# Patient Record
Sex: Male | Born: 1965
Health system: Southern US, Community
[De-identification: ages and names within clinical notes are randomized; demographics above are authoritative.]

## PROBLEM LIST (undated history)

## (undated) DIAGNOSIS — Z9989 Dependence on other enabling machines and devices: Secondary | ICD-10-CM

## (undated) DIAGNOSIS — Z8614 Personal history of Methicillin resistant Staphylococcus aureus infection: Secondary | ICD-10-CM

## (undated) DIAGNOSIS — N209 Urinary calculus, unspecified: Secondary | ICD-10-CM

## (undated) DIAGNOSIS — G4730 Sleep apnea, unspecified: Secondary | ICD-10-CM

## (undated) DIAGNOSIS — G4733 Obstructive sleep apnea (adult) (pediatric): Secondary | ICD-10-CM

## (undated) DIAGNOSIS — K649 Unspecified hemorrhoids: Secondary | ICD-10-CM

## (undated) DIAGNOSIS — S83209A Unspecified tear of unspecified meniscus, current injury, unspecified knee, initial encounter: Secondary | ICD-10-CM

## (undated) DIAGNOSIS — T7840XA Allergy, unspecified, initial encounter: Secondary | ICD-10-CM

## (undated) DIAGNOSIS — K219 Gastro-esophageal reflux disease without esophagitis: Secondary | ICD-10-CM

## (undated) DIAGNOSIS — K824 Cholesterolosis of gallbladder: Secondary | ICD-10-CM

## (undated) HISTORY — DX: Sleep apnea, unspecified: G47.30

## (undated) HISTORY — PX: CYSTOSCOPY/RETROGRADE/URETEROSCOPY/STONE EXTRACTION WITH BASKET: SHX5317

## (undated) HISTORY — DX: Allergy, unspecified, initial encounter: T78.40XA

## (undated) HISTORY — DX: Cholesterolosis of gallbladder: K82.4

## (undated) HISTORY — DX: Obstructive sleep apnea (adult) (pediatric): G47.33

## (undated) HISTORY — DX: Gastro-esophageal reflux disease without esophagitis: K21.9

## (undated) HISTORY — DX: Urinary calculus, unspecified: N20.9

## (undated) HISTORY — DX: Unspecified hemorrhoids: K64.9

## (undated) HISTORY — DX: Obstructive sleep apnea (adult) (pediatric): Z99.89

## (undated) HISTORY — PX: OTHER SURGICAL HISTORY: SHX169

## (undated) HISTORY — DX: Unspecified tear of unspecified meniscus, current injury, unspecified knee, initial encounter: S83.209A

---

## 2002-05-25 ENCOUNTER — Emergency Department (HOSPITAL_COMMUNITY): Admission: EM | Admit: 2002-05-25 | Discharge: 2002-05-26 | Payer: Self-pay | Admitting: Emergency Medicine

## 2004-05-19 ENCOUNTER — Ambulatory Visit: Payer: Self-pay | Admitting: Internal Medicine

## 2004-06-13 ENCOUNTER — Ambulatory Visit: Payer: Self-pay | Admitting: Gastroenterology

## 2004-07-03 ENCOUNTER — Ambulatory Visit: Payer: Self-pay | Admitting: Gastroenterology

## 2004-08-01 ENCOUNTER — Ambulatory Visit: Payer: Self-pay | Admitting: Gastroenterology

## 2004-08-11 ENCOUNTER — Ambulatory Visit: Payer: Self-pay | Admitting: Gastroenterology

## 2004-09-05 ENCOUNTER — Ambulatory Visit: Payer: Self-pay | Admitting: Gastroenterology

## 2010-08-16 DIAGNOSIS — S83209A Unspecified tear of unspecified meniscus, current injury, unspecified knee, initial encounter: Secondary | ICD-10-CM

## 2010-08-16 HISTORY — DX: Unspecified tear of unspecified meniscus, current injury, unspecified knee, initial encounter: S83.209A

## 2010-09-19 ENCOUNTER — Encounter: Payer: Self-pay | Admitting: Internal Medicine

## 2010-09-19 ENCOUNTER — Ambulatory Visit (INDEPENDENT_AMBULATORY_CARE_PROVIDER_SITE_OTHER): Payer: 59 | Admitting: Internal Medicine

## 2010-09-19 VITALS — BP 128/72 | HR 115 | Temp 97.6°F | Resp 14 | Ht 74.0 in | Wt 264.2 lb

## 2010-09-19 DIAGNOSIS — N200 Calculus of kidney: Secondary | ICD-10-CM | POA: Insufficient documentation

## 2010-09-19 DIAGNOSIS — S83209A Unspecified tear of unspecified meniscus, current injury, unspecified knee, initial encounter: Secondary | ICD-10-CM

## 2010-09-19 DIAGNOSIS — N209 Urinary calculus, unspecified: Secondary | ICD-10-CM

## 2010-09-19 DIAGNOSIS — Z136 Encounter for screening for cardiovascular disorders: Secondary | ICD-10-CM

## 2010-09-19 DIAGNOSIS — Z0000 Encounter for general adult medical examination without abnormal findings: Secondary | ICD-10-CM

## 2010-09-19 NOTE — Patient Instructions (Addendum)
Please come back fasting within a week: Udip and UA, FLP CMP CBC TSH---dx V70

## 2010-09-19 NOTE — Assessment & Plan Note (Signed)
To have surgery on the knee

## 2010-09-19 NOTE — Progress Notes (Signed)
  Subjective:    Patient ID: Mario Jimenez, male    DOB: [birth year], 45 y.o.   MRN: [id number]  HPI CPX, new pt  To have knee surgery soon  Past Medical History  Diagnosis Date  . Urolithiasis 2007-2008    ([place] left kidney  3 surgeries ; has passed stones since then   . Meniscus tear 8-12    to have surgery   Past Surgical History  Procedure Date  . Leg surgery 1977    Left , s/p traction, body cast   History   Social History  . Marital Status: Single    Spouse Name: N/A    Number of Children: 0  . Years of Education: N/A   Occupational History  . works for [employer]  [employer])    Social History Main Topics  . Smoking status: Former Smoker    Quit date: 09/19/2007  . Smokeless tobacco: Never Used  . Alcohol Use: Yes     social  . Drug Use: No  . Sexually Active: Not on file   Other Topics Concern  . Not on file   Social History Narrative   From [place], used to live in [place] x a while, get back to [place] 2009----diet: needs improvement----exercise: active at work---lives by himself    Family History  Problem Relation Age of Onset  . Diabetes Neg Hx   . Coronary artery disease      F, MI 45 y/o  . Stroke      M d/t brain tumor   . Cancer      M brain tumor  . Colon cancer      G parents , 11s?  . Prostate cancer Neg Hx       Review of Systems  Constitutional: Negative for fever. Fatigue: energy level ok , could "use more"       Occ sleepy "all life"  Respiratory: Negative for cough and shortness of breath.   Cardiovascular: Negative for chest pain and leg swelling.  Gastrointestinal: Negative for nausea, abdominal pain and blood in stool.  Genitourinary: Negative for dysuria and hematuria.  Psychiatric/Behavioral:       No depression       Objective:   Physical Exam  Constitutional: He is oriented to person, place, and time. He appears well-developed. No distress.       Overweight appearing   HENT:  Head: Normocephalic and atraumatic.  Neck: No  thyromegaly present.  Cardiovascular: Normal rate, regular rhythm and normal heart sounds.   No murmur heard. Pulmonary/Chest: Effort normal and breath sounds normal. No respiratory distress. He has no wheezes. He has no rales.  Abdominal: Soft. Bowel sounds are normal. He exhibits no distension. There is no tenderness. There is no rebound and no guarding.  Musculoskeletal: He exhibits no edema.  Neurological: He is alert and oriented to person, place, and time.  Skin: Skin is warm and dry. He is not diaphoretic.  Psychiatric: He has a normal mood and affect. His behavior is normal. Judgment and thought content normal.          Assessment & Plan:

## 2010-09-19 NOTE — Assessment & Plan Note (Addendum)
Td ~ 2006 (in [place] per pt) Reports a Cscope 2005 @ [place] (d/t diverticulitis?)--- will get records  EKG today show normal sinus rhythm, no old EKGs to compare with. Diet and exercise discussed, being I discussed as well. Definitely needs to lose weight.  We'll return to the office fasting for labs. Initially heart rate was elevated , repeated HR  in the 80s

## 2010-09-21 ENCOUNTER — Other Ambulatory Visit (INDEPENDENT_AMBULATORY_CARE_PROVIDER_SITE_OTHER): Payer: 59

## 2010-09-21 DIAGNOSIS — Z0000 Encounter for general adult medical examination without abnormal findings: Secondary | ICD-10-CM

## 2010-09-21 LAB — COMPREHENSIVE METABOLIC PANEL
ALT: 17 U/L (ref 0–53)
Albumin: 3.8 g/dL (ref 3.5–5.2)
Alkaline Phosphatase: 65 U/L (ref 39–117)
CO2: 29 mEq/L (ref 19–32)
GFR: 94.28 mL/min (ref >60.00)
Glucose, Bld: 98 mg/dL (ref 70–99)
Potassium: 4 mEq/L (ref 3.5–5.1)
Sodium: 138 mEq/L (ref 135–145)
Total Protein: 7.1 g/dL (ref 6.0–8.3)

## 2010-09-21 LAB — POCT URINALYSIS DIPSTICK
Leukocytes, UA: NEGATIVE
Protein, UA: NEGATIVE
Urobilinogen, UA: 0.2

## 2010-09-21 LAB — CBC WITH DIFFERENTIAL/PLATELET
Basophils Absolute: 0 10*3/uL (ref 0.0–0.1)
Eosinophils Relative: 2.6 % (ref 0.0–5.0)
Lymphs Abs: 2.6 10*3/uL (ref 0.7–4.0)
MCV: 89.1 fl (ref 78.0–100.0)
Monocytes Absolute: 0.4 10*3/uL (ref 0.1–1.0)
Neutrophils Relative %: 64.5 % (ref 43.0–77.0)
Platelets: 172 10*3/uL (ref 150.0–400.0)
RDW: 13.7 % (ref 11.5–14.6)
WBC: 9.4 10*3/uL (ref 4.5–10.5)

## 2010-09-21 LAB — LIPID PANEL: Total CHOL/HDL Ratio: 4

## 2010-09-21 LAB — LDL CHOLESTEROL, DIRECT: Direct LDL: 81.2 mg/dL

## 2010-09-21 LAB — TSH: TSH: 1.72 u[IU]/mL (ref 0.35–5.50)

## 2010-09-21 NOTE — Progress Notes (Signed)
Labs only

## 2010-09-23 LAB — CULTURE, URINE COMPREHENSIVE: Colony Count: NO GROWTH

## 2010-12-01 ENCOUNTER — Encounter (HOSPITAL_BASED_OUTPATIENT_CLINIC_OR_DEPARTMENT_OTHER): Payer: Self-pay | Admitting: *Deleted

## 2010-12-01 NOTE — Progress Notes (Signed)
NPO AFTER NPO. PT NEEDS HG. CURRENT EKG IN EPIC. PT TO ARRIVE AT 1030

## 2010-12-05 ENCOUNTER — Ambulatory Visit (HOSPITAL_BASED_OUTPATIENT_CLINIC_OR_DEPARTMENT_OTHER): Payer: 59 | Admitting: Anesthesiology

## 2010-12-05 ENCOUNTER — Ambulatory Visit (HOSPITAL_BASED_OUTPATIENT_CLINIC_OR_DEPARTMENT_OTHER)
Admission: RE | Admit: 2010-12-05 | Discharge: 2010-12-05 | Disposition: A | Discharge status: Home / Self Care | Payer: 59 | Source: Ambulatory Visit | Attending: Orthopedic Surgery | Admitting: Orthopedic Surgery

## 2010-12-05 ENCOUNTER — Encounter (HOSPITAL_BASED_OUTPATIENT_CLINIC_OR_DEPARTMENT_OTHER): Payer: Self-pay | Admitting: *Deleted

## 2010-12-05 ENCOUNTER — Encounter (HOSPITAL_BASED_OUTPATIENT_CLINIC_OR_DEPARTMENT_OTHER): Payer: Self-pay | Admitting: Anesthesiology

## 2010-12-05 ENCOUNTER — Encounter (HOSPITAL_BASED_OUTPATIENT_CLINIC_OR_DEPARTMENT_OTHER)
Admission: RE | Disposition: A | Discharge status: Home / Self Care | Payer: Self-pay | Source: Ambulatory Visit | Attending: Orthopedic Surgery

## 2010-12-05 DIAGNOSIS — M659 Unspecified synovitis and tenosynovitis, unspecified site: Secondary | ICD-10-CM | POA: Insufficient documentation

## 2010-12-05 DIAGNOSIS — M23319 Other meniscus derangements, anterior horn of medial meniscus, unspecified knee: Secondary | ICD-10-CM | POA: Insufficient documentation

## 2010-12-05 DIAGNOSIS — S83259A Bucket-handle tear of lateral meniscus, current injury, unspecified knee, initial encounter: Secondary | ICD-10-CM | POA: Insufficient documentation

## 2010-12-05 DIAGNOSIS — M1710 Unilateral primary osteoarthritis, unspecified knee: Secondary | ICD-10-CM | POA: Insufficient documentation

## 2010-12-05 HISTORY — DX: Personal history of Methicillin resistant Staphylococcus aureus infection: Z86.14

## 2010-12-05 HISTORY — PX: KNEE ARTHROSCOPY: SHX127

## 2010-12-05 LAB — POCT HEMOGLOBIN-HEMACUE: Hemoglobin: 15.5 g/dL (ref 13.0–17.0)

## 2010-12-05 SURGERY — ARTHROSCOPY, KNEE
Anesthesia: General | Site: Knee | Laterality: Left | Wound class: Clean

## 2010-12-05 MED ORDER — OXYCODONE-ACETAMINOPHEN 5-325 MG PO TABS
1.0000 | ORAL_TABLET | ORAL | Status: DC | PRN
  Administered 2010-12-05: 1 via ORAL

## 2010-12-05 MED ORDER — SODIUM CHLORIDE 0.9 % IR SOLN
Status: DC | PRN
  Administered 2010-12-05: 14:00:00

## 2010-12-05 MED ORDER — BUPIVACAINE-EPINEPHRINE 0.5% -1:200000 IJ SOLN
INTRAMUSCULAR | Status: DC | PRN
  Administered 2010-12-05: 20 mL

## 2010-12-05 MED ORDER — PROMETHAZINE HCL 25 MG/ML IJ SOLN: 6.2500 mg | INTRAMUSCULAR | Status: DC | PRN

## 2010-12-05 MED ORDER — MIDAZOLAM HCL 5 MG/5ML IJ SOLN
INTRAMUSCULAR | Status: DC | PRN
  Administered 2010-12-05 (×2): 1 mg via INTRAVENOUS

## 2010-12-05 MED ORDER — PROMETHAZINE HCL 12.5 MG PO TABS: 12.5000 mg | ORAL_TABLET | Freq: Four times a day (QID) | ORAL | Status: AC | PRN

## 2010-12-05 MED ORDER — SODIUM CHLORIDE 0.9 % IR SOLN
Status: DC | PRN
  Administered 2010-12-05: 3000 mL

## 2010-12-05 MED ORDER — PROPOFOL 10 MG/ML IV EMUL
INTRAVENOUS | Status: DC | PRN
  Administered 2010-12-05: 200 mg via INTRAVENOUS

## 2010-12-05 MED ORDER — LACTATED RINGERS IV SOLN
INTRAVENOUS | Status: DC
  Administered 2010-12-05 (×4): via INTRAVENOUS

## 2010-12-05 MED ORDER — ALBUTEROL SULFATE HFA 108 (90 BASE) MCG/ACT IN AERS
INHALATION_SPRAY | RESPIRATORY_TRACT | Status: DC | PRN
  Administered 2010-12-05: 1 via RESPIRATORY_TRACT
  Administered 2010-12-05: 2 via RESPIRATORY_TRACT

## 2010-12-05 MED ORDER — LIDOCAINE HCL (CARDIAC) 20 MG/ML IV SOLN
INTRAVENOUS | Status: DC | PRN
  Administered 2010-12-05: 60 mg via INTRAVENOUS

## 2010-12-05 MED ORDER — FENTANYL CITRATE 0.05 MG/ML IJ SOLN
25.0000 ug | INTRAMUSCULAR | Status: DC | PRN
  Administered 2010-12-05: 25 ug via INTRAVENOUS
  Administered 2010-12-05: 50 ug via INTRAVENOUS
  Administered 2010-12-05: 25 ug via INTRAVENOUS

## 2010-12-05 MED ORDER — OXYCODONE-ACETAMINOPHEN 7.5-325 MG PO TABS: 1.0000 | ORAL_TABLET | ORAL | Status: AC | PRN

## 2010-12-05 MED ORDER — FENTANYL CITRATE 0.05 MG/ML IJ SOLN
INTRAMUSCULAR | Status: DC | PRN
  Administered 2010-12-05 (×2): 25 ug via INTRAVENOUS
  Administered 2010-12-05 (×3): 50 ug via INTRAVENOUS

## 2010-12-05 MED ORDER — DEXAMETHASONE SODIUM PHOSPHATE 4 MG/ML IJ SOLN
INTRAMUSCULAR | Status: DC | PRN
  Administered 2010-12-05: 8 mg via INTRAVENOUS

## 2010-12-05 SURGICAL SUPPLY — 34 items
BANDAGE ELASTIC 6 VELCRO ST LF (GAUZE/BANDAGES/DRESSINGS) ×2 IMPLANT
BANDAGE ESMARK 6X9 LF (GAUZE/BANDAGES/DRESSINGS) ×1 IMPLANT
BANDAGE GAUZE ELAST BULKY 4 IN (GAUZE/BANDAGES/DRESSINGS) ×2 IMPLANT
BLADE CUDA SHAVER 3.5 (BLADE) ×2 IMPLANT
BNDG ESMARK 6X9 LF (GAUZE/BANDAGES/DRESSINGS) ×2
CANISTER SUCT LVC 12 LTR MEDI- (MISCELLANEOUS) ×2 IMPLANT
CLOTH BEACON ORANGE TIMEOUT ST (SAFETY) ×2 IMPLANT
DRAPE ARTHROSCOPY W/POUCH 114 (DRAPES) ×2 IMPLANT
DRAPE LG THREE QUARTER DISP (DRAPES) ×2 IMPLANT
DRSG EMULSION OIL 3X3 NADH (GAUZE/BANDAGES/DRESSINGS) ×2 IMPLANT
DURAPREP 26ML APPLICATOR (WOUND CARE) ×2 IMPLANT
ELECT REM PT RETURN 9FT ADLT (ELECTROSURGICAL) ×2
ELECTRODE REM PT RTRN 9FT ADLT (ELECTROSURGICAL) ×1 IMPLANT
GLOVE BIOGEL PI IND STRL 6.5 (GLOVE) ×1 IMPLANT
GLOVE BIOGEL PI INDICATOR 6.5 (GLOVE) ×1
GLOVE ECLIPSE 8.0 STRL XLNG CF (GLOVE) ×2 IMPLANT
GLOVE INDICATOR 8.0 STRL GRN (GLOVE) ×2 IMPLANT
GOWN W/COTTON TOWEL STD LRG (GOWNS) ×2 IMPLANT
GOWN XL W/COTTON TOWEL STD (GOWNS) ×2 IMPLANT
IV NS IRRIG 3000ML ARTHROMATIC (IV SOLUTION) ×6 IMPLANT
KNEE WRAP E Z 3 GEL PACK (MISCELLANEOUS) ×2 IMPLANT
NDL SAFETY ECLIPSE 18X1.5 (NEEDLE) ×1 IMPLANT
NEEDLE HYPO 18GX1.5 BLUNT FILL (NEEDLE) ×2 IMPLANT
NEEDLE HYPO 18GX1.5 SHARP (NEEDLE) ×1
PACK ARTHROSCOPY DSU (CUSTOM PROCEDURE TRAY) ×2 IMPLANT
PACK BASIN DAY SURGERY FS (CUSTOM PROCEDURE TRAY) ×2 IMPLANT
PADDING CAST ABS 4INX4YD NS (CAST SUPPLIES) ×1
PADDING CAST ABS COTTON 4X4 ST (CAST SUPPLIES) ×1 IMPLANT
SET ARTHROSCOPY TUBING (MISCELLANEOUS) ×2
SET ARTHROSCOPY TUBING LN (MISCELLANEOUS) ×1 IMPLANT
SPONGE GAUZE 4X4 12PLY (GAUZE/BANDAGES/DRESSINGS) ×2 IMPLANT
SYRINGE 10CC LL (SYRINGE) ×2 IMPLANT
TOWEL OR 17X24 6PK STRL BLUE (TOWEL DISPOSABLE) ×2 IMPLANT
WATER STERILE IRR 500ML POUR (IV SOLUTION) ×2 IMPLANT

## 2010-12-05 NOTE — H&P (Signed)
Mario Jimenez is an 45 y.o. male.   Chief Complaint: pain lt knee  HPI: mri--lateral meniscus  Past Medical History  Diagnosis Date  . Urolithiasis 2007-2008    [place]  3 surgeries ; has passed stones since then   . Meniscus tear 8-12    to have surgery-- left knee  . Reflux occasional    watches diet and takes diet  . Asthma mild    no inhaler  . History of MRSA infection few yrs ago while in hospital in [place]    Past Surgical History  Procedure Date  . Leg surgery 1977- age 59    Left , s/p traction, body cast  . Cystoscopy/retrograde/ureteroscopy/stone extraction with basket 2007-- x2    Family History  Problem Relation Age of Onset  . Diabetes Neg Hx   . Coronary artery disease      F, MI 45 y/o  . Stroke      M d/t brain tumor   . Cancer      M brain tumor  . Colon cancer      G parents , 33s?  . Prostate cancer Neg Hx    Social History:  reports that he quit smoking about 3 years ago. He has never used smokeless tobacco. He reports that he drinks alcohol. He reports that he does not use illicit drugs.  Allergies:  Allergies  Allergen Reactions  . Contrast Media (Iodinated Diagnostic Agents) Hives    Medications Prior to Admission  Medication Dose Route Frequency Provider Last Rate Last Dose  . lactated ringers infusion   Intravenous Continuous Jill Side, MD       No current outpatient prescriptions on file as of 12/05/2010.    Results for orders placed during the hospital encounter of 12/05/10 (from the past 48 hour(s))  POCT HEMOGLOBIN-HEMACUE     Status: Normal   Collection Time   12/05/10 11:11 AM      Component Value Range Comment   Hemoglobin 15.5  13.0 - 17.0 (g/dL)    No results found.  ROS  Blood pressure 123/77, pulse 85, temperature 97.7 F (36.5 C), temperature source Oral, resp. rate 18, height 6\' 2"  (1.88 m), weight 117.935 kg (260 lb), SpO2 97.00%. Physical Exam  Constitutional: He is oriented to person, place,  and time. He appears well-developed and well-nourished.  HENT:  Head: Normocephalic.  Right Ear: External ear normal.  Left Ear: External ear normal.  Nose: Nose normal.  Eyes: Pupils are equal, round, and reactive to light.  Neck: Normal range of motion. Neck supple.  Cardiovascular: Normal rate, regular rhythm, normal heart sounds and intact distal pulses.   Respiratory: Effort normal and breath sounds normal.  GI: Soft. Bowel sounds are normal.  Musculoskeletal:       Tender lateral joint line lt knee  Neurological: He is alert and oriented to person, place, and time.  Skin: Skin is warm and dry.  Psychiatric: He has a normal mood and affect. His behavior is normal. Judgment and thought content normal.     Assessment/Plan Torn lateral meniscus lt knee Lt knee arthroscopy  Aysa Larivee P 12/05/2010, 1:05 PM

## 2010-12-05 NOTE — Anesthesia Postprocedure Evaluation (Signed)
  Anesthesia Post-op Note  Patient: Mario Jimenez  Procedure(s) Performed:  ARTHROSCOPY KNEE - LEFT KNEE ARTHROSCOPY WITH PARTIAL MEDIAL  MENISECTOMY, Total lateral menisectomy, Shaving of Medial Femoral condyle  Patient Location: PACU  Anesthesia Type: General  Level of Consciousness: awake and alert   Airway and Oxygen Therapy: Patient Spontanous Breathing  Post-op Pain: mild  Post-op Assessment: Post-op Vital signs reviewed, Patient's Cardiovascular Status Stable, Respiratory Function Stable, Patent Airway and No signs of Nausea or vomiting  Post-op Vital Signs: stable  Complications: No apparent anesthesia complications

## 2010-12-05 NOTE — Anesthesia Preprocedure Evaluation (Signed)
Anesthesia Evaluation  Patient identified by MRN, date of birth, ID band Patient awake    Reviewed: Allergy & Precautions, H&P , NPO status , Patient's Chart, lab work & pertinent test results, reviewed documented beta blocker date and time   Airway Mallampati: II TM Distance: >3 FB Neck ROM: Full    Dental No notable dental hx.    Pulmonary neg pulmonary ROS,  clear to auscultation        Cardiovascular neg cardio ROS Regular Normal Denies cardiac symptoms   Neuro/Psych Negative Neurological ROS  Negative Psych ROS   GI/Hepatic negative GI ROS, Neg liver ROS,   Endo/Other  obesity  Renal/GU negative Renal ROS  Genitourinary negative   Musculoskeletal negative musculoskeletal ROS (+)   Abdominal   Peds negative pediatric ROS (+)  Hematology negative hematology ROS (+)   Anesthesia Other Findings   Reproductive/Obstetrics negative OB ROS                           Anesthesia Physical Anesthesia Plan  ASA: II  Anesthesia Plan: General   Post-op Pain Management:    Induction: Intravenous  Airway Management Planned: LMA  Additional Equipment:   Intra-op Plan:   Post-operative Plan: Extubation in OR  Informed Consent: I have reviewed the patients History and Physical, chart, labs and discussed the procedure including the risks, benefits and alternatives for the proposed anesthesia with the patient or authorized representative who has indicated his/her understanding and acceptance.     Plan Discussed with: CRNA and Surgeon  Anesthesia Plan Comments:         Anesthesia Quick Evaluation

## 2010-12-05 NOTE — Anesthesia Procedure Notes (Signed)
Procedure Name: LMA Insertion Date/Time: 12/05/2010 1:22 PM Performed by: Lorrin Jackson Pre-anesthesia Checklist: Patient identified, Emergency Drugs available, Suction available and Patient being monitored Patient Re-evaluated:Patient Re-evaluated prior to inductionOxygen Delivery Method: Circle System Utilized Preoxygenation: Pre-oxygenation with 100% oxygen Intubation Type: IV induction Ventilation: Mask ventilation without difficulty LMA: LMA inserted LMA Size: 4.0 Number of attempts: 1 Airway Equipment and Method: bite block Placement Confirmation: positive ETCO2 Tube secured with: Tape Dental Injury: Teeth and Oropharynx as per pre-operative assessment  Comments: Inserted by dr. Shireen Quan

## 2010-12-05 NOTE — Brief Op Note (Signed)
12/05/2010  2:30 PM  PATIENT:  Mario Jimenez  44 y.o. male  PRE-OPERATIVE DIAGNOSIS:  LEFT KNEE TORN LATERAL MENISCUS  POST-OPERATIVE DIAGNOSIS:  LEFT KNEE TORN LATERAL MENISCUS, Left torn medial meniscus  PROCEDURE:  Procedure(s): ARTHROSCOPY KNEE  SURGEON:  Surgeon(s): James P Aplington  PHYSICIAN ASSISTANT:   ASSISTANTS: none   ANESTHESIA:   general  EBL:  Total I/O In: 1000 [I.V.:1000] Out: -   BLOOD ADMINISTERED:none  DRAINS: none   LOCAL MEDICATIONS USED:  MARCAINE 20CC  SPECIMEN:  No Specimen  DISPOSITION OF SPECIMEN:  N/A  COUNTS:  YES  TOURNIQUET:   Total Tourniquet Time Documented: Thigh (Left) - 0 minutes  DICTATION: .Other Dictation: Dictation Number [id number]  PLAN OF CARE: Discharge to home after PACU  PATIENT DISPOSITION:  PACU - hemodynamically stable.   Delay start of Pharmacological VTE agent (>24hrs) due to surgical blood loss or risk of bleeding:  {YES/NO/NOT APPLICABLE:20182

## 2010-12-05 NOTE — Transfer of Care (Signed)
Immediate Anesthesia Transfer of Care Note  Patient: Mario Jimenez  Procedure(s) Performed:  ARTHROSCOPY KNEE - LEFT KNEE ARTHROSCOPY WITH PARTIAL MEDIAL  MENISECTOMY, Total lateral menisectomy, Shaving of Medial Femoral condyle  Patient Location: Patient transported to PACU with oxygen via face mask at 6 Liters / Min  Anesthesia Type: General  Level of Consciousness: awake and alert   Airway & Oxygen Therapy: Patient Spontanous Breathing and Patient connected to face mask oxygen Post-op Assessment: Report given to PACU RN and Post -op Vital signs reviewed and stable  Post vital signs: Reviewed and stable  Complications: No apparent anesthesia complications

## 2010-12-06 NOTE — Op Note (Signed)
NAMENEWTON, FRUTIGER                ACCOUNT NO.:  [id number]  MEDICAL RECORD NO.:  [id number]  LOCATION:                                 FACILITY:  PHYSICIAN:  Marlowe Kays, M.D.  DATE OF BIRTH:  [birth year]  DATE OF PROCEDURE:  12/05/2010 DATE OF DISCHARGE:                              OPERATIVE REPORT   PREOPERATIVE DIAGNOSES: 1. Torn lateral meniscus. 2. Osteoarthritis, left knee.  POSTOPERATIVE DIAGNOSES: 1. Torn lateral meniscus. 2. Osteoarthritis, left knee. 3. Torn medial meniscus, left knee.  OPERATION:  Left knee arthroscopy with: 1. Partial medial and lateral meniscectomies. 2. Shaving of lateral femoral condyle.  SURGEON:  Marlowe Kays, M.D.  ASSISTANT:  Nurse.  ANESTHESIA:  General.  PATHOLOGY JUSTIFICATION FOR PROCEDURE:  Because of painful left knee, had an MRI performed on August 31, 2010, which demonstrated the preoperative diagnoses.  At surgery, it was also noted to have fraying of the inner anterior 3rd of the medial meniscus.  Basically, had no arthritic changes in the medial compartment, some minimal flattening of the lateral facet of the patella and some grade 1 to 2 chondromalacia of the lateral femoral condyle.  The lateral meniscus tear, however, was essentially a double bucket-handle tear as described below.  PROCEDURE IN DETAIL:  Satisfactory general anesthesia, Ace wrap, and knee support to right lower extremity, pneumatic tourniquet applied to left lower extremity with the leg Esmarch'd out nonsterilely. Tourniquet inflated to 350 mmHg and thigh stabilizer applied.  Left leg was then prepped with DuraPrep from stabilizer and ankle, draped in sterile field.  Time-out performed.  Superior and medial saline inflow, first through an anterolateral portal, medial compartment knee joint was evaluated with the findings noted above.  After evaluating the medial compartment of knee joint, I shaved the anterior third of the medial meniscus  __________ smooth with 3.5 shaver.  Pre and post films were taken.  __________ was noted to be intact.  Looking at the medial gutter and suprapatellar area, no surgery was indicated with the findings noted above.  I then reversed portals.  He had a good bit of synovitis, which I cleared with 3.5 shaver, and the remnant of the anterior horn, which was still attached, he had an early bucket-handle type tear, and then just deeper to this, a frank bucket-handle type tear.  It almost looked like he had a discoid lateral meniscus.  After evaluating the complexity of the tear, I took small arthroscopic scissors and detached the anterior portion of the lateral meniscus from the anterior bucket-handle portion back to the residual rim and in junction of mid and posterior 3rd.  I then removed the anterior portion of this complex with a combination of 3.5 shaver and baskets, leaving a large fragment in the posterior intercondylar area.  I approached this by using an additional accessory portal and pulling on the freed up anterior portion of the lateral meniscus with the scrub nurse holding here, I used arthroscopic scissors to cut the posterior intercondylar root.  This fragment was then removed in total with a low additional trimming up __________ baskets of the remaining stump.  This basically performed almost a total lateral meniscectomy.  On probing,  there was no residual meniscus, loose to come into the joint.  I minimally shaved the lateral femoral condyle and wound was then irrigated with sterile saline.  All fluid possible removed.  I closed all portals except for the inflow with interrupted 4- 0 nylon mattress sutures.  I then injected through the inflow apparatus, 20 cc of 0.5% Marcaine with adrenaline, 4 mg of morphine, and closed this portal with 4-0 nylon as well.  Betadine, Adaptic, dry sterile dressing were applied.  Tourniquet was released.  At the time of this dictation, he was on his  way to recovery room in satisfied condition with no known complications.          ______________________________ Marlowe Kays, M.D.     JA/MEDQ  D:  12/05/2010  T:  12/05/2010  Job:  [id number]

## 2010-12-12 ENCOUNTER — Encounter (HOSPITAL_BASED_OUTPATIENT_CLINIC_OR_DEPARTMENT_OTHER): Payer: Self-pay | Admitting: Orthopedic Surgery

## 2011-01-15 ENCOUNTER — Encounter: Payer: Self-pay | Admitting: Internal Medicine

## 2011-01-15 ENCOUNTER — Ambulatory Visit (INDEPENDENT_AMBULATORY_CARE_PROVIDER_SITE_OTHER): Payer: 59 | Admitting: Internal Medicine

## 2011-01-15 VITALS — BP 106/68 | HR 87 | Temp 98.0°F | Wt 268.2 lb

## 2011-01-15 DIAGNOSIS — J069 Acute upper respiratory infection, unspecified: Secondary | ICD-10-CM

## 2011-01-15 MED ORDER — MOMETASONE FUROATE 50 MCG/ACT NA SUSP: 2.0000 | Freq: Every day | NASAL | Status: DC

## 2011-01-15 MED ORDER — AMOXICILLIN 500 MG PO CAPS: 1000.0000 mg | ORAL_CAPSULE | Freq: Two times a day (BID) | ORAL | Status: AC

## 2011-01-15 NOTE — Patient Instructions (Signed)
Rest, fluids , tylenol For congestion or cough, take Mucinex DM twice a day as needed  Start nasonex (sample) 2 sprays on each side of the nose daily (it can be use long term) If not improving in the next few days, start amoxicillin Call if no better in 10 days Call anytime if the symptoms are severe

## 2011-01-15 NOTE — Progress Notes (Signed)
  Subjective:    Patient ID: Mario Jimenez, male    DOB: 09/16/1965, 45 y.o.   MRN: [id number]  HPI  acute visit, Symptoms started 5 days ago with sneezing, sinus pressure, feeling sleepy,green nasal discharge in the morning, clear discharge the rest of the day.   Past Medical History  Diagnosis Date  . Urolithiasis 2007-2008    ([place]  3 surgeries ; has passed stones since then   . Meniscus tear 8-12    to have surgery-- left knee  . Reflux occasional    watches diet and takes diet  . Asthma mild    no inhaler  . History of MRSA infection few yrs ago while in hospital in [place]   Past Surgical History  Procedure Date  . Leg surgery 1977- age 31    Left , s/p traction, body cast  . Cystoscopy/retrograde/ureteroscopy/stone extraction with basket 2007-- x2  . Knee arthroscopy 12/05/2010    Procedure: ARTHROSCOPY KNEE;  Surgeon: Illene Labrador Aplington;  Location: [facility];  Service: Orthopedics;  Laterality: Left;  LEFT KNEE ARTHROSCOPY WITH PARTIAL MEDIAL  MENISECTOMY, Total lateral menisectomy, Shaving of Medial Femoral condyle      Review of Systems No fever chills, some cough in the morning. No chest symptoms. Mild sore throat No nausea, vomiting, diarrhea. No myalgias.    Objective:   Physical Exam  Constitutional: He appears well-developed and well-nourished.  HENT:  Head: Normocephalic and atraumatic.  Right Ear: External ear normal.       Left tympanic membrane with mild redness?Marland Kitchen No bulging or discharge. Nose congested, voice nasal, face symmetric and nontender to palpation. Throat with mild redness and no d/c  Cardiovascular: Normal rate, regular rhythm and normal heart sounds.   No murmur heard. Pulmonary/Chest: Effort normal and breath sounds normal. No respiratory distress. He has no wheezes. He has no rales.       Assessment & Plan:  URI:  URI with abundant nasal congestion , early otitis media?. see instructions.

## 2011-01-16 ENCOUNTER — Encounter: Payer: Self-pay | Admitting: Internal Medicine

## 2011-08-16 ENCOUNTER — Encounter: Payer: Self-pay | Admitting: Internal Medicine

## 2011-08-16 ENCOUNTER — Ambulatory Visit (INDEPENDENT_AMBULATORY_CARE_PROVIDER_SITE_OTHER): Payer: BC Managed Care – PPO | Admitting: Internal Medicine

## 2011-08-16 VITALS — BP 122/84 | HR 77 | Temp 98.1°F | Wt 264.0 lb

## 2011-08-16 DIAGNOSIS — K649 Unspecified hemorrhoids: Secondary | ICD-10-CM | POA: Insufficient documentation

## 2011-08-16 MED ORDER — HYDROCORTISONE ACE-PRAMOXINE 1-1 % RE FOAM: 1.0000 | Freq: Two times a day (BID) | RECTAL | Status: AC

## 2011-08-16 NOTE — Patient Instructions (Addendum)
Use ProctoFoam for few days Avoid constipation, eat lots of fruits and vegetables, if you feel constipated, use milk of magnesia. If you have pain, you can use a over-the-counter ointment call Nupercainal. Bath sitz, call anytime if you have severe pain, increased bleeding.    Hemorrhoids Hemorrhoids are enlarged (dilated) veins around the rectum. There are 2 types of hemorrhoids, and the type of hemorrhoid is determined by its location. Internal hemorrhoids occur in the veins just inside the rectum.They are usually not painful, but they may bleed.However, they may poke through to the outside and become irritated and painful. External hemorrhoids involve the veins outside the anus and can be felt as a painful swelling or hard lump near the anus.They are often itchy and may crack and bleed. Sometimes clots will form in the veins. This makes them swollen and painful. These are called thrombosed hemorrhoids. CAUSES Causes of hemorrhoids include:  Pregnancy. This increases the pressure in the hemorrhoidal veins.   Constipation.   Straining to have a bowel movement.   Obesity.   Heavy lifting or other activity that caused you to strain.  TREATMENT Most of the time hemorrhoids improve in 1 to 2 weeks. However, if symptoms do not seem to be getting better or if you have a lot of rectal bleeding, your caregiver may perform a procedure to help make the hemorrhoids get smaller or remove them completely.Possible treatments include:  Rubber band ligation. A rubber band is placed at the base of the hemorrhoid to cut off the circulation.   Sclerotherapy. A chemical is injected to shrink the hemorrhoid.   Infrared light therapy. Tools are used to burn the hemorrhoid.   Hemorrhoidectomy. This is surgical removal of the hemorrhoid.  HOME CARE INSTRUCTIONS   Increase fiber in your diet. Ask your caregiver about using fiber supplements.   Drink enough water and fluids to keep your urine clear or  pale yellow.   Exercise regularly.   Go to the bathroom when you have the urge to have a bowel movement. Do not wait.   Avoid straining to have bowel movements.   Keep the anal area dry and clean.   Only take over-the-counter or prescription medicines for pain, discomfort, or fever as directed by your caregiver.  If your hemorrhoids are thrombosed:  Take warm sitz baths for 20 to 30 minutes, 3 to 4 times per day.   If the hemorrhoids are very tender and swollen, place ice packs on the area as tolerated. Using ice packs between sitz baths may be helpful. Fill a plastic bag with ice. Place a towel between the bag of ice and your skin.   Medicated creams and suppositories may be used or applied as directed.   Do not use a donut-shaped pillow or sit on the toilet for long periods. This increases blood pooling and pain.  SEEK MEDICAL CARE IF:   You have increasing pain and swelling that is not controlled with your medicine.   You have uncontrolled bleeding.   You have difficulty or you are unable to have a bowel movement.   You have pain or inflammation outside the area of the hemorrhoids.   You have chills or an oral temperature above 102 F (38.9 C).  MAKE SURE YOU:   Understand these instructions.   Will watch your condition.   Will get help right away if you are not doing well or get worse.  Document Released: 12/30/1999 Document Revised: 12/21/2010 Document Reviewed: 05/06/2007 [employer] Patient Information 2012  [employer]

## 2011-08-16 NOTE — Progress Notes (Signed)
  Subjective:    Patient ID: Mario Jimenez, male    DOB: 1965/08/11, 46 y.o.   MRN: [id number]  HPI Acute visit Patient got constipated last week, constipation has improved this week however his hemorrhoids got exacerbated: Noted a few drops of red blood in the toilet paper for the last couple of days, also a feeling of burning in that area.  Past Medical History  Diagnosis Date  . Urolithiasis 2007-2008    ([place] left [place]  3 surgeries ; has passed stones since then   . Meniscus tear 8-12    to have surgery-- left knee  . Reflux occasional    watches diet and takes diet  . Asthma mild    no inhaler  . History of MRSA infection few yrs ago while in hospital in [place]     Review of Systems Denies abdominal pain, nausea or vomiting. No fever or chills.     Objective:   Physical Exam  General -- alert, well-developed, and overweight appearing. No apparent distress.  Abdomen--soft, non-tender, no distention, no masses, no HSM, no guarding, and no rigidity.   DRE-- has a 2.5x1 cm external hemorrhoid, slightly tender, and no active bleeding, does not seem to be thrombosed. Otherwise, no mass at the rectum Anoscopy: Has few internal hemorrhoids, no active bleeding, they on at 5 oclock seems slightly erythematous. Psych-- Cognition and judgment appear intact. Alert and cooperative with normal attention span and concentration.  not anxious appearing and not depressed appearing.        Assessment & Plan:

## 2011-08-16 NOTE — Assessment & Plan Note (Addendum)
Long  history of hemorrhoids, exacerbated for few days, he has been asymptomatic for many years until now. Exacerbation likely due to recent constipation. He has both internal and external hemorrhoids, external hemorrhoid seems to be more irritated at this time. Plan: ProctoFoam which historically has helped the best. See instructions

## 2011-09-21 ENCOUNTER — Ambulatory Visit (INDEPENDENT_AMBULATORY_CARE_PROVIDER_SITE_OTHER): Payer: BC Managed Care – PPO | Admitting: Internal Medicine

## 2011-09-21 ENCOUNTER — Encounter: Payer: Self-pay | Admitting: Internal Medicine

## 2011-09-21 VITALS — BP 118/74 | HR 82 | Temp 98.1°F | Ht 74.0 in | Wt 261.0 lb

## 2011-09-21 DIAGNOSIS — Z23 Encounter for immunization: Secondary | ICD-10-CM

## 2011-09-21 DIAGNOSIS — K649 Unspecified hemorrhoids: Secondary | ICD-10-CM

## 2011-09-21 DIAGNOSIS — Z0000 Encounter for general adult medical examination without abnormal findings: Secondary | ICD-10-CM

## 2011-09-21 DIAGNOSIS — B070 Plantar wart: Secondary | ICD-10-CM

## 2011-09-21 LAB — CBC WITH DIFFERENTIAL/PLATELET
Basophils Relative: 0.4 % (ref 0.0–3.0)
Eosinophils Relative: 2.4 % (ref 0.0–5.0)
Hemoglobin: 15.1 g/dL (ref 13.0–17.0)
Lymphocytes Relative: 23.7 % (ref 12.0–46.0)
Monocytes Relative: 4.3 % (ref 3.0–12.0)
Neutro Abs: 7.2 10*3/uL (ref 1.4–7.7)
RBC: 5.03 Mil/uL (ref 4.22–5.81)
WBC: 10.5 10*3/uL (ref 4.5–10.5)

## 2011-09-21 LAB — TSH: TSH: 1.59 u[IU]/mL (ref 0.35–5.50)

## 2011-09-21 NOTE — Patient Instructions (Signed)
Exercise at least 3 hours a week, you can break it down to 30 minutes 6 times a week.

## 2011-09-21 NOTE — Assessment & Plan Note (Signed)
Responded very well to ProctoFoam, will use it as needed

## 2011-09-21 NOTE — Assessment & Plan Note (Signed)
Td ~ 2006 (in [place] per pt) and 09-2011 Reports a Cscope 2005 @ [place] (d/t diverticulitis?)---no records  Diet and exercise discussed Labs  He also has plantar warts, will refer to dermatology

## 2011-09-21 NOTE — Progress Notes (Signed)
Subjective:    Patient ID: Mario Jimenez, male    DOB: 01/19/1965, 46 y.o.   MRN: [id number]  HPI CPX  Past Medical History  Diagnosis Date  . Urolithiasis 2007-2008    [place]  3 surgeries ; has passed stones since then   . Meniscus tear 8-12    to have surgery-- left knee  . Reflux occasional    watches diet and takes diet  . Asthma mild    no inhaler  . History of MRSA infection few yrs ago while in hospital in [place]   Past Surgical History  Procedure Date  . Leg surgery 1977- age 70    Left , s/p traction, body cast  . Cystoscopy/retrograde/ureteroscopy/stone extraction with basket 2007-- x2  . Knee arthroscopy 12/05/2010    Procedure: ARTHROSCOPY KNEE;  Surgeon: Illene Labrador Aplington;  Location: [facility];  Service: Orthopedics;  Laterality: Left;  LEFT KNEE ARTHROSCOPY WITH PARTIAL MEDIAL  MENISECTOMY, Total lateral menisectomy, Shaving of Medial Femoral condyle   History   Social History  . Marital Status: Single    Spouse Name: N/A    Number of Children: 0  . Years of Education: N/A   Occupational History  . works for [employer]  [employer])    Social History Main Topics  . Smoking status: Former Smoker    Quit date: 09/19/2007  . Smokeless tobacco: Never Used  . Alcohol Use: Yes     social  . Drug Use: No  . Sexually Active: Not on file   Other Topics Concern  . Not on file   Social History Narrative   From [place], used to live in [place] x a while, get back to [place] 2009------diet: some improvement, not eating fast food as much  ----exercise: sedentary ---lives by himself    Family History  Problem Relation Age of Onset  . Diabetes Neg Hx   . Coronary artery disease      F, MI 44 y/o  . Stroke      M d/t brain tumor   . Cancer      M brain tumor  . Colon cancer      G parents , 55s?  . Prostate cancer Neg Hx      Review of Systems No chest pain or shortness or breath No nausea, vomiting, diarrhea No dysuria gross hematuria No  anxiety or depression  6 years history of "calluses" in the plantar area    Objective:   Physical Exam  General -- alert, well-developed, and overweight appearing. No apparent distress.  Lungs -- normal respiratory effort, no intercostal retractions, no accessory muscle use, and normal breath sounds.   Heart-- normal rate, regular rhythm, no murmur, and no gallop.   Abdomen--soft, non-tender, no distention, no masses, no HSM, no guarding, and no rigidity.   Extremities-- no pretibial edema bilaterally ; has a 1 cm wart on the left plantar area and few smaller ones scattered throughout both plantar areas Neurologic-- alert & oriented X3 and strength normal in all extremities. Psych-- Cognition and judgment appear intact. Alert and cooperative with normal attention span and concentration.  not anxious appearing and not depressed appearing.      Assessment & Plan:    Current Outpatient Rx  Name Route Sig Dispense Refill  . HYDROCORTISONE ACE-PRAMOXINE 1-1 % RE FOAM Rectal Place 1 applicator rectally 2 (two) times daily as needed.    Marland Kitchen LORATADINE 10 MG PO TABS Oral Take 10 mg by mouth  daily as needed.     . MOMETASONE FUROATE 50 MCG/ACT NA SUSP Nasal Place 2 sprays into the nose daily as needed.

## 2011-09-24 LAB — LIPID PANEL
HDL: 32 mg/dL — ABNORMAL LOW (ref >39.00)
Triglycerides: 112 mg/dL (ref 0.0–149.0)

## 2011-09-24 LAB — COMPREHENSIVE METABOLIC PANEL
Albumin: 4.1 g/dL (ref 3.5–5.2)
BUN: 10 mg/dL (ref 6–23)
CO2: 22 mEq/L (ref 19–32)
Calcium: 8.8 mg/dL (ref 8.4–10.5)
Chloride: 106 mEq/L (ref 96–112)
Creatinine, Ser: 0.8 mg/dL (ref 0.4–1.5)
GFR: 107.01 mL/min (ref >60.00)
Potassium: 3.9 mEq/L (ref 3.5–5.1)

## 2011-09-26 ENCOUNTER — Encounter: Payer: Self-pay | Admitting: Internal Medicine

## 2012-09-22 ENCOUNTER — Ambulatory Visit (INDEPENDENT_AMBULATORY_CARE_PROVIDER_SITE_OTHER): Payer: 59 | Admitting: Internal Medicine

## 2012-09-22 ENCOUNTER — Encounter: Payer: Self-pay | Admitting: Internal Medicine

## 2012-09-22 VITALS — BP 135/87 | HR 96 | Temp 97.6°F | Ht 74.5 in | Wt 267.8 lb

## 2012-09-22 DIAGNOSIS — K649 Unspecified hemorrhoids: Secondary | ICD-10-CM

## 2012-09-22 DIAGNOSIS — N209 Urinary calculus, unspecified: Secondary | ICD-10-CM

## 2012-09-22 DIAGNOSIS — Z0000 Encounter for general adult medical examination without abnormal findings: Secondary | ICD-10-CM

## 2012-09-22 LAB — COMPREHENSIVE METABOLIC PANEL
AST: 20 U/L (ref 0–37)
Albumin: 3.9 g/dL (ref 3.5–5.2)
Alkaline Phosphatase: 63 U/L (ref 39–117)
BUN: 13 mg/dL (ref 6–23)
Glucose, Bld: 80 mg/dL (ref 70–99)
Potassium: 3.9 mEq/L (ref 3.5–5.1)
Total Bilirubin: 0.6 mg/dL (ref 0.3–1.2)

## 2012-09-22 LAB — CBC WITH DIFFERENTIAL/PLATELET
Eosinophils Absolute: 0.2 10*3/uL (ref 0.0–0.7)
Eosinophils Relative: 2.2 % (ref 0.0–5.0)
HCT: 45.2 % (ref 39.0–52.0)
Lymphs Abs: 2.5 10*3/uL (ref 0.7–4.0)
MCHC: 34 g/dL (ref 30.0–36.0)
MCV: 87.8 fl (ref 78.0–100.0)
Monocytes Absolute: 0.4 10*3/uL (ref 0.1–1.0)
Neutrophils Relative %: 66.3 % (ref 43.0–77.0)
Platelets: 178 10*3/uL (ref 150.0–400.0)
RDW: 13.8 % (ref 11.5–14.6)

## 2012-09-22 LAB — URINALYSIS, ROUTINE W REFLEX MICROSCOPIC
Ketones, ur: NEGATIVE
Leukocytes, UA: NEGATIVE
Specific Gravity, Urine: 1.025 (ref 1.000–1.030)
Urine Glucose: NEGATIVE
pH: 5.5 (ref 5.0–8.0)

## 2012-09-22 LAB — LIPID PANEL
Cholesterol: 143 mg/dL (ref 0–200)
HDL: 33 mg/dL — ABNORMAL LOW (ref >39.00)
LDL Cholesterol: 95 mg/dL (ref 0–99)
Total CHOL/HDL Ratio: 4
Triglycerides: 76 mg/dL (ref 0.0–149.0)
VLDL: 15.2 mg/dL (ref 0.0–40.0)

## 2012-09-22 MED ORDER — HYDROCORTISONE ACE-PRAMOXINE 1-1 % RE FOAM: 1.0000 | Freq: Two times a day (BID) | RECTAL | Status: DC | PRN

## 2012-09-22 MED ORDER — MOMETASONE FUROATE 50 MCG/ACT NA SUSP: 2.0000 | Freq: Every day | NASAL | Status: DC | PRN

## 2012-09-22 NOTE — Assessment & Plan Note (Addendum)
Td ~ 2006 (in [place]) and 09-2011 Reports a Cscope 2005 @ [facility] (d/t diverticulitis?)---no records , will call GI Addendum, my nurse spoke w/ GI: pt had a colonoscopy in 2006 and is not due for another one until 2016 unless clinically indicated.   Diet and exercise discussed Labs   Reports right-sided scrotal "knot": self exam today normal, my exam is normal as well. Plan: Continue self-monitoring, will call if has a persisting swelling.

## 2012-09-22 NOTE — Assessment & Plan Note (Signed)
Reports he "passess a  stone" from time to time, see history of present illness. Plan: Check a UA

## 2012-09-22 NOTE — Progress Notes (Signed)
Subjective:    Patient ID: Mario Jimenez, male    DOB: 26-Mar-1965, 47 y.o.   MRN: [id number]  HPI Complete physical exam  Past Medical History  Diagnosis Date  . Urolithiasis 2007-2008    [place]  3 surgeries ; has passed stones since then   . Meniscus tear 8-12    to have surgery-- left knee  . Reflux occasional    watches diet and takes diet  . Asthma mild    no inhaler  . History of MRSA infection few yrs ago while in hospital in [place]  . Hemorrhoids     h/o Cscope    Past Surgical History  Procedure Laterality Date  . Leg surgery  1977- age 88    Left , s/p traction, body cast  . Cystoscopy/retrograde/ureteroscopy/stone extraction with basket  2007-- x2  . Knee arthroscopy  12/05/2010    Procedure: ARTHROSCOPY KNEE;  Surgeon: Illene Labrador Aplington;  Location: [facility];  Service: Orthopedics;  Laterality: Left;  LEFT KNEE ARTHROSCOPY WITH PARTIAL MEDIAL  MENISECTOMY, Total lateral menisectomy, Shaving of Medial Femoral condyle   History   Social History  . Marital Status: Single    Spouse Name: N/A    Number of Children: 0  . Years of Education: N/A   Occupational History  . works for [employer]  [employer])    Social History Main Topics  . Smoking status: Current Every Day Smoker    Last Attempt to Quit: 09/19/2007  . Smokeless tobacco: Never Used     Comment: 1/2 ppd  . Alcohol Use: Yes     Comment: social  . Drug Use: No  . Sexual Activity: Not on file   Other Topics Concern  . Not on file   Social History Narrative   From [place], used [place] live [place] a while, get back to [facility] 2009   lives by himself                Family History  Problem Relation Age of Onset  . Diabetes Neg Hx   . Coronary artery disease Father     F, MI 61 y/o  . Stroke Mother     M d/t brain tumor   . Cancer      M brain tumor  . Colon cancer Other     G parents , 69s?  . Prostate cancer Neg Hx      Review of Systems Diet--follows a regular  diet Exercise--does not exercise frequently d/t still having some left knee pain. For the last 2 months has noted some swelling at the right proximal  scrotum, occasional pain, swelling size varies from day to day. States that occasionally "passes a stone", when asked reports occasional left flank pain but not associated fever, chills, gross hematuria, difficulty urinating. Occasionally he does have some nausea and sweats with his symptoms. Once or twice a year sees red blood per rectum felt to be hemorrhoids. No substernal chest pain, cough, lower extremity edema. No abdominal pain or unexplained weight loss.    Objective:   Physical Exam  Genitourinary:      General -- alert, well-developed, NAD.  Neck --no thyromegaly , normal carotid pulse  Lungs -- normal respiratory effort, no intercostal retractions, no accessory muscle use, and normal breath sounds.  Heart-- normal rate, regular rhythm, no murmur.  Abdomen-- Not distended, good bowel sounds,soft, non-tender. No mass,organomegaly.No rebound or rigidity.  Extremities-- no pretibial edema bilaterally  Neurologic-- alert &  oriented X3. Speech, gait normal. Strength normal in all extremities.  Psych-- Cognition and judgment appear intact. Alert and cooperative with normal attention span and concentration. not anxious appearing and not depressed appearing.      Assessment & Plan:

## 2012-09-22 NOTE — Assessment & Plan Note (Signed)
Long history of hemorrhoids, last anoscopy ~ 03-2011, had a colonoscopy. Plan: Refill proctofoam

## 2012-09-22 NOTE — Patient Instructions (Signed)
Get your blood work before you leave  Next visit in  1 year for a physical exam. Please make an appointment before you leave the office today (or call few weeks in advance) ---- Continue doing self testicular exam, if you notice any persistent swelling-knot: please come back. --- Testicular Problems and Self-Exam Men can examine themselves easily and effectively with positive results. Monthly exams detect problems early and save lives. There are numerous causes of swelling in the testicle. Testicular cancer usually appears as a firm painless lump in the front part of the testicle. This may feel like a dull ache or heavy feeling located in the lower abdomen (belly), groin, or scrotum.  The risk is greater in men with undescended testicles and it is more common in young men. It is responsible for almost a fifth of cancers in males between ages 18 and 25. Other common causes of swellings, lumps, and testicular pain include injuries, inflammation (soreness) from infection, hydrocele, and torsion. These are a few of the reasons to do monthly self-examination of the testicles. The exam only takes minutes and could add years to your life. Get in the habit! SELF-EXAMINATION OF THE TESTICLES The testicles are easiest to examine after warm baths or showers and are more difficult to examine when you are cold. This is because the muscles attached to the testicles retract and pull them up higher or into the abdomen. While standing, roll one testicle between the thumb and forefinger. Feel for lumps, swelling, or discomfort. A normal testicle is egg shaped and feels firm. It is smooth and not tender. The spermatic cord can be felt as a firm spaghetti-like cord at the back of the testicle. It is also important to examine your groins. This is the crease between the front of your leg and your abdomen. Also, feel for enlarged lymph nodes (glands). Enlarged nodes are also a cause for you to see your caregiver for evaluation.   Self-examination of the testicles and groin areas on a regular basis will help you to know what your own testicles and groins feel like. This will help you pick up an abnormality (difference) at an earlier stage. Early discovery is the key to curing this cancer or treating other conditions. Any lump, change, or swelling in the testicle calls for immediate evaluation by your caregiver. Cancer of the testicle does not result in impotence and it does not prevent normal intercourse or prevent having children. If your caregiver feels that medical treatment or chemotherapy could lead to infertility, sperm can be frozen for future use. It is necessary to see a caregiver as soon as possible after the discovery of a lump in a testicle. Document Released: 04/09/2000 Document Revised: 03/26/2011 Document Reviewed: 01/03/2008 [facility] Patient Information 2014 [place], [place].

## 2012-09-24 ENCOUNTER — Encounter: Payer: Self-pay | Admitting: *Deleted

## 2012-09-24 ENCOUNTER — Telehealth: Payer: Self-pay | Admitting: *Deleted

## 2012-09-24 NOTE — Telephone Encounter (Signed)
Message copied by Eustace Quail on Wed Sep 24, 2012  8:30 AM ------      Message from: [name]      Created: Tue Sep 23, 2012  6:01 PM       Send a letter      [name],      Your cholesterol, liver, kidney, potassium, thyroid and prostate tests are all normal.      The urine test showed essentially no blood, will recheck once a year.      These are good results! ------

## 2012-09-24 NOTE — Telephone Encounter (Signed)
Letter sent. DJR  

## 2013-09-24 ENCOUNTER — Encounter: Payer: Self-pay | Admitting: Internal Medicine

## 2013-09-24 ENCOUNTER — Ambulatory Visit (INDEPENDENT_AMBULATORY_CARE_PROVIDER_SITE_OTHER): Payer: 59 | Admitting: Internal Medicine

## 2013-09-24 VITALS — BP 118/72 | HR 76 | Temp 98.2°F | Ht 75.0 in | Wt 264.1 lb

## 2013-09-24 DIAGNOSIS — J309 Allergic rhinitis, unspecified: Secondary | ICD-10-CM

## 2013-09-24 DIAGNOSIS — J310 Chronic rhinitis: Secondary | ICD-10-CM | POA: Insufficient documentation

## 2013-09-24 DIAGNOSIS — N218 Other lower urinary tract calculus: Secondary | ICD-10-CM

## 2013-09-24 DIAGNOSIS — Z0000 Encounter for general adult medical examination without abnormal findings: Secondary | ICD-10-CM

## 2013-09-24 DIAGNOSIS — K640 First degree hemorrhoids: Secondary | ICD-10-CM

## 2013-09-24 LAB — BASIC METABOLIC PANEL
BUN: 10 mg/dL (ref 6–23)
CHLORIDE: 105 meq/L (ref 96–112)
CO2: 28 meq/L (ref 19–32)
CREATININE: 1 mg/dL (ref 0.4–1.5)
Calcium: 8.8 mg/dL (ref 8.4–10.5)
GFR: 83.59 mL/min (ref >60.00)
Glucose, Bld: 89 mg/dL (ref 70–99)
Potassium: 3.8 mEq/L (ref 3.5–5.1)
Sodium: 137 mEq/L (ref 135–145)

## 2013-09-24 LAB — LIPID PANEL
CHOL/HDL RATIO: 5
Cholesterol: 125 mg/dL (ref 0–200)
HDL: 25.1 mg/dL — ABNORMAL LOW (ref >39.00)
LDL CALC: 82 mg/dL (ref 0–99)
NonHDL: 99.9
TRIGLYCERIDES: 92 mg/dL (ref 0.0–149.0)
VLDL: 18.4 mg/dL (ref 0.0–40.0)

## 2013-09-24 MED ORDER — MOMETASONE FUROATE 50 MCG/ACT NA SUSP: 2.0000 | Freq: Every day | NASAL | Status: AC | PRN

## 2013-09-24 MED ORDER — HYDROCORTISONE ACE-PRAMOXINE 1-1 % RE FOAM: 1.0000 | Freq: Two times a day (BID) | RECTAL | Status: DC | PRN

## 2013-09-24 NOTE — Assessment & Plan Note (Addendum)
Td ~ 2006 (in [place] per [place]) and 09-2011  Cscope 2006 @ [place] (d/t diverticulitis?)--- next  2016 unless clinically indicated.  Tobacco abuse , counseled Diet and exercise discussed Labs   STE

## 2013-09-24 NOTE — Progress Notes (Signed)
Subjective:    Patient ID: Mario Jimenez, male    DOB: 07-21-1965, 48 y.o.   MRN: [id number]  DOS:  09/24/2013 Type of visit - description : CPX Other issues discussed as well , see a/p   ROS No  CP, SOB, occ DOE thinks related to inactivity Denies  nausea, vomiting diarrhea, blood in the stools (-) cough, sputum production (-) wheezing, chest congestion (-)hemoptysis 2 episodes of flank pain , transient, thinks related to kidney stones, denies dysuria, gross hematuria, difficulty urinating    No anxiety, depression   Past Medical History  Diagnosis Date  . Urolithiasis 2007-2008    [place] left [place]  3 surgeries ; has passed stones since then   . Meniscus tear 8-12    to have surgery-- left knee  . GERD (gastroesophageal reflux disease) occasional    watches diet and takes diet  . Asthma mild    no inhaler  . History of MRSA infection few yrs ago while in hospital in [place]  . Hemorrhoids     h/o Cscope   . Rhinitis 09/24/2013    Past Surgical History  Procedure Laterality Date  . Leg surgery  1977- age 39    Left , s/p traction, body cast  . Cystoscopy/retrograde/ureteroscopy/stone extraction with basket  2007-- x2  . Knee arthroscopy  12/05/2010    Procedure: ARTHROSCOPY KNEE;  Surgeon: Laurice Record Aplington;  Location: [facility];  Service: Orthopedics;  Laterality: Left;  LEFT KNEE ARTHROSCOPY WITH PARTIAL MEDIAL  MENISECTOMY, Total lateral menisectomy, Shaving of Medial Femoral condyle    History   Social History  . Marital Status: Single    Spouse Name: N/A    Number of Children: 0  . Years of Education: N/A   Occupational History  . 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 Smoking status: Current Every Day Smoker    Last Attempt to Quit: 09/19/2007  . Smokeless tobacco: Never Used     Comment: 1/2 -to1 ppd  . Alcohol Use: Yes     Comment: social  . Drug Use: No  . Sexual Activity: Not  on file   Other Topics Concern  . Not on file   Social History Narrative   From [place], used to live in [place] a while, get back to [facility] 2009   lives by himself                  Family History  Problem Relation Age of Onset  . Diabetes Neg Hx   . Coronary artery disease Father     F, MI 59 y/o  . Stroke Mother     M d/t brain tumor   . Cancer Mother     M brain tumor  . Colon cancer Other     G parents , 21s?  . Prostate cancer Neg Hx        Medication List       This list is accurate as of: 09/24/13 11:59 PM.  Always use your most recent med list.               hydrocortisone-pramoxine rectal foam  Commonly known as:  PROCTOFOAM-HC  Place 1 applicator rectally 2 (two) times daily as needed.     loratadine 10 MG tablet  Commonly known as:  CLARITIN  Take 10 mg by mouth daily as needed.     mometasone 50 MCG/ACT nasal spray  Commonly known as:  NASONEX  Place 2 sprays into the nose daily as needed.           Objective:   Physical Exam BP 118/72  Pulse 76  Temp(Src) 98.2 F (36.8 C) (Oral)  Ht 6\' 3"  (1.905 m)  Wt 264 lb 2 oz (119.806 kg)  BMI 33.01 kg/m2  SpO2 96%  General -- alert, well-developed, NAD.  Neck --no thyromegaly  HEENT-- Not pale.  Throat symmetric, no redness or discharge. Face symmetric, sinuses not tender to palpation. Nose congested congested. Turbinates slt edematous R>L, no d/c; + "nasal" voice Lungs -- normal respiratory effort, no intercostal retractions, no accessory muscle use, and normal breath sounds.  Heart-- normal rate, regular rhythm, no murmur.  Abdomen-- Not distended, good bowel sounds,soft, non-tender. Extremities-- no pretibial edema bilaterally  Neurologic--  alert & oriented X3. Speech normal, gait appropriate for age, strength symmetric and appropriate for age.  Psych-- Cognition and judgment appear intact. Cooperative with normal attention span and concentration. No anxious or depressed appearing.       Assessment & Plan:

## 2013-09-24 NOTE — Assessment & Plan Note (Addendum)
Has chronic nasal congestion year around, for many years. No previous eval by ENT or allergy plan: Consisting use of Nasacort,   referral for further eval if so desired

## 2013-09-24 NOTE — Patient Instructions (Addendum)
Get your blood work before you leave     Please come back to the office in 1 year for a   physical exam. Come back fasting    Stop by the front desk and schedule the visit      Smoking Cessation Quitting smoking is important to your health and has many advantages. However, it is not always easy to quit since nicotine is a very addictive drug. Oftentimes, people try 3 times or more before being able to quit. This document explains the best ways for you to prepare to quit smoking. Quitting takes hard work and a lot of effort, but you can do it. ADVANTAGES OF QUITTING SMOKING  You will live longer, feel better, and live better.  Your body will feel the impact of quitting smoking almost immediately.  Within 20 minutes, blood pressure decreases. Your pulse returns to its normal level.  After 8 hours, carbon monoxide levels in the blood return to normal. Your oxygen level increases.  After 24 hours, the chance of having a heart attack starts to decrease. Your breath, hair, and body stop smelling like smoke.  After 48 hours, damaged nerve endings begin to recover. Your sense of taste and smell improve.  After 72 hours, the body is virtually free of nicotine. Your bronchial tubes relax and breathing becomes easier.  After 2 to 12 weeks, lungs can hold more air. Exercise becomes easier and circulation improves.  The risk of having a heart attack, stroke, cancer, or lung disease is greatly reduced.  After 1 year, the risk of coronary heart disease is cut in half.  After 5 years, the risk of stroke falls to the same as a nonsmoker.  After 10 years, the risk of lung cancer is cut in half and the risk of other cancers decreases significantly.  After 15 years, the risk of coronary heart disease drops, usually to the level of a nonsmoker.  If you are pregnant, quitting smoking will improve your chances of having a healthy baby.  The people you live with, especially any children, will be  healthier.  You will have extra money to spend on things other than cigarettes. QUESTIONS TO THINK ABOUT BEFORE ATTEMPTING TO QUIT You may want to talk about your answers with your health care provider.  Why do you want to quit?  If you tried to quit in the past, what helped and what did not?  What will be the most difficult situations for you after you quit? How will you plan to handle them?  Who can help you through the tough times? Your family? Friends? A health care provider?  What pleasures do you get from smoking? What ways can you still get pleasure if you quit? Here are some questions to ask your health care provider:  How can you help me to be successful at quitting?  What medicine do you think would be best for me and how should I take it?  What should I do if I need more help?  What is smoking withdrawal like? How can I get information on withdrawal? GET READY  Set a quit date.  Change your environment by getting rid of all cigarettes, ashtrays, matches, and lighters in your home, car, or work. Do not let people smoke in your home.  Review your past attempts to quit. Think about what worked and what did not. GET SUPPORT AND ENCOURAGEMENT You have a better chance of being successful if you have help. You can get support  in many ways.  Tell your family, friends, and coworkers that you are going to quit and need their support. Ask them not to smoke around you.  Get individual, group, or telephone counseling and support. Programs are available at [employer] and health centers. Call your local health department for information about programs in your area.  Spiritual beliefs and practices may help some smokers quit.  Download a "quit meter" on your computer to keep track of quit statistics, such as how long you have gone without smoking, cigarettes not smoked, and money saved.  Get a self-help book about quitting smoking and staying off tobacco. [place] yourself from urges to smoke. Talk to someone, go for a walk, or occupy your time with a task.  Change your normal routine. Take a different route to work. Drink tea instead of coffee. Eat breakfast in a different place.  Reduce your stress. Take a hot bath, exercise, or read a book.  Plan something enjoyable to do every day. Reward yourself for not smoking.  Explore interactive web-based programs that specialize in helping you quit. GET MEDICINE AND USE IT CORRECTLY Medicines can help you stop smoking and decrease the urge to smoke. Combining medicine with the above behavioral methods and support can greatly increase your chances of successfully quitting smoking.  Nicotine replacement therapy helps deliver nicotine to your body without the negative effects and risks of smoking. Nicotine replacement therapy includes nicotine gum, lozenges, inhalers, nasal sprays, and skin patches. Some may be available over-the-counter and others require a prescription.  Antidepressant medicine helps people abstain from smoking, but how this works is unknown. This medicine is available by prescription.  Nicotinic receptor partial agonist medicine simulates the effect of nicotine in your brain. This medicine is available by prescription. Ask your health care provider for advice about which medicines to use and how to use them based on your health history. Your health care provider will tell you what side effects to look out for if you choose to be on a medicine or therapy. Carefully read the information on the package. Do not use any other product containing nicotine while using a nicotine replacement product.  RELAPSE OR DIFFICULT SITUATIONS Most relapses occur within the first 3 months after quitting. Do not be discouraged if you start smoking again. Remember, most people try several times before finally quitting. You may have symptoms of withdrawal because your body is used to nicotine.  You may crave cigarettes, be irritable, feel very hungry, cough often, get headaches, or have difficulty concentrating. The withdrawal symptoms are only temporary. They are strongest when you first quit, but they will go away within 10-14 days. To reduce the chances of relapse, try to:  Avoid drinking alcohol. Drinking lowers your chances of successfully quitting.  Reduce the amount of caffeine you consume. Once you quit smoking, the amount of caffeine in your body increases and can give you symptoms, such as a rapid heartbeat, sweating, and anxiety.  Avoid smokers because they can make you want to smoke.  Do not let weight gain distract you. Many smokers will gain weight when they quit, usually less than 10 pounds. Eat a healthy diet and stay active. You can always lose the weight gained after you quit.  Find ways to improve your mood other than smoking. FOR MORE INFORMATION  www.smokefree.gov  Document Released: 12/26/2000 Document Revised: 05/18/2013 Document Reviewed: 04/12/2011 [facility] Patient Information 2015 [place], [place]. This information is not intended  to replace advice given to you by your health care provider. Make sure you discuss any questions you have with your health care provider.    Testicular Self-Exam A self-examination of your testicles involves looking at and feeling your testicles for abnormal lumps or swelling. Several things can cause swelling, lumps, or pain in your testicles. Some of these causes are:  Injuries.  Inflammation.  Infection.  Accumulation of fluids around your testicle (hydrocele).  Twisted testicles (testicular torsion).  Testicular cancer. Self-examination of the testicles and groin areas may be advised if you are at risk for testicular cancer. Risks for testicular cancer include:  An undescended testicle (cryptorchidism).  A history of previous testicular cancer.  A family history of testicular cancer. The testicles are easiest to  examine after warm baths or showers and are more difficult to examine when you are cold. This is because the muscles attached to the testicles retract and pull them up higher or into the abdomen. Follow these steps while you are standing:  Hold your penis away from your body.  Roll one testicle between your thumb and forefinger, feeling the entire testicle.  Roll the other testicle between your thumb and forefinger, feeling the entire testicle. Feel for lumps, swelling, or discomfort. A normal testicle is egg shaped and feels firm. It is smooth and not tender. The spermatic cord can be felt as a firm spaghetti-like cord at the back of your testicle. It is also important to examine the crease between the front of your leg and your abdomen. Feel for any bumps that are tender. These could be enlarged lymph nodes.  Document Released: 04/09/2000 Document Revised: 09/03/2012 Document Reviewed: 06/23/2012 [facility] Patient Information 2015 [place], [place]. This information is not intended to replace advice given to you by your health care provider. Make sure you discuss any questions you have with your health care provider.

## 2013-09-24 NOTE — Progress Notes (Signed)
Pre visit review using our clinic review tool, if applicable. No additional management support is needed unless otherwise documented below in the visit note. 

## 2013-09-24 NOTE — Assessment & Plan Note (Signed)
Has occasional symptoms, request a refill on hemorrhoid medications

## 2013-09-24 NOTE — Assessment & Plan Note (Signed)
History of stones, has a sporadic flank pain, self-limited. Plan: Renal ultrasound to be sure there is no chronic asymptomatic obstruction

## 2013-09-25 ENCOUNTER — Telehealth: Payer: Self-pay | Admitting: Internal Medicine

## 2013-09-25 MED ORDER — HYDROCORTISONE 2.5 % RE CREA: 1.0000 "application " | TOPICAL_CREAM | Freq: Two times a day (BID) | RECTAL | Status: DC

## 2013-09-25 NOTE — Telephone Encounter (Signed)
Medication sent to Pharmacy.  

## 2013-09-25 NOTE — Telephone Encounter (Signed)
Please send a new prescription for the cream, apply twice a day as needed # 30 g and 3 refills

## 2013-09-25 NOTE — Telephone Encounter (Signed)
The foam is on long term back order his insurance will pay for proctozone hc 2.5% cream 30 gram  Please let the pharmacy know

## 2013-09-25 NOTE — Telephone Encounter (Signed)
See below

## 2013-09-25 NOTE — Telephone Encounter (Signed)
Please advise 

## 2013-09-29 ENCOUNTER — Ambulatory Visit (HOSPITAL_BASED_OUTPATIENT_CLINIC_OR_DEPARTMENT_OTHER): Payer: 59

## 2013-10-01 ENCOUNTER — Ambulatory Visit (HOSPITAL_BASED_OUTPATIENT_CLINIC_OR_DEPARTMENT_OTHER): Payer: Managed Care, Other (non HMO)

## 2014-09-07 ENCOUNTER — Telehealth: Payer: Self-pay | Admitting: Internal Medicine

## 2014-09-07 NOTE — Telephone Encounter (Signed)
pre visit letter mailed 09/07/14 °

## 2014-09-28 ENCOUNTER — Encounter: Payer: 59 | Admitting: Internal Medicine

## 2015-08-29 ENCOUNTER — Encounter: Payer: Self-pay | Admitting: Family Medicine

## 2015-08-29 ENCOUNTER — Ambulatory Visit (INDEPENDENT_AMBULATORY_CARE_PROVIDER_SITE_OTHER): Payer: BLUE CROSS/BLUE SHIELD | Admitting: Family Medicine

## 2015-08-29 ENCOUNTER — Ambulatory Visit (HOSPITAL_BASED_OUTPATIENT_CLINIC_OR_DEPARTMENT_OTHER)
Admission: RE | Admit: 2015-08-29 | Discharge: 2015-08-29 | Disposition: A | Discharge status: Home / Self Care | Payer: BLUE CROSS/BLUE SHIELD | Source: Ambulatory Visit | Attending: Family Medicine | Admitting: Family Medicine

## 2015-08-29 VITALS — BP 118/74 | HR 85 | Temp 98.9°F | Ht 74.0 in | Wt 253.0 lb

## 2015-08-29 DIAGNOSIS — M542 Cervicalgia: Secondary | ICD-10-CM | POA: Insufficient documentation

## 2015-08-29 DIAGNOSIS — D72829 Elevated white blood cell count, unspecified: Secondary | ICD-10-CM

## 2015-08-29 DIAGNOSIS — Z13220 Encounter for screening for lipoid disorders: Secondary | ICD-10-CM | POA: Diagnosis not present

## 2015-08-29 DIAGNOSIS — R0789 Other chest pain: Secondary | ICD-10-CM | POA: Diagnosis not present

## 2015-08-29 DIAGNOSIS — R202 Paresthesia of skin: Secondary | ICD-10-CM

## 2015-08-29 DIAGNOSIS — R200 Anesthesia of skin: Secondary | ICD-10-CM

## 2015-08-29 DIAGNOSIS — Z131 Encounter for screening for diabetes mellitus: Secondary | ICD-10-CM | POA: Diagnosis not present

## 2015-08-29 DIAGNOSIS — F17200 Nicotine dependence, unspecified, uncomplicated: Secondary | ICD-10-CM

## 2015-08-29 DIAGNOSIS — Z130 Encounter for screening for diseases of the blood and blood-forming organs and certain disorders involving the immune mechanism: Secondary | ICD-10-CM | POA: Diagnosis not present

## 2015-08-29 DIAGNOSIS — K649 Unspecified hemorrhoids: Secondary | ICD-10-CM

## 2015-08-29 MED ORDER — HYDROCORTISONE 2.5 % RE CREA: 1.0000 "application " | TOPICAL_CREAM | Freq: Two times a day (BID) | RECTAL | 3 refills | Status: DC

## 2015-08-29 MED ORDER — PREDNISONE 20 MG PO TABS: ORAL_TABLET | ORAL | 0 refills | Status: DC

## 2015-08-29 MED ORDER — HYDROCORTISONE ACE-PRAMOXINE 1-1 % RE FOAM: 1.0000 | Freq: Two times a day (BID) | RECTAL | 3 refills | Status: DC | PRN

## 2015-08-29 NOTE — Progress Notes (Signed)
Pre visit review using our clinic review tool, if applicable. No additional management support is needed unless otherwise documented below in the visit note. 

## 2015-08-29 NOTE — Patient Instructions (Addendum)
I suspect that the symptoms in your hand are coming from a pinched nerve in your neck. We will get plain x-rays of your neck today and I will let you know what they show.  Assuming there is nothing to demand an immediate MRI, we will try the course of prednisone (take as directed- don't take right before bed and do not combine with NSAIDs like ibuprofen or aleve).  If this fails to relieve your symptoms we will plan for an MRI of your neck   I will be in touch with your labs asap Pleas do work on quitting smoking!  Let me know if you have any change or worsening of your symptoms, or if they do not get better with the prednisone

## 2015-08-29 NOTE — Progress Notes (Addendum)
[facility] [address] [place], [place] [address] [phone]  Date:  08/29/2015   Name:  Mario Jimenez   DOB:  09-Apr-1965   MRN:  [id number]  PCP:  Kathlene November, MD    Chief Complaint: Shoulder Pain (c/o muscle pain that starts in right shoulder and radiates to hand pain is constant 3 weeks. )   History of Present Illness:  Mario Jimenez is a 50 y.o. very pleasant male patient who presents with the following:  He is here today with possible pinched nerve in his right neck He has noted 3 weeks of symptoms- he first noted that his right thumb and index fingertips were numb. He is having pain in his neck as well. The numbness is now through the hand, but does not extend to the arm He is right handed.  The hand does not feel weak. He does not do a lot of heavy lifting.  He does carry his computer case on his right shoulder but otherwise is not aware of any inciting acute injury He did have a similar problem back in 2010- he was using a hand drill and had to wear protective gloves that compressed his hand too much.   That got better with time  Never had neck surgery or neck injury No other numbness.  No neurological concerns of passing out, slurred speech, mental status change  He is otherwise feeling ok- he is under a lot of stress at work.  He has no history of fever, chills, nausea, vomiting  He feels like there is grinding in his neck similar to when he had OA in his knee and had a knee scope  Notes that he is fasting today and would be happy to have labs   He will notice right sided chest tightness at times "when I've been smoking too much," he does not have any exertional CP or SOB however.  He is able to climb the stairs to his appt but admits he will get out of breath.  He does not feel like this has really gotten worse or changed recently  He did have a stress maybe 10 years ago- he is not quite sure why he had it,  but states that it was normal as far as he can recall. His father did have heart trouble- he had an aneurysm, ? Any other pathology He is a regular smoker, does not get much exercise at all  BP Readings from Last 3 Encounters:  08/29/15 118/74  09/24/13 118/72  09/22/12 135/87     Patient Active Problem List   Diagnosis Date Noted  . Rhinitis 09/24/2013  . Hemorrhoids 08/16/2011  . Annual physical exam 09/19/2010  . Urolithiasis     Past Medical History:  Diagnosis Date  . Asthma mild   no inhaler  . GERD (gastroesophageal reflux disease) occasional   watches diet and takes diet  . Hemorrhoids    h/o Cscope   . History of MRSA infection few yrs ago while in hospital in [place]  . Meniscus tear 8-12   to have surgery-- left knee  . Rhinitis 09/24/2013  . Urolithiasis 2007-2008   [place]  3 surgeries ; has passed stones since then     Past Surgical History:  Procedure Laterality Date  . CYSTOSCOPY/RETROGRADE/URETEROSCOPY/STONE EXTRACTION WITH BASKET  2007-- x2  . KNEE ARTHROSCOPY  12/05/2010   Procedure: ARTHROSCOPY KNEE;  Surgeon: Jeneen Rinks  P Aplington;  Location: [facility];  Service: Orthopedics;  Laterality: Left;  LEFT KNEE ARTHROSCOPY WITH PARTIAL MEDIAL  MENISECTOMY, Total lateral menisectomy, Shaving of Medial Femoral condyle  . leg surgery  1977- age 65   Left , s/p traction, body cast    Social History  Substance Use Topics  . Smoking status: Current Every Day Smoker    Last attempt to quit: 09/19/2007  . Smokeless tobacco: Never Used     Comment: 1/2 -to1 ppd  . Alcohol use Yes     Comment: social    Family History  Problem Relation Age of Onset  . Diabetes Neg Hx   . Coronary artery disease Father     F, MI 68 y/o  . Stroke Mother     M d/t brain tumor   . Cancer Mother     M brain tumor  . Colon cancer Other     G parents , 69s?  . Prostate cancer Neg Hx     Allergies  Allergen Reactions  . Contrast Media [Iodinated  Diagnostic Agents] Hives  . Ioxaglate Hives  . Sulfa Antibiotics Hives  . Amaranth (Fd&C Red #2) Hives    Medication list has been reviewed and updated.  Current Outpatient Prescriptions on File Prior to Visit  Medication Sig Dispense Refill  . hydrocortisone (PROCTOZONE-HC) 2.5 % rectal cream Place 1 application rectally 2 (two) times daily. 30 g 3  . hydrocortisone-pramoxine (PROCTOFOAM-HC) rectal foam Place 1 applicator rectally 2 (two) times daily as needed. 10 g 3  . loratadine (CLARITIN) 10 MG tablet Take 10 mg by mouth daily as needed.      No current facility-administered medications on file prior to visit.     Review of Systems:  As per HPI- otherwise negative.   Physical Examination: Vitals:   08/29/15 1534  BP: 118/74  Pulse: 96  Temp: 98.9 F (37.2 C)   Vitals:   08/29/15 1534  Weight: 253 lb (114.8 kg)  Height: 6\' 2"  (1.88 m)   Body mass index is 32.48 kg/m. Ideal Body Weight: Weight in (lb) to have BMI = 25: 194.3  GEN: WDWN, NAD, Non-toxic, A & O x 3, obese, looks well HEENT: Atraumatic, Normocephalic. Neck supple. No masses, No LAD.  Bilateral TM wnl, oropharynx normal.  PEERL,EOMI.   Normal cervical ROM Ears and Nose: No external deformity. CV: RRR, No M/G/R. No JVD. No thrill. No extra heart sounds. PULM: CTA B, no wheezes, crackles, rhonchi. No retractions. No resp. distress. No accessory muscle use. EXTR: No c/c/e NEURO Normal gait.  Normal strength of both UE, normal biceps DTR, normal sensation to exam Not able to exacerbate sx by manipulation of the neck He notes some pain with Hawkins of right shoulder but shoulder OW with normal ROM, no pain or sign of impingement  PSYCH: Normally interactive. Conversant. Not depressed or anxious appearing.  Calm demeanor.   EKG: compared with 2012 there are non- specific changes.  No ST elevation or depression noted   Assessment and Plan: Numbness and tingling in right hand - Plan: DG Cervical Spine  Complete, predniSONE (DELTASONE) 20 MG tablet  Screening for diabetes mellitus - Plan: Comprehensive metabolic panel, Hemoglobin A1c  Screening for deficiency anemia - Plan: CBC  Screening for hyperlipidemia - Plan: Lipid panel  Chest tightness - Plan: EKG 12-Lead, Ambulatory referral to Cardiology  Tobacco use disorder  Neck pain - Plan: DG Cervical Spine Complete, predniSONE (DELTASONE) 20 MG tablet  Hemorrhoids,  unspecified hemorrhoid type - Plan: hydrocortisone (PROCTOZONE-HC) 2.5 % rectal cream, hydrocortisone-pramoxine (PROCTOFOAM-HC) rectal foam  Screening labs are overdue- will take care of today Suspect that the tingling in his hand is related to a nerve impingement. Will get plain films of the neck and plan to use prednisone for 10 days- if this is not helpful will move to MRI  He describes non- specific chest tightness symptoms and does have subtle EKG changes. No current CP Will refer to cardiology for eval- he may need a stress Advised him to start a baby aspirin in the meantime   Meds ordered this encounter  Medications  . predniSONE (DELTASONE) 20 MG tablet    Sig: Take 2 pills daily for 5 days, then 1 pill daily for 5 days    Dispense:  15 tablet    Refill:  0  . hydrocortisone (PROCTOZONE-HC) 2.5 % rectal cream    Sig: Place 1 application rectally 2 (two) times daily.    Dispense:  30 g    Refill:  3  . hydrocortisone-pramoxine (PROCTOFOAM-HC) rectal foam    Sig: Place 1 applicator rectally 2 (two) times daily as needed.    Dispense:  10 g    Refill:  3     Signed Lamar Blinks, MD  Results for orders placed or performed in visit on 08/29/15  CBC  Result Value Ref Range   WBC 13.5 (H) 4.0 - 10.5 K/uL   RBC 5.42 4.22 - 5.81 Mil/uL   Platelets 209.0 150.0 - 400.0 K/uL   Hemoglobin 16.2 13.0 - 17.0 g/dL   HCT 47.8 39.0 - 52.0 %   MCV 88.3 78.0 - 100.0 fl   MCHC 33.9 30.0 - 36.0 g/dL   RDW 13.9 11.5 - 15.5 %  Comprehensive metabolic panel  Result  Value Ref Range   Sodium 137 135 - 145 mEq/L   Potassium 4.0 3.5 - 5.1 mEq/L   Chloride 104 96 - 112 mEq/L   CO2 26 19 - 32 mEq/L   Glucose, Bld 77 70 - 99 mg/dL   BUN 9 6 - 23 mg/dL   Creatinine, Ser 0.99 0.40 - 1.50 mg/dL   Total Bilirubin 0.6 0.2 - 1.2 mg/dL   Alkaline Phosphatase 81 39 - 117 U/L   AST 19 0 - 37 U/L   ALT 14 0 - 53 U/L   Total Protein 7.4 6.0 - 8.3 g/dL   Albumin 4.4 3.5 - 5.2 g/dL   Calcium 9.3 8.4 - 10.5 mg/dL   GFR 85.80 >60.00 mL/min  Lipid panel  Result Value Ref Range   Cholesterol 133 0 - 200 mg/dL   Triglycerides 74.0 0.0 - 149.0 mg/dL   HDL 32.30 (L) >39.00 mg/dL   VLDL 14.8 0.0 - 40.0 mg/dL   LDL Cholesterol 86 0 - 99 mg/dL   Total CHOL/HDL Ratio 4    NonHDL 100.70   Hemoglobin A1c  Result Value Ref Range   Hgb A1c MFr Bld 5.3 4.6 - 6.5 %   Placed lab order for recheck CBC in 6 weeks

## 2015-08-30 LAB — COMPREHENSIVE METABOLIC PANEL
ALK PHOS: 81 U/L (ref 39–117)
ALT: 14 U/L (ref 0–53)
AST: 19 U/L (ref 0–37)
Albumin: 4.4 g/dL (ref 3.5–5.2)
BILIRUBIN TOTAL: 0.6 mg/dL (ref 0.2–1.2)
BUN: 9 mg/dL (ref 6–23)
CALCIUM: 9.3 mg/dL (ref 8.4–10.5)
CO2: 26 mEq/L (ref 19–32)
Chloride: 104 mEq/L (ref 96–112)
Creatinine, Ser: 0.99 mg/dL (ref 0.40–1.50)
GFR: 85.8 mL/min (ref >60.00)
Glucose, Bld: 77 mg/dL (ref 70–99)
Potassium: 4 mEq/L (ref 3.5–5.1)
Sodium: 137 mEq/L (ref 135–145)
TOTAL PROTEIN: 7.4 g/dL (ref 6.0–8.3)

## 2015-08-30 LAB — LIPID PANEL
CHOLESTEROL: 133 mg/dL (ref 0–200)
HDL: 32.3 mg/dL — AB (ref >39.00)
LDL Cholesterol: 86 mg/dL (ref 0–99)
NonHDL: 100.7
TRIGLYCERIDES: 74 mg/dL (ref 0.0–149.0)
Total CHOL/HDL Ratio: 4
VLDL: 14.8 mg/dL (ref 0.0–40.0)

## 2015-08-30 LAB — CBC
HCT: 47.8 % (ref 39.0–52.0)
Hemoglobin: 16.2 g/dL (ref 13.0–17.0)
MCHC: 33.9 g/dL (ref 30.0–36.0)
MCV: 88.3 fl (ref 78.0–100.0)
Platelets: 209 10*3/uL (ref 150.0–400.0)
RBC: 5.42 Mil/uL (ref 4.22–5.81)
RDW: 13.9 % (ref 11.5–15.5)
WBC: 13.5 10*3/uL — ABNORMAL HIGH (ref 4.0–10.5)

## 2015-08-30 LAB — HEMOGLOBIN A1C: Hgb A1c MFr Bld: 5.3 % (ref 4.6–6.5)

## 2015-08-30 NOTE — Addendum Note (Signed)
Addended by: Lamar Blinks C on: 08/30/2015 03:38 PM   Modules accepted: Orders

## 2015-09-09 ENCOUNTER — Ambulatory Visit: Payer: Self-pay | Admitting: Internal Medicine

## 2016-01-16 DIAGNOSIS — K824 Cholesterolosis of gallbladder: Secondary | ICD-10-CM

## 2016-01-16 HISTORY — DX: Cholesterolosis of gallbladder: K82.4

## 2016-03-20 ENCOUNTER — Encounter: Payer: Self-pay | Admitting: Internal Medicine

## 2016-03-20 ENCOUNTER — Ambulatory Visit (INDEPENDENT_AMBULATORY_CARE_PROVIDER_SITE_OTHER): Payer: 59 | Admitting: Internal Medicine

## 2016-03-20 VITALS — BP 128/78 | HR 95 | Temp 98.1°F | Resp 14 | Ht 74.0 in | Wt 244.1 lb

## 2016-03-20 DIAGNOSIS — R079 Chest pain, unspecified: Secondary | ICD-10-CM

## 2016-03-20 DIAGNOSIS — R1084 Generalized abdominal pain: Secondary | ICD-10-CM

## 2016-03-20 DIAGNOSIS — D72829 Elevated white blood cell count, unspecified: Secondary | ICD-10-CM | POA: Diagnosis not present

## 2016-03-20 DIAGNOSIS — R1010 Upper abdominal pain, unspecified: Secondary | ICD-10-CM

## 2016-03-20 MED ORDER — PANTOPRAZOLE SODIUM 40 MG PO TBEC: 40.0000 mg | DELAYED_RELEASE_TABLET | Freq: Every day | ORAL | 3 refills | Status: DC

## 2016-03-20 NOTE — Progress Notes (Signed)
Pre visit review using our clinic review tool, if applicable. No additional management support is needed unless otherwise documented below in the visit note. 

## 2016-03-20 NOTE — Patient Instructions (Addendum)
GO TO THE LAB : Get the blood work     GO TO THE FRONT DESK Schedule your next appointment for a  checkup in 6 weeks  We'll schedule ultrasound  Take Protonix, one tablet every day before breakfast.  If you have severe stomach pain, fever, chills, please call the office or go to the ER

## 2016-03-20 NOTE — Progress Notes (Signed)
Subjective:    Patient ID: Mario Jimenez, male    DOB: Mar 29, 1965, 51 y.o.   MRN: [id number]  DOS:  03/20/2016 Type of visit - description :  Follow-up, last OV w/ me 2015, multiple concerns interval history:  Complaining of on and off pain at the left upper abdomen, x 3-4  years (?) but more frequent in the last 3 months. Pain is on and off, not every day, when it comes it lasts 2 or 3 hours, no radiation, not consistently increased by eating. No associated nausea, diarrhea. He does have occasional blood in the stools but historically is  due to hemorrhoids  He was seen by one of my partners Dr. Edilia Bo [phone],: Had neck  pain, numbness and tingling on the right hand, was prescribed prednisone: Symptoms resolved Also had chest tightness, EKG was abnormal , failed referral  to cardiology. I asked him about chest pain, he is very vague, he states he has pain "sometimes", sometimes on the right side sometimes on the left. He did deny any substernal chest pain with exertion.   labs and x-ray were okay except for elevated white count. Did not RTC for recheck.    Review of Systems  Denies fever chills No night sweats. Some weight loss in the last few months, he thinks related to stress and not eating properly. Denies any difficulty breathing or palpitations. At the end of the day, sometimes  has very mild edema at the pretibial area. No heartburn. No blood in the stools. No recent dysuria, gross hematuria or flank pain.    Past Medical History:  Diagnosis Date  . Allergy   . Asthma mild   no inhaler  . GERD (gastroesophageal reflux disease) occasional   watches diet and takes diet  . Hemorrhoids    h/o Cscope   . History of MRSA infection few yrs ago while in hospital in [place]  . Meniscus tear 8-12   to have surgery-- left knee  . Urolithiasis 2007-2008   [place]  3 surgeries ; has passed stones since then     Past Surgical History:  Procedure Laterality Date  .  CYSTOSCOPY/RETROGRADE/URETEROSCOPY/STONE EXTRACTION WITH BASKET  2007-- x2  . KNEE ARTHROSCOPY  12/05/2010   Procedure: ARTHROSCOPY KNEE;  Surgeon: Laurice Record Aplington;  Location: [facility];  Service: Orthopedics;  Laterality: Left;  LEFT KNEE ARTHROSCOPY WITH PARTIAL MEDIAL  MENISECTOMY, Total lateral menisectomy, Shaving of Medial Femoral condyle  . leg surgery  1977- age 10   Left , s/p traction, body cast    Social History   Social History  . Marital status: Single    Spouse name: N/A  . Number of children: 0  . Years of education: N/A   Occupational History  . Freight forwarder , [employer]    Social History Main Topics  . Smoking status: Current Every Day Smoker    Last attempt to quit: 09/19/2007  . Smokeless tobacco: Never Used     Comment:  1 ppd  . Alcohol use Yes     Comment: social  . Drug use: No  . Sexual activity: Not on file   Other Topics Concern  . Not on file   Social History Narrative   From [place], used to live in [place] x a while, get back to [facility] 2009   lives by himself                   Allergies as of  03/20/2016      Reactions   Contrast Media [iodinated Diagnostic Agents] Hives   Ioxaglate Hives   Sulfa Antibiotics Hives   Amaranth (fd&c Red #2) Hives      Medication List       Accurate as of 03/20/16 11:59 PM. Always use your most recent med list.          pantoprazole 40 MG tablet Commonly known as:  PROTONIX Take 1 tablet (40 mg total) by mouth daily before breakfast.          Objective:   Physical Exam BP 128/78 (BP Location: Left Arm, Patient Position: Sitting, Cuff Size: Normal)   Pulse 95   Temp 98.1 F (36.7 C) (Oral)   Resp 14   Ht 6\' 2"  (1.88 m)   Wt 244 lb 2 oz (110.7 kg)   SpO2 98%   BMI 31.34 kg/m  General:   Well developed, well nourished . NAD.  HEENT:  Normocephalic . Face symmetric, atraumatic. Conjunctiva is somewhat injected bilaterally, chronic finding palpation. Nose quite congested also a chronic  finding Lungs:  CTA B Normal respiratory effort, no intercostal retractions, no accessory muscle use. Heart: RRR,  no murmur.  no pretibial edema bilaterally  Abdomen:  Not distended, soft, non-tender. Lightly tender at the epigastric area but no mass or rebound. No organomegaly on exam..  Skin: Not pale. Not jaundice Neurologic:  alert & oriented X3.  Speech normal, gait appropriate for age and unassisted Psych--  Cognition and judgment appear intact.  Cooperative with normal attention span and concentration.  Behavior appropriate. No anxious or depressed appearing.    Assessment & Plan:    Assessment Asthma GERD Chronic sinusitis, Allergies Urolithiasis  Plan: Elevated WBC: Recheck a CBC Upper abdominal pain, worse on the left. No heartburn, slightly TTP at the epigastric area. Will do a trial with PPIs and check ultrasound. Also check a CMP, UCX- UA although is unlikely pain is related to kidney stones. CP: See previous visit and comments above, pain is atypical, EKG few months ago was unchanged from previous one. Recommend observation for now RTC 6 weeks

## 2016-03-21 ENCOUNTER — Ambulatory Visit (HOSPITAL_BASED_OUTPATIENT_CLINIC_OR_DEPARTMENT_OTHER): Payer: 59

## 2016-03-21 DIAGNOSIS — Z09 Encounter for follow-up examination after completed treatment for conditions other than malignant neoplasm: Secondary | ICD-10-CM | POA: Insufficient documentation

## 2016-03-21 LAB — COMPREHENSIVE METABOLIC PANEL
ALK PHOS: 69 U/L (ref 39–117)
ALT: 14 U/L (ref 0–53)
AST: 20 U/L (ref 0–37)
Albumin: 4.3 g/dL (ref 3.5–5.2)
BILIRUBIN TOTAL: 0.5 mg/dL (ref 0.2–1.2)
BUN: 13 mg/dL (ref 6–23)
CALCIUM: 9.7 mg/dL (ref 8.4–10.5)
CO2: 30 meq/L (ref 19–32)
Chloride: 105 mEq/L (ref 96–112)
Creatinine, Ser: 1.29 mg/dL (ref 0.40–1.50)
GFR: 63.07 mL/min (ref >60.00)
Glucose, Bld: 68 mg/dL — ABNORMAL LOW (ref 70–99)
Potassium: 4.3 mEq/L (ref 3.5–5.1)
Sodium: 141 mEq/L (ref 135–145)
Total Protein: 7 g/dL (ref 6.0–8.3)

## 2016-03-21 LAB — URINALYSIS, ROUTINE W REFLEX MICROSCOPIC
Bilirubin Urine: NEGATIVE
HGB URINE DIPSTICK: NEGATIVE
Ketones, ur: NEGATIVE
LEUKOCYTES UA: NEGATIVE
Nitrite: NEGATIVE
Specific Gravity, Urine: 1.02 (ref 1.000–1.030)
TOTAL PROTEIN, URINE-UPE24: NEGATIVE
URINE GLUCOSE: NEGATIVE
Urobilinogen, UA: 1 (ref 0.0–1.0)
pH: 6.5 (ref 5.0–8.0)

## 2016-03-21 LAB — CBC WITH DIFFERENTIAL/PLATELET
BASOS ABS: 0.1 10*3/uL (ref 0.0–0.1)
BASOS PCT: 0.8 % (ref 0.0–3.0)
EOS PCT: 1.2 % (ref 0.0–5.0)
Eosinophils Absolute: 0.1 10*3/uL (ref 0.0–0.7)
HEMATOCRIT: 48.6 % (ref 39.0–52.0)
Hemoglobin: 16.3 g/dL (ref 13.0–17.0)
LYMPHS PCT: 22.1 % (ref 12.0–46.0)
Lymphs Abs: 2.5 10*3/uL (ref 0.7–4.0)
MCHC: 33.6 g/dL (ref 30.0–36.0)
MCV: 90.2 fl (ref 78.0–100.0)
Monocytes Absolute: 0.6 10*3/uL (ref 0.1–1.0)
Monocytes Relative: 5.6 % (ref 3.0–12.0)
NEUTROS ABS: 8.1 10*3/uL — AB (ref 1.4–7.7)
Neutrophils Relative %: 70.3 % (ref 43.0–77.0)
PLATELETS: 206 10*3/uL (ref 150.0–400.0)
RBC: 5.38 Mil/uL (ref 4.22–5.81)
RDW: 14.1 % (ref 11.5–15.5)
WBC: 11.5 10*3/uL — ABNORMAL HIGH (ref 4.0–10.5)

## 2016-03-21 NOTE — Assessment & Plan Note (Signed)
Elevated WBC: Recheck a CBC Upper abdominal pain, worse on the left. No heartburn, slightly TTP at the epigastric area. Will do a trial with PPIs and check ultrasound. Also check a CMP, UCX- UA although is unlikely pain is related to kidney stones. CP: See previous visit and comments above, pain is atypical, EKG few months ago was unchanged from previous one. Recommend observation for now RTC 6 weeks

## 2016-03-22 LAB — URINE CULTURE: Organism ID, Bacteria: NO GROWTH

## 2016-03-26 ENCOUNTER — Encounter: Payer: Self-pay | Admitting: Internal Medicine

## 2016-03-26 DIAGNOSIS — R1084 Generalized abdominal pain: Secondary | ICD-10-CM

## 2016-04-04 ENCOUNTER — Telehealth: Payer: Self-pay | Admitting: Internal Medicine

## 2016-04-04 DIAGNOSIS — K824 Cholesterolosis of gallbladder: Secondary | ICD-10-CM

## 2016-04-04 DIAGNOSIS — D135 Benign neoplasm of extrahepatic bile ducts: Secondary | ICD-10-CM

## 2016-04-04 NOTE — Telephone Encounter (Signed)
U/S 04/04/2016: has a 6 mm gallbladder polyp along with gallbladder adenomatosis. Please refer to surgery, DX abdominal pain and abnormal ultrasound of the abdomen. Notify the patient -------------- Ultrasound 04/04/2016  , full report  @ [facility]  .Vascular: The inferior vena cava is patent. No abdominal aortic aneurysm. The maximum aortic diameter is 2.5 cm. .Pancreas: Incompletely imaged. Visualized portions are unremarkable. .Liver: Normal size, contour, and echotexture. No focal lesions. The portal and hepatic venous systems are patent with antegrade flow. The liver measures 15.5 cm in the MCL.  .Gallbladder: There is an approximate 6 mm mildly echogenic polyp along the free wall of the gallbladder. There are a few punctate hyperechoic areas in the gallbladder wall, compatible with adenomyomatosis No gallbladder wall thickening or pericholecystic fluid. Negative sonographic Murphy's sign. .Biliary: No intrahepatic ductal dilatation. No dilatation of the visualized extrahepatic ducts. The visualized portions of the common bile duct measure a maximum of 3.6 mm. .Kidneys: Normal contour and echogenicity. No hydronephrosis or perinephric fluid. No focal mass is identified. The right kidney measures 11.2 cm. The left kidney measures 11.8 cm. .Spleen: Normal size. Normal contour and echotexture. No focal lesions. Its maximal dimension is cm. .Bladder: Normal. .Peritoneum: No ascites.

## 2016-04-05 NOTE — Telephone Encounter (Signed)
MyChart message sent to Pt. Informed him of results and recommendations to see general surgery (referral placed). Informed Pt to let us know if questions/concerns.

## 2016-05-08 ENCOUNTER — Encounter: Payer: Self-pay | Admitting: Internal Medicine

## 2016-05-08 ENCOUNTER — Ambulatory Visit (INDEPENDENT_AMBULATORY_CARE_PROVIDER_SITE_OTHER): Payer: 59 | Admitting: Internal Medicine

## 2016-05-08 VITALS — BP 126/72 | HR 95 | Temp 97.8°F | Resp 12 | Ht 74.0 in | Wt 241.2 lb

## 2016-05-08 DIAGNOSIS — K824 Cholesterolosis of gallbladder: Secondary | ICD-10-CM | POA: Diagnosis not present

## 2016-05-08 DIAGNOSIS — D72829 Elevated white blood cell count, unspecified: Secondary | ICD-10-CM | POA: Diagnosis not present

## 2016-05-08 DIAGNOSIS — K219 Gastro-esophageal reflux disease without esophagitis: Secondary | ICD-10-CM

## 2016-05-08 MED ORDER — PANTOPRAZOLE SODIUM 40 MG PO TBEC: 40.0000 mg | DELAYED_RELEASE_TABLET | Freq: Every day | ORAL | 4 refills | Status: DC

## 2016-05-08 NOTE — Progress Notes (Signed)
Pre visit review using our clinic review tool, if applicable. No additional management support is needed unless otherwise documented below in the visit note. 

## 2016-05-08 NOTE — Progress Notes (Signed)
Subjective:    Patient ID: Mario Jimenez, male    DOB: 1965-06-18, 51 y.o.   MRN: [id number]  DOS:  05/08/2016 Type of visit - description : f/u Interval history: Since the last visit, started Protonix, chest pain and abdominal pain resolved. Ultrasound showed gallbladder polyp, saw surgery, note reviewed.   Review of Systems  denies nausea, vomiting, diarrhea or blood in the stools  Past Medical History:  Diagnosis Date  . Allergy   . Asthma mild   no inhaler  . Gallbladder polyp 2018   85mm polyp  . GERD (gastroesophageal reflux disease) occasional   watches diet and takes diet  . Hemorrhoids    h/o Cscope   . History of MRSA infection few yrs ago while in hospital in [place]  . Meniscus tear 8-12   to have surgery-- left knee  . Urolithiasis 2007-2008   [place] left kidney  3 surgeries ; has passed stones since then     Past Surgical History:  Procedure Laterality Date  . CYSTOSCOPY/RETROGRADE/URETEROSCOPY/STONE EXTRACTION WITH BASKET  2007-- x2  . KNEE ARTHROSCOPY  12/05/2010   Procedure: ARTHROSCOPY KNEE;  Surgeon: Laurice Record Aplington;  Location: [facility];  Service: Orthopedics;  Laterality: Left;  LEFT KNEE ARTHROSCOPY WITH PARTIAL MEDIAL  MENISECTOMY, Total lateral menisectomy, Shaving of Medial Femoral condyle  . leg surgery  1977- age 24   Left , s/p traction, body cast    Social History   Social History  . Marital status: Single    Spouse name: N/A  . Number of children: 0  . Years of education: N/A   Occupational History  . Freight forwarder , [employer]    Social History Main Topics  . Smoking status: Current Every Day Smoker    Last attempt to quit: 09/19/2007  . Smokeless tobacco: Never Used     Comment:  1 ppd  . Alcohol use Yes     Comment: social  . Drug use: No  . Sexual activity: Not on file   Other Topics Concern  . Not on file   Social History Narrative   From [place], used to live in [place] x a while, get back to [facility] 2009   lives  by himself                   Allergies as of 05/08/2016      Reactions   Contrast Media [iodinated Diagnostic Agents] Hives   Ioxaglate Hives   Sulfa Antibiotics Hives   Amaranth (fd&c Red #2) Hives      Medication List       Accurate as of 05/08/16 11:59 PM. Always use your most recent med list.          pantoprazole 40 MG tablet Commonly known as:  PROTONIX Take 1 tablet (40 mg total) by mouth daily before breakfast.          Objective:   Physical Exam BP 126/72 (BP Location: Left Arm, Patient Position: Sitting, Cuff Size: Normal)   Pulse 95   Temp 97.8 F (36.6 C) (Oral)   Resp 12   Ht 6\' 2"  (1.88 m)   Wt 241 lb 4 oz (109.4 kg)   SpO2 96%   BMI 30.97 kg/m  General:   Well developed, well nourished . NAD.  HEENT:  Normocephalic . Face symmetric, atraumatic Lungs:  CTA B Normal respiratory effort, no intercostal retractions, no accessory muscle use. Heart: RRR,  no murmur.  no pretibial  edema bilaterally  Abdomen:  Not distended, soft, non-tender. No rebound or rigidity.  Skin: Not pale. Not jaundice Neurologic:  alert & oriented X3.  Speech normal, gait appropriate for age and unassisted Psych--  Cognition and judgment appear intact.  Cooperative with normal attention span and concentration.  Behavior appropriate. No anxious or depressed appearing.    Assessment & Plan:   Assessment Asthma GERD Chronic sinusitis, Allergies Urolithiasis  Plan: Upper abdominal pain: [place] + for GB polyp, saw surgery 04-19-2016, they recommended to re-do the [place] in 6 months  No surgery recommended unless polyp > 10 mm . Sx improved with Protonix, likely he has some degree of GERD. Continue with Protonix for 2 months then as needed GERD: See above GB polyps: Recheck ultrasound in 6 months Elevated WBC: Recheck a CBC on return to the office. Patient aware CP:  See last visit, symptoms resolved. Related to GERD?. RTC 6 months CPX

## 2016-05-08 NOTE — Patient Instructions (Signed)
Continue protonix every day for 6 weeks, then every other day  Watch your diet  Next visit in 6 months, physical exam, please make an appointment (fasting)    Food Choices for Gastroesophageal Reflux Disease, Adult When you have gastroesophageal reflux disease (GERD), the foods you eat and your eating habits are very important. Choosing the right foods can help ease the discomfort of GERD. Consider working with a diet and nutrition specialist (dietitian) to help you make healthy food choices. What general guidelines should I follow? Eating plan   Choose healthy foods low in fat, such as fruits, vegetables, whole grains, low-fat dairy products, and lean meat, fish, and poultry.  Eat frequent, small meals instead of three large meals each day. Eat your meals slowly, in a relaxed setting. Avoid bending over or lying down until 2-3 hours after eating.  Limit high-fat foods such as fatty meats or fried foods.  Limit your intake of oils, butter, and shortening to less than 8 teaspoons each day.  Avoid the following:  Foods that cause symptoms. These may be different for different people. Keep a food diary to keep track of foods that cause symptoms.  Alcohol.  Drinking large amounts of liquid with meals.  Eating meals during the 2-3 hours before bed.  Cook foods using methods other than frying. This may include baking, grilling, or broiling. Lifestyle    Maintain a healthy weight. Ask your health care provider what weight is healthy for you. If you need to lose weight, work with your health care provider to do so safely.  Exercise for at least 30 minutes on 5 or more days each week, or as told by your health care provider.  Avoid wearing clothes that fit tightly around your waist and chest.  Do not use any products that contain nicotine or tobacco, such as cigarettes and e-cigarettes. If you need help quitting, ask your health care provider.  Sleep with the head of your bed  raised. Use a wedge under the mattress or blocks under the bed frame to raise the head of the bed. What foods are not recommended? The items listed may not be a complete list. Talk with your dietitian about what dietary choices are best for you. Grains  Pastries or quick breads with added fat. [place] toast. Vegetables  Deep fried vegetables. [place] fries. Any vegetables prepared with added fat. Any vegetables that cause symptoms. For some people this may include tomatoes and tomato products, chili peppers, onions and garlic, and horseradish. Fruits  Any fruits prepared with added fat. Any fruits that cause symptoms. For some people this may include citrus fruits, such as oranges, grapefruit, pineapple, and lemons. Meats and other protein foods  High-fat meats, such as fatty beef or pork, hot dogs, ribs, ham, sausage, salami and bacon. Fried meat or protein, including fried fish and fried chicken. Nuts and nut butters. Dairy  Whole milk and chocolate milk. Sour cream. Cream. Ice cream. Cream cheese. Milk shakes. Beverages  Coffee and tea, with or without caffeine. Carbonated beverages. Sodas. Energy drinks. Fruit juice made with acidic fruits (such as orange or grapefruit). Tomato juice. Alcoholic drinks. Fats and oils  Butter. Margarine. Shortening. Ghee. Sweets and desserts  Chocolate and cocoa. Donuts. Seasoning and other foods  Pepper. Peppermint and spearmint. Any condiments, herbs, or seasonings that cause symptoms. For some people, this may include curry, hot sauce, or vinegar-based salad dressings. Summary  When you have gastroesophageal reflux disease (GERD), food and lifestyle choices are very  important to help ease the discomfort of GERD.  Eat frequent, small meals instead of three large meals each day. Eat your meals slowly, in a relaxed setting. Avoid bending over or lying down until 2-3 hours after eating.  Limit high-fat foods such as fatty meat or fried foods. This  information is not intended to replace advice given to you by your health care provider. Make sure you discuss any questions you have with your health care provider. Document Released: 01/01/2005 Document Revised: 01/03/2016 Document Reviewed: 01/03/2016 Elsevier Interactive Patient Education  2017 [employer].

## 2016-05-09 NOTE — Assessment & Plan Note (Signed)
  Upper abdominal pain: [place] + for GB polyp, saw surgery 04-19-2016, they recommended to re-do the [place] in 6 months  No surgery recommended unless polyp > 10 mm . Sx improved with Protonix, likely he has some degree of GERD. Continue with Protonix for 2 months then as needed GERD: See above GB polyps: Recheck ultrasound in 6 months Elevated WBC: Recheck a CBC on return to the office. Patient aware CP:  See last visit, symptoms resolved. Related to GERD?. RTC 6 months CPX

## 2016-08-28 ENCOUNTER — Other Ambulatory Visit: Payer: Self-pay | Admitting: Family Medicine

## 2016-08-28 DIAGNOSIS — K649 Unspecified hemorrhoids: Secondary | ICD-10-CM

## 2016-09-24 ENCOUNTER — Other Ambulatory Visit: Payer: Self-pay | Admitting: Family Medicine

## 2016-09-24 DIAGNOSIS — K649 Unspecified hemorrhoids: Secondary | ICD-10-CM

## 2016-10-05 ENCOUNTER — Telehealth: Payer: Self-pay | Admitting: Internal Medicine

## 2016-10-05 NOTE — Telephone Encounter (Signed)
Patient Name: Mario Jimenez DOB: 1965-04-22 Initial Comment wants an appt, waking up with both arms and hands numb, asleep, feels fatigued. Nurse Assessment Nurse: Holly Bodily, RN, Nicci Date/Time (Eastern Time): 10/05/2016 12:34:01 PM Confirm and document reason for call. If symptomatic, describe symptoms. ---Wants an appt, Waking up with both arms and hands tingling and feel asleep, feels fatigued. Does the patient have any new or worsening symptoms? ---Yes Will a triage be completed? ---Yes Related visit to physician within the last 2 weeks? ---No Does the PT have any chronic conditions? (i.e. diabetes, asthma, etc.) ---No Is this a behavioral health or substance abuse call? ---No Guidelines Guideline Title Affirmed Question Affirmed Notes Neurologic Deficit [1] Numbness or tingling on both sides of body AND [2] is a new symptom present > 24 hours Final Disposition User See PCP When Office is Open (within 3 days) Corum, RN, Nicci Comments Appt on Saturday at 9:45 at the [facility] Caller Disagree/Comply Comply Caller Understands Yes PreDisposition Call Doctor

## 2016-10-06 ENCOUNTER — Encounter: Payer: Self-pay | Admitting: Family Medicine

## 2016-10-06 ENCOUNTER — Ambulatory Visit (INDEPENDENT_AMBULATORY_CARE_PROVIDER_SITE_OTHER): Payer: 59 | Admitting: Family Medicine

## 2016-10-06 VITALS — BP 122/76 | HR 73 | Temp 98.1°F | Wt 252.0 lb

## 2016-10-06 DIAGNOSIS — R202 Paresthesia of skin: Secondary | ICD-10-CM | POA: Diagnosis not present

## 2016-10-06 DIAGNOSIS — G5603 Carpal tunnel syndrome, bilateral upper limbs: Secondary | ICD-10-CM

## 2016-10-06 NOTE — Progress Notes (Signed)
OFFICE VISIT  10/06/2016   CC:  Chief Complaint  Patient presents with  . Fatigue  . Numbness    pt reports he was seen for the same x 1 yr ago...bilateral hand and arm--x 1-2 weeks... with tingling... pt reports it is usually when he wakes up...pt reports this numbness lasted all day yesterday   HPI:    Patient is a 50 y.o.  male who presents for symptoms that have been occurring intermittently for quite a while. Describes waking up in middle of night x 2-3 weeks with both forearms feeling "asleep" with additional tingling in hands. Yesterday, all day he felt his symptoms until 5-6 pm.  Then left arm then felt a little sore.  No arm, wrist, or fingers weakness.  No color change in arms or hands. Happens from elbows down into hands--entire hands, esp fingers.  No neck pain. He says he does play video games a lot.  Was seen for similar problem 1 yr ago.  Right arm was giving him similar sx's. Also was having this initially in 2010-was doing a lot of physical labor and R arm sx's started at that time. He denies ever being told a dx, never rx'd med, never saw specialist.  No testing was done except some blood work in 03/2016.  Past Medical History:  Diagnosis Date  . Allergy   . Asthma mild   no inhaler  . Gallbladder polyp 2018   62mm polyp  . GERD (gastroesophageal reflux disease) occasional   watches diet and takes diet  . Hemorrhoids    h/o Cscope   . History of MRSA infection few yrs ago while in hospital in [place]  . Meniscus tear 8-12   to have surgery-- left knee  . Urolithiasis 2007-2008   [place] left kidney  3 surgeries ; has passed stones since then     Past Surgical History:  Procedure Laterality Date  . CYSTOSCOPY/RETROGRADE/URETEROSCOPY/STONE EXTRACTION WITH BASKET  2007-- x2  . KNEE ARTHROSCOPY  12/05/2010   Procedure: ARTHROSCOPY KNEE;  Surgeon: Laurice Record Aplington;  Location: [facility];  Service: Orthopedics;  Laterality: Left;  LEFT KNEE  ARTHROSCOPY WITH PARTIAL MEDIAL  MENISECTOMY, Total lateral menisectomy, Shaving of Medial Femoral condyle  . leg surgery  41- age 5   Left , s/p traction, body cast    Outpatient Medications Prior to Visit  Medication Sig Dispense Refill  . pantoprazole (PROTONIX) 40 MG tablet Take 1 tablet (40 mg total) by mouth daily before breakfast. 30 tablet 4   No facility-administered medications prior to visit.     Allergies  Allergen Reactions  . Contrast Media [Iodinated Diagnostic Agents] Hives  . Ioxaglate Hives  . Sulfa Antibiotics Hives  . Amaranth (Fd&C Red #2) Hives    ROS As per HPI  PE: Blood pressure 122/76, pulse 73, temperature 98.1 F (36.7 C), temperature source Oral, weight 252 lb (114.3 kg), SpO2 97 %. Gen: Alert, well appearing.  Patient is oriented to person, place, time, and situation. Neuro: CN 2-12 intact bilaterally, strength 5/5 in proximal and distal upper extremities and lower extremities bilaterally.  No sensory deficits.  No tremor.   No ataxia.  Phalen's testing was + bilat, with tingling sensation in all fingers except 5th digit. Tinels testing + on L in typical median nerve distribution. + on R in all fingers except 5th digit. No reproduction of sx's with tinel's testing at ulnar groove of either elbow. No hand/finger muscle wasting.  LABS:  Chemistry      Component Value Date/Time   NA 141 03/20/2016 1548   K 4.3 03/20/2016 1548   CL 105 03/20/2016 1548   CO2 30 03/20/2016 1548   BUN 13 03/20/2016 1548   CREATININE 1.29 03/20/2016 1548      Component Value Date/Time   CALCIUM 9.7 03/20/2016 1548   ALKPHOS 69 03/20/2016 1548   AST 20 03/20/2016 1548   ALT 14 03/20/2016 1548   BILITOT 0.5 03/20/2016 1548    GFR 03/20/16=63  Lab Results  Component Value Date   WBC 11.5 (H) 03/20/2016   HGB 16.3 03/20/2016   HCT 48.6 03/20/2016   MCV 90.2 03/20/2016   PLT 206.0 03/20/2016   Lab Results  Component Value Date   TSH 2.83 09/22/2012    IMPRESSION AND PLAN:  1) Bilat forearms/hands paresthesias: suspect bilat CTS. Discussed dx in depth with pt today.  Told him his excessive wrist and finger movements with video game playing is likely contributing to this. Fitted/dispensed wrist splint for each wrist.  Encouraged pt to wear these during sleep for the next 1 mo, and also for 4-6 hours per day for the next week. Decrease time spent playing video games.  Spent 25 min with pt today, with >50% of this time spent in counseling and care coordination regarding the above problems.  An After Visit Summary was printed and given to the patient.  FOLLOW UP: Return if symptoms worsen or fail to improve.  Signed:  Crissie Sickles, MD           10/06/2016

## 2016-10-10 ENCOUNTER — Encounter: Payer: Self-pay | Admitting: Internal Medicine

## 2016-10-10 DIAGNOSIS — K824 Cholesterolosis of gallbladder: Secondary | ICD-10-CM

## 2016-10-11 MED ORDER — PANTOPRAZOLE SODIUM 40 MG PO TBEC: 40.0000 mg | DELAYED_RELEASE_TABLET | Freq: Every day | ORAL | 3 refills | Status: DC

## 2016-10-11 NOTE — Telephone Encounter (Signed)
See patient message  Received: Today  Message Contents  Colon Branch, MD  Damita Dunnings, [place]        Needs ultrasound, at [facility].  Okay to refill pantoprazole for a year

## 2016-10-11 NOTE — Telephone Encounter (Signed)
US abdomen ordered at [facility], pantoprazole refilled for 1 year.

## 2016-10-18 ENCOUNTER — Encounter: Payer: Self-pay | Admitting: Internal Medicine

## 2016-11-14 ENCOUNTER — Ambulatory Visit (INDEPENDENT_AMBULATORY_CARE_PROVIDER_SITE_OTHER): Payer: 59 | Admitting: Internal Medicine

## 2016-11-14 ENCOUNTER — Encounter: Payer: Self-pay | Admitting: Internal Medicine

## 2016-11-14 VITALS — BP 126/74 | HR 89 | Temp 98.4°F | Resp 14 | Ht 74.0 in | Wt 255.4 lb

## 2016-11-14 DIAGNOSIS — Z1211 Encounter for screening for malignant neoplasm of colon: Secondary | ICD-10-CM | POA: Diagnosis not present

## 2016-11-14 DIAGNOSIS — Z114 Encounter for screening for human immunodeficiency virus [HIV]: Secondary | ICD-10-CM | POA: Diagnosis not present

## 2016-11-14 DIAGNOSIS — Z0000 Encounter for general adult medical examination without abnormal findings: Secondary | ICD-10-CM

## 2016-11-14 DIAGNOSIS — J329 Chronic sinusitis, unspecified: Secondary | ICD-10-CM

## 2016-11-14 LAB — CBC WITH DIFFERENTIAL/PLATELET
BASOS ABS: 0 10*3/uL (ref 0.0–0.1)
BASOS PCT: 0.5 % (ref 0.0–3.0)
EOS ABS: 0.2 10*3/uL (ref 0.0–0.7)
Eosinophils Relative: 2 % (ref 0.0–5.0)
HCT: 49.1 % (ref 39.0–52.0)
Hemoglobin: 16.3 g/dL (ref 13.0–17.0)
LYMPHS PCT: 24.2 % (ref 12.0–46.0)
Lymphs Abs: 2.2 10*3/uL (ref 0.7–4.0)
MCHC: 33.3 g/dL (ref 30.0–36.0)
MCV: 91.5 fl (ref 78.0–100.0)
Monocytes Absolute: 0.6 10*3/uL (ref 0.1–1.0)
Monocytes Relative: 6.2 % (ref 3.0–12.0)
NEUTROS PCT: 67.1 % (ref 43.0–77.0)
Neutro Abs: 6 10*3/uL (ref 1.4–7.7)
Platelets: 171 10*3/uL (ref 150.0–400.0)
RBC: 5.37 Mil/uL (ref 4.22–5.81)
RDW: 13.8 % (ref 11.5–15.5)
WBC: 9 10*3/uL (ref 4.0–10.5)

## 2016-11-14 LAB — COMPREHENSIVE METABOLIC PANEL
ALBUMIN: 4.2 g/dL (ref 3.5–5.2)
ALT: 14 U/L (ref 0–53)
AST: 16 U/L (ref 0–37)
Alkaline Phosphatase: 69 U/L (ref 39–117)
BUN: 14 mg/dL (ref 6–23)
CHLORIDE: 106 meq/L (ref 96–112)
CO2: 30 mEq/L (ref 19–32)
CREATININE: 0.93 mg/dL (ref 0.40–1.50)
Calcium: 9.4 mg/dL (ref 8.4–10.5)
GFR: 91.75 mL/min (ref >60.00)
GLUCOSE: 102 mg/dL — AB (ref 70–99)
Potassium: 4.6 mEq/L (ref 3.5–5.1)
SODIUM: 143 meq/L (ref 135–145)
TOTAL PROTEIN: 7.1 g/dL (ref 6.0–8.3)
Total Bilirubin: 0.6 mg/dL (ref 0.2–1.2)

## 2016-11-14 LAB — LIPID PANEL
CHOLESTEROL: 124 mg/dL (ref 0–200)
HDL: 32.7 mg/dL — ABNORMAL LOW (ref >39.00)
LDL CALC: 75 mg/dL (ref 0–99)
NONHDL: 91.27
Total CHOL/HDL Ratio: 4
Triglycerides: 79 mg/dL (ref 0.0–149.0)
VLDL: 15.8 mg/dL (ref 0.0–40.0)

## 2016-11-14 LAB — TSH: TSH: 1.58 u[IU]/mL (ref 0.35–4.50)

## 2016-11-14 MED ORDER — AZELASTINE HCL 0.1 % NA SOLN: 2.0000 | Freq: Two times a day (BID) | NASAL | 12 refills | Status: DC

## 2016-11-14 NOTE — Patient Instructions (Addendum)
GO TO THE LAB : Get the blood work     GO TO THE FRONT DESK Schedule your next appointment for a  physical exam in one year.  Next gallbladder ultrasound April 2019    For allergies: Referring you to ENT doctor Flonase 2 sprays on the side of the nose daily Astelin 2 sprays twice a day

## 2016-11-14 NOTE — Progress Notes (Signed)
Subjective:    Patient ID: Mario Jimenez, male    DOB: Jan 07, 1966, 51 y.o.   MRN: [id number]  DOS:  11/14/2016 Type of visit - description : cpx Interval history: Has several symptoms, see below   Review of Systems "Allergies" started 4 days ago with sneezing, dry cough, severe postnasal dripping, itchy eyes and nose. Denies fever chills. No wheezing. Also continue with chest pain, symptoms are not exertional, happens every 2-3 months, usually located at the right side of the chest and is described as sharp, lasting less than 5 minutes. Sx going on x at least one year. Last week for a couple of days saw red blood on top of the stools with bowel movements. Denies any rectal pain or itching. History of hemorrhoids. This is not the first episode.  Other than above, a 14 point review of systems is negative     Past Medical History:  Diagnosis Date  . Allergy   . Asthma mild   no inhaler  . Gallbladder polyp 2018   67mm polyp  . GERD (gastroesophageal reflux disease) occasional   watches diet and takes diet  . Hemorrhoids    h/o Cscope   . History of MRSA infection few yrs ago while in hospital in [place]  . Meniscus tear 8-12   to have surgery-- left knee  . Urolithiasis 2007-2008   [place] left kidney  3 surgeries ; has passed stones since then     Past Surgical History:  Procedure Laterality Date  . CYSTOSCOPY/RETROGRADE/URETEROSCOPY/STONE EXTRACTION WITH BASKET  2007-- x2  . KNEE ARTHROSCOPY  12/05/2010   Procedure: ARTHROSCOPY KNEE;  Surgeon: Laurice Record Aplington;  Location: [facility];  Service: Orthopedics;  Laterality: Left;  LEFT KNEE ARTHROSCOPY WITH PARTIAL MEDIAL  MENISECTOMY, Total lateral menisectomy, Shaving of Medial Femoral condyle  . leg surgery  50- age 18   Left , s/p traction, body cast   Family History  Problem Relation Age of Onset  . Coronary artery disease Father        F, MI 51 y/o  . Stroke Mother        M d/t brain tumor   .  Cancer Mother        M brain tumor  . Colon cancer Maternal Grandmother   . Diabetes Neg Hx   . Prostate cancer Neg Hx     Social History   Social History  . Marital status: Single    Spouse name: N/A  . Number of children: 0  . Years of education: N/A   Occupational History  . Freight forwarder , [employer]    Social History Main Topics  . Smoking status: Current Every Day Smoker    Last attempt to quit: 09/19/2007  . Smokeless tobacco: Never Used     Comment:  1 ppd  . Alcohol use Yes     Comment: social  . Drug use: No  . Sexual activity: Not on file   Other Topics Concern  . Not on file   Social History Narrative   From [place], used to live in [place] x a while, get back to [facility] 2009   lives by himself                   Allergies as of 11/14/2016      Reactions   Contrast Media [iodinated Diagnostic Agents] Hives   Ioxaglate Hives   Sulfa Antibiotics Hives   Amaranth (fd&c Red #2) Hives  Medication List       Accurate as of 11/14/16 11:59 PM. Always use your most recent med list.          azelastine 0.1 % nasal spray Commonly known as:  ASTELIN Place 2 sprays into both nostrils 2 (two) times daily. Use in each nostril as directed   pantoprazole 40 MG tablet Commonly known as:  PROTONIX Take 1 tablet (40 mg total) by mouth daily before breakfast.          Objective:   Physical Exam BP 126/74 (BP Location: Left Arm, Patient Position: Sitting, Cuff Size: Normal)   Pulse 89   Temp 98.4 F (36.9 C) (Oral)   Resp 14   Ht 6\' 2"  (1.88 m)   Wt 255 lb 6 oz (115.8 kg)   SpO2 96%   BMI 32.79 kg/m   General:   Well developed, well nourished . NAD.  Neck: No  thyromegaly  HEENT:  Normocephalic . Face symmetric, atraumatic. Nose quite congested, he seems to have very large turbinates, + nasal voice.  throat: Crowded, no red or swollen. Sinuses no TTP Lungs:  CTA B Normal respiratory effort, no intercostal retractions, no accessory muscle use. Heart:  RRR,  no murmur.  No pretibial edema bilaterally  Abdomen:  Not distended, soft, non-tender. No rebound or rigidity.   Skin: Exposed areas without rash. Not pale. Not jaundice Neurologic:  alert & oriented X3.  Speech normal, gait appropriate for age and unassisted Strength symmetric and appropriate for age.  Psych: Cognition and judgment appear intact.  Cooperative with normal attention span and concentration.  Behavior appropriate. No anxious or depressed appearing.    Assessment & Plan:   Assessment Asthma GERD Chronic sinusitis, Allergies Urolithiasis GB polyp, saw surgery/2018, rec a [place] in 6 months. Surgery if polyp >  10 mm Smoker, 1 ppd  PLAN: GB polyp: f/u US 10/2016, size 7 mm, rec repeat in 6 months. Asthma: Despite allergies does not seem to be wheezing Chronic sinusitis, allergies: On exam, he seems to have very large turbinates, mouth breathing. Rec Flonase, Astelin, refer to ENT;  polyps? Snoring: Admits to snoring ; epworth scale scored 4, negative Slightly elevated white count: Recheck a CBC today. Chest pain: As described above, chronic, not likely cardiac, call if something changes. RBPR: See ROS, likely a distal, benign condition. We are referring him for a screening colonoscopy RTC one year

## 2016-11-14 NOTE — Progress Notes (Signed)
Pre visit review using our clinic review tool, if applicable. No additional management support is needed unless otherwise documented below in the visit note. 

## 2016-11-14 NOTE — Assessment & Plan Note (Addendum)
-  Td 09-2011; declined flu shot -UXL:KGMWNU 2006 @ [place] (d/t diverticulitis?)--- next  Due, refer to GI  -prostate ca screening: at age 51 -Tobacco abuse: not ready to quit, risks, Wellbutrin, Chantix discuss. -Diet and exercise discussed -Labs  : CMP, FLP, CBC, TSH, HIV

## 2016-11-15 LAB — HIV ANTIBODY (ROUTINE TESTING W REFLEX): HIV 1&2 Ab, 4th Generation: NONREACTIVE

## 2016-11-15 NOTE — Assessment & Plan Note (Signed)
GB polyp: f/u US 10/2016, size 7 mm, rec repeat in 6 months. Asthma: Despite allergies does not seem to be wheezing Chronic sinusitis, allergies: On exam, he seems to have very large turbinates, mouth breathing. Rec Flonase, Astelin, refer to ENT;  polyps? Snoring: Admits to snoring ; epworth scale scored 4, negative Slightly elevated white count: Recheck a CBC today. Chest pain: As described above, chronic, not likely cardiac, call if something changes. RBPR: See ROS, likely a distal, benign condition. We are referring him for a screening colonoscopy RTC one year

## 2016-11-28 ENCOUNTER — Encounter: Payer: Self-pay | Admitting: Internal Medicine

## 2016-12-14 ENCOUNTER — Encounter: Payer: Self-pay | Admitting: Internal Medicine

## 2016-12-18 ENCOUNTER — Telehealth: Payer: Self-pay

## 2016-12-18 DIAGNOSIS — Z111 Encounter for screening for respiratory tuberculosis: Secondary | ICD-10-CM

## 2016-12-18 NOTE — Telephone Encounter (Signed)
Orders printed and placed at front desk for pick up.

## 2016-12-18 NOTE — Telephone Encounter (Signed)
Copied from [place]. Topic: Inquiry >> Dec 17, 2016  4:46 PM Conception Chancy, NT wrote: Reason for CRM: for pt job he is needing the tb test quantiferon done and he wants the orders sent to [facility]. Please contact pt if this is not possible

## 2016-12-18 NOTE — Telephone Encounter (Signed)
Pt returned call- would like to pick up orders.

## 2016-12-18 NOTE — Telephone Encounter (Signed)
LMOM informing Pt I will need to know the fax number of the [facility] he is wanting to go to or I can place order requisition at the front desk for him to pick up.

## 2017-01-05 LAB — QUANTIFERON-TB GOLD PLUS
QUANTIFERON NIL VALUE: 0.01 [IU]/mL
QUANTIFERON-TB GOLD PLUS: NEGATIVE
QuantiFERON TB1 Ag Value: 0.03 IU/mL
QuantiFERON TB2 Ag Value: 0.03 IU/mL

## 2017-01-09 ENCOUNTER — Telehealth: Payer: Self-pay | Admitting: *Deleted

## 2017-01-09 NOTE — Telephone Encounter (Signed)
Received results from [facility]; forwarded to provider/SLS 12/26   

## 2017-06-05 ENCOUNTER — Ambulatory Visit (HOSPITAL_BASED_OUTPATIENT_CLINIC_OR_DEPARTMENT_OTHER): Payer: 59

## 2017-06-05 ENCOUNTER — Encounter: Payer: Self-pay | Admitting: Family

## 2017-06-05 ENCOUNTER — Encounter: Payer: Self-pay | Admitting: Internal Medicine

## 2017-06-05 ENCOUNTER — Ambulatory Visit: Payer: 59 | Admitting: Family

## 2017-06-05 VITALS — BP 118/64 | HR 88 | Temp 98.2°F | Ht 74.0 in | Wt 252.0 lb

## 2017-06-05 DIAGNOSIS — R350 Frequency of micturition: Secondary | ICD-10-CM | POA: Diagnosis not present

## 2017-06-05 DIAGNOSIS — R109 Unspecified abdominal pain: Secondary | ICD-10-CM

## 2017-06-05 LAB — POCT URINALYSIS DIPSTICK
BILIRUBIN UA: NEGATIVE
Glucose, UA: NEGATIVE
Ketones, UA: NEGATIVE
Leukocytes, UA: NEGATIVE
Nitrite, UA: NEGATIVE
PH UA: 6 (ref 5.0–8.0)
Protein, UA: NEGATIVE
Spec Grav, UA: >=1.03 — AB (ref 1.010–1.025)
UROBILINOGEN UA: 0.2 U/dL

## 2017-06-05 MED ORDER — PANTOPRAZOLE SODIUM 40 MG PO TBEC: 40.0000 mg | DELAYED_RELEASE_TABLET | Freq: Every day | ORAL | 3 refills | Status: DC

## 2017-06-05 MED ORDER — ALBUTEROL SULFATE HFA 108 (90 BASE) MCG/ACT IN AERS: 2.0000 | INHALATION_SPRAY | Freq: Four times a day (QID) | RESPIRATORY_TRACT | 0 refills | Status: DC | PRN

## 2017-06-05 MED ORDER — CIPROFLOXACIN HCL 500 MG PO TABS: 500.0000 mg | ORAL_TABLET | Freq: Two times a day (BID) | ORAL | 0 refills | Status: AC

## 2017-06-05 NOTE — Progress Notes (Signed)
Mario Jimenez is a 52 y.o. male with the following history as recorded in EpicCare:  Patient Active Problem List   Diagnosis Date Noted  . PCP NOTES >>>>>>>>>>>>>>>>>. 03/21/2016  . Rhinitis 09/24/2013  . Hemorrhoids 08/16/2011  . Annual physical exam 09/19/2010  . Urolithiasis     Current Outpatient Medications  Medication Sig Dispense Refill  . azelastine (ASTELIN) 0.1 % nasal spray Place 2 sprays into both nostrils 2 (two) times daily. Use in each nostril as directed 30 mL 12  . pantoprazole (PROTONIX) 40 MG tablet Take 1 tablet (40 mg total) by mouth daily before breakfast. Use as needed 90 tablet 3  . albuterol (PROVENTIL HFA;VENTOLIN HFA) 108 (90 Base) MCG/ACT inhaler Inhale 2 puffs into the lungs every 6 (six) hours as needed for wheezing or shortness of breath. 1 Inhaler 0  . ciprofloxacin (CIPRO) 500 MG tablet Take 1 tablet (500 mg total) by mouth 2 (two) times daily for 10 days. 20 tablet 0   No current facility-administered medications for this visit.     Allergies: Contrast media [iodinated diagnostic agents]; Ioxaglate; Sulfa antibiotics; and Amaranth (fd&c red #2)  Past Medical History:  Diagnosis Date  . Allergy   . Asthma mild   no inhaler  . Gallbladder polyp 2018   40mm polyp  . GERD (gastroesophageal reflux disease) occasional   watches diet and takes diet  . Hemorrhoids    h/o Cscope   . History of MRSA infection few yrs ago while in hospital in [place]  . Meniscus tear 8-12   to have surgery-- left knee  . Urolithiasis 2007-2008   ([place] left kidney  3 surgeries ; has passed stones since then     Past Surgical History:  Procedure Laterality Date  . CYSTOSCOPY/RETROGRADE/URETEROSCOPY/STONE EXTRACTION WITH BASKET  2007-- x2  . KNEE ARTHROSCOPY  12/05/2010   Procedure: ARTHROSCOPY KNEE;  Surgeon: Laurice Record Aplington;  Location: [facility];  Service: Orthopedics;  Laterality: Left;  LEFT KNEE ARTHROSCOPY WITH PARTIAL MEDIAL  MENISECTOMY, Total  lateral menisectomy, Shaving of Medial Femoral condyle  . leg surgery  42- age 31   Left , s/p traction, body cast    Family History  Problem Relation Age of Onset  . Coronary artery disease Father        F, MI 66 y/o  . Stroke Mother        M d/t brain tumor   . Cancer Mother        M brain tumor  . Colon cancer Maternal Grandmother   . Diabetes Neg Hx   . Prostate cancer Neg Hx     Social History   Tobacco Use  . Smoking status: Current Every Day Smoker    Last attempt to quit: 09/19/2007    Years since quitting: 9.7  . Smokeless tobacco: Never Used  . Tobacco comment:  1 ppd  Substance Use Topics  . Alcohol use: Yes    Comment: social    Subjective:  Patient presents with concerns for urinary frequency x 8 days; complaining of sensation of incomplete emptying of bladder; + known history of kidney stones; no prior history of prostate infection; has not seen any blood in urine; no known fever or back pain; in the past when he has experienced urinary hesitancy and frequency, it has been due to a kidney stone; was told in 2017 that did have a least one kidney stone in his left kidney; has required lithotripsy with urology in the  past due to size of his kidney stones;   Objective:  Vitals:   06/05/17 1419  BP: 118/64  Pulse: 88  Temp: 98.2 F (36.8 C)  TempSrc: Oral  SpO2: 95%  Weight: 252 lb (114.3 kg)  Height: 6\' 2"  (1.88 m)    General: Well developed, well nourished, in no acute distress  Skin : Warm and dry.  Head: Normocephalic and atraumatic  Eyes: Sclera and conjunctiva clear; pupils round and reactive to light; extraocular movements intact  Ears: External normal; canals clear; tympanic membranes normal  Oropharynx: Pink, supple. No suspicious lesions  Neck: Supple without thyromegaly, adenopathy  Lungs: Respirations unlabored; wheezing noted on exam CVS exam: normal rate and regular rhythm.  Musculoskeletal: No deformities; no active joint inflammation;  negative CVA tenderness Extremities: No edema, cyanosis, clubbing  Vessels: Symmetric bilaterally  Neurologic: Alert and oriented; speech intact; face symmetrical; moves all extremities well; CNII-XII intact without focal deficit  Assessment:  1. Urinary frequency   2. Abdominal pain, unspecified abdominal location     Plan:   U/A shows 1+ blood today; update CT to rule out stone- may need to refer back to urology; Rx for Cipro 500 mg bid x 10 days; follow-up to be determined;  He is also encouraged to see his PCP in follow-up for his chronic care needs.  No follow-ups on file.  Orders Placed This Encounter  Procedures  . CT RENAL STONE STUDY    Standing Status:   Future    Standing Expiration Date:   09/06/2018    Order Specific Question:   Preferred imaging location?    Answer:   [facility]    Order Specific Question:   Radiology Contrast Protocol - do NOT remove file path    Answer:   [email or web address]  . POCT urinalysis dipstick    Requested Prescriptions   Signed Prescriptions Disp Refills  . pantoprazole (PROTONIX) 40 MG tablet 90 tablet 3    Sig: Take 1 tablet (40 mg total) by mouth daily before breakfast. Use as needed  . albuterol (PROVENTIL HFA;VENTOLIN HFA) 108 (90 Base) MCG/ACT inhaler 1 Inhaler 0    Sig: Inhale 2 puffs into the lungs every 6 (six) hours as needed for wheezing or shortness of breath.  . ciprofloxacin (CIPRO) 500 MG tablet 20 tablet 0    Sig: Take 1 tablet (500 mg total) by mouth 2 (two) times daily for 10 days.

## 2017-06-05 NOTE — Patient Instructions (Signed)
Will be in touch once I get your CT results back; please plan to see Dr. Larose Kells in follow-up within the next 2 weeks;   [facility] at 4:30 pm for Ct

## 2017-06-06 ENCOUNTER — Telehealth: Payer: Self-pay

## 2017-06-06 ENCOUNTER — Encounter: Payer: Self-pay | Admitting: Gastroenterology

## 2017-06-06 ENCOUNTER — Ambulatory Visit: Payer: 59 | Admitting: Family

## 2017-06-06 ENCOUNTER — Encounter: Payer: Self-pay | Admitting: Internal Medicine

## 2017-06-06 ENCOUNTER — Telehealth: Payer: Self-pay | Admitting: Internal Medicine

## 2017-06-06 ENCOUNTER — Ambulatory Visit: Payer: 59 | Admitting: Internal Medicine

## 2017-06-06 DIAGNOSIS — N200 Calculus of kidney: Secondary | ICD-10-CM

## 2017-06-06 DIAGNOSIS — K5792 Diverticulitis of intestine, part unspecified, without perforation or abscess without bleeding: Secondary | ICD-10-CM

## 2017-06-06 MED ORDER — ONDANSETRON HCL 4 MG PO TABS: 4.0000 mg | ORAL_TABLET | Freq: Three times a day (TID) | ORAL | 0 refills | Status: DC | PRN

## 2017-06-06 MED ORDER — TAMSULOSIN HCL 0.4 MG PO CAPS: 0.4000 mg | ORAL_CAPSULE | Freq: Every day | ORAL | 0 refills | Status: DC

## 2017-06-06 MED ORDER — METRONIDAZOLE 500 MG PO TABS: 500.0000 mg | ORAL_TABLET | Freq: Three times a day (TID) | ORAL | 0 refills | Status: DC

## 2017-06-06 NOTE — Telephone Encounter (Signed)
Recd call from radiology [place]--Dr. Quita Skye Delgaizo---he has found unexpected findings on CT Rosann Auerbach ordered for patient yesterday---there is a stone at the distal right ureter, not obstructing right now, but if stone moves, could aggravate diverticulitis and cause sepsis---I have called laura,NP and gotten verbal order to call patient's urologist he has already seen (Dr. Felipa Eth, [facility]) to see if patient can be worked in either today or tomorrow---referral has been placed because of New [facility] and dr stoneking's nurse/assistant, patient should be ok to wait until first available appt which is June 11th at 2:30pm--I have contacted patient and explained findings, patient has been advised of conversation and appt made with dr stoneking---patient's pain is no worse than yesterday (today) and patient has been advised if pain worsens to call dr Felipa Eth, if that office is closed, go to ED to see emergency care--laura also calling in another abx patient is to add to regimen from yesterday--patient advised to start and complete both abx---dr Delgaizo's number is [phone] and dr stoneking's number is [phone] also advised to make follow up appt with dr. Larose Kells, his pcp to follow findings  Routing to laura,NP, fyi.Marland KitchenMarland KitchenMarland Kitchen

## 2017-06-06 NOTE — Telephone Encounter (Signed)
Copied from [place] [id number]. Topic: Quick Communication - See Telephone Encounter >> Jun 06, 2017  8:29 AM Boyd Kerbs wrote: CRM for notification. See Telephone encounter for: 06/06/17.  Can not get through to your office getting a fast busy. every other line no one answers.    Dr. Rozelle Logan on the phone wanting to speak to Dr. Larose Kells regarding pt. Mario Jimenez MRN [id number].  Please have Dr. Larose Kells call Ivin Booty (she will get dr for him)  for Dr. Rozelle Logan (radiology at [facility] ) [phone]

## 2017-06-06 NOTE — Telephone Encounter (Signed)
Send the following prescriptions: Flagyl 500 mg 1 tablet by mouth 3 times a day #30 no refills Zofran 4 mg 1 every tablet every 8 hours as needed #20 no refills Flomax  0.4 mg 1 tablet daily #30 no refills Also arrange a GI referral, DX diverticulitis, needs a follow-up colonoscopy ============= I spoke with the radiologist and review the office visit note from yesterday. He has urinary frequency, probably related to the kidney stone but also has evidence of either a mild diverticulitis or resolving diverticulitis. Plan:  Continue Cipro, add Flagyl and refer to GI for diverticulitis, needs a colonoscopy. Flomax to help with a kidney stone. Come back in 2 weeks to see me, call anytime if severe symptoms Patient verbalized understanding. ============== CT report:  1.4 mm distal right ureteral stone. No hydroureteronephrosis at this time. 2.Sigmoid colonic diverticulosis with mild acute versus sequelae of recent diverticulitis. Associated wall thickening may be accentuated by underdistention and/or prior inflammation. Consider nonemergent colonoscopy follow-up to exclude an underlying lesion.

## 2017-06-06 NOTE — Telephone Encounter (Signed)
Flagyl, Zofran, and Flomax sent to pharmacy. GI referral placed.

## 2017-06-07 NOTE — Telephone Encounter (Signed)
Per chart review, patient's PCP was also notified; he added the Flagyl I was planning to add and will be seeing patient in follow-up regarding the diverticulitis.

## 2017-06-12 ENCOUNTER — Telehealth: Payer: Self-pay | Admitting: *Deleted

## 2017-06-12 NOTE — Telephone Encounter (Signed)
Received CT Abdomen/Pelvis results from [facility]; forwarded to provider/SLS 05/29

## 2017-06-21 NOTE — Telephone Encounter (Signed)
error 

## 2017-06-24 ENCOUNTER — Encounter: Payer: Self-pay | Admitting: Internal Medicine

## 2017-06-24 ENCOUNTER — Ambulatory Visit: Payer: 59 | Admitting: Internal Medicine

## 2017-06-24 VITALS — BP 122/78 | HR 99 | Temp 97.9°F | Resp 16 | Ht 74.0 in | Wt 248.0 lb

## 2017-06-24 DIAGNOSIS — K5792 Diverticulitis of intestine, part unspecified, without perforation or abscess without bleeding: Secondary | ICD-10-CM | POA: Diagnosis not present

## 2017-06-24 DIAGNOSIS — N200 Calculus of kidney: Secondary | ICD-10-CM

## 2017-06-24 DIAGNOSIS — K824 Cholesterolosis of gallbladder: Secondary | ICD-10-CM | POA: Diagnosis not present

## 2017-06-24 MED ORDER — HYDROCORTISONE ACETATE 25 MG RE SUPP: 25.0000 mg | Freq: Two times a day (BID) | RECTAL | 1 refills | Status: DC | PRN

## 2017-06-24 NOTE — Patient Instructions (Signed)
GO TO THE LAB : Get the blood work     GO TO THE FRONT DESK Schedule your next appointment for a physical exam by October 2019   We will schedule a ultrasound of your kidneys to be sure the stone has passed

## 2017-06-24 NOTE — Progress Notes (Signed)
Subjective:    Patient ID: Mario Jimenez, male    DOB: [birth year], 52 y.o.   MRN: [id number]  DOS:  06/24/2017 Type of visit - description :  Interval history: Was seen elsewhere 06/05/2017, he c/o urinary frequency and abdominal pain, CT showed a 4 mm distal right ureteral stone but also changes consistent with mild or resolving diverticulitis. He was prescribed Cipro, I added Flagyl and Flomax..  See phone note. Here for follow-up  Took  both antibiotics without apparent problems. Gradually improve however on 06/17/2017 he had severe nausea and abdominal pain, he points to the lower abdomen.  Also several weeks history of red blood per rectum, associated to BMs, blood is on top but also  mixed with his stools and on the toilet paper. This problem started even before he was seen at the other place on 06/05/2017. He has a history of hemorrhoids and that is what he think it is.  However he denies any rectal pain or itching per se.   Wt Readings from Last 3 Encounters:  06/24/17 248 lb (112.5 kg)  06/05/17 252 lb (114.3 kg)  11/14/16 255 lb 6 oz (115.8 kg)    Review of Systems  No fever chills Currently with no gross hematuria or dysuria or difficulty urinating. He does not recall passing any stone since the CT last month. Some weight loss noted.   Past Medical History:  Diagnosis Date  . Allergy   . Asthma mild   no inhaler  . Gallbladder polyp 2018   78mm polyp  . GERD (gastroesophageal reflux disease) occasional   watches diet and takes diet  . Hemorrhoids    h/o Cscope   . History of MRSA infection few yrs ago while in hospital in [place]  . Meniscus tear 8-12   to have surgery-- left knee  . Urolithiasis 2007-2008   [place]  3 surgeries ; has passed stones since then     Past Surgical History:  Procedure Laterality Date  . CYSTOSCOPY/RETROGRADE/URETEROSCOPY/STONE EXTRACTION WITH BASKET  2007-- x2  . KNEE ARTHROSCOPY  12/05/2010   Procedure: ARTHROSCOPY  KNEE;  Surgeon: Laurice Record Aplington;  Location: [facility];  Service: Orthopedics;  Laterality: Left;  LEFT KNEE ARTHROSCOPY WITH PARTIAL MEDIAL  MENISECTOMY, Total lateral menisectomy, Shaving of Medial Femoral condyle  . leg surgery  32- age 52   Left , s/p traction, body cast    Social History   Socioeconomic History  . Marital status: Single    Spouse name: Not on file  . Number of children: 0  . Years of education: Not on file  . Highest education level: Not on file  Occupational History  . Occupation: Freight forwarder , Curator  Social Needs  . Financial resource strain: Not on file  . Food insecurity:    Worry: Not on file    Inability: Not on file  . Transportation needs:    Medical: Not on file    Non-medical: Not on file  Tobacco Use  . Smoking status: Current Every Day Smoker    Last attempt to quit: 09/19/2007    Years since quitting: 9.7  . Smokeless tobacco: Never Used  . Tobacco comment:  1 ppd  Substance and Sexual Activity  . Alcohol use: Yes    Comment: social  . Drug use: No  . Sexual activity: Not on file  Lifestyle  . Physical activity:    Days per week: Not on file  Minutes per session: Not on file  . Stress: Not on file  Relationships  . Social connections:    Talks on phone: Not on file    Gets together: Not on file    Attends religious service: Not on file    Active member of club or organization: Not on file    Attends meetings of clubs or organizations: Not on file    Relationship status: Not on file  . Intimate partner violence:    Fear of current or ex partner: Not on file    Emotionally abused: Not on file    Physically abused: Not on file    Forced sexual activity: Not on file  Other Topics Concern  . Not on file  Social History Narrative   From [place], used to live [place] x a while, get back to [facility] 2009   lives by himself                   Allergies as of 06/24/2017      Reactions   Contrast Media [iodinated  Diagnostic Agents] Hives   Ioxaglate Hives   Sulfa Antibiotics Hives   Amaranth (fd&c Red #2) Hives      Medication List        Accurate as of 06/24/17  6:31 PM. Always use your most recent med list.          albuterol 108 (90 Base) MCG/ACT inhaler Commonly known as:  PROVENTIL HFA;VENTOLIN HFA Inhale 2 puffs into the lungs every 6 (six) hours as needed for wheezing or shortness of breath.   azelastine 0.1 % nasal spray Commonly known as:  ASTELIN Place 2 sprays into both nostrils 2 (two) times daily. Use in each nostril as directed   hydrocortisone 25 MG suppository Commonly known as:  ANUSOL-HC Place 1 suppository (25 mg total) rectally 2 (two) times daily as needed for hemorrhoids.   ondansetron 4 MG tablet Commonly known as:  ZOFRAN Take 1 tablet (4 mg total) by mouth every 8 (eight) hours as needed for nausea or vomiting.   pantoprazole 40 MG tablet Commonly known as:  PROTONIX Take 1 tablet (40 mg total) by mouth daily before breakfast. Use as needed   tamsulosin 0.4 MG Caps capsule Commonly known as:  FLOMAX Take 1 capsule (0.4 mg total) by mouth daily after supper.          Objective:   Physical Exam BP 122/78 (BP Location: Left Arm, Patient Position: Sitting, Cuff Size: Large)   Pulse 99   Temp 97.9 F (36.6 C) (Oral)   Resp 16   Ht 6\' 2"  (1.88 m)   Wt 248 lb (112.5 kg)   SpO2 95%   BMI 31.84 kg/m  General:   Well developed, well nourished . NAD.  HEENT:  Normocephalic . Face symmetric, atraumatic Lungs:  CTA B Normal respiratory effort, no intercostal retractions, no accessory muscle use. Heart: RRR,  no murmur.  no pretibial edema bilaterally  Abdomen:  Not distended, soft, non-tender. No rebound or rigidity.   Skin: Not pale. Not jaundice Neurologic:  alert & oriented X3.  Speech normal, gait appropriate for age and unassisted Psych--  Cognition and judgment appear intact.  Cooperative with normal attention span and concentration.    Behavior appropriate. No anxious or depressed appearing.     Assessment & Plan:   Assessment Asthma GERD Chronic sinusitis, Allergies Urolithiasis GB polyp, saw surgery/2018, rec a [place] in 6 months. Surgery if polyp >  10 mm Smoker, 1 ppd  PLAN: Urolithiasis: Per CT 06/06/2017,  4 mm distal right ureteral stone, had severe nausea and abdominal pain 06/17/2017, probably passed a stone but I'm not sure.  Will get a  BMP, to see urology tomorrow Diverticulitis: Per CT 06/06/2017, see care everywhere, status post antibiotics, currently with no fever chills.  Has an appointment to see GI already.  We will get a CBC Red blood per rectum: To see GI soon.  Request hemorrhoid suppositories, sent Gallbladder polyp:had a [place] at [facility] 10-2017 CPX   Today, I spent more than 25  min with the patient: >50% of the time counseling regards urolithiasis, need to check his renal function Also: -reviewing the chart and labs ordered by other providers

## 2017-06-24 NOTE — Assessment & Plan Note (Signed)
Urolithiasis: Per CT 06/06/2017,  4 mm distal right ureteral stone, had severe nausea and abdominal pain 06/17/2017, probably passed a stone but I'm not sure.  Will get a  BMP, to see urology tomorrow Diverticulitis: Per CT 06/06/2017, see care everywhere, status post antibiotics, currently with no fever chills.  Has an appointment to see GI already.  We will get a CBC Red blood per rectum: To see GI soon.  Request hemorrhoid suppositories, sent Gallbladder polyp:had a [place] at [facility] RTC 10-2017 CPX

## 2017-06-25 LAB — CBC
HEMATOCRIT: 47.5 % (ref 39.0–52.0)
HEMOGLOBIN: 15.9 g/dL (ref 13.0–17.0)
MCHC: 33.5 g/dL (ref 30.0–36.0)
MCV: 90 fl (ref 78.0–100.0)
Platelets: 210 10*3/uL (ref 150.0–400.0)
RBC: 5.28 Mil/uL (ref 4.22–5.81)
RDW: 14 % (ref 11.5–15.5)
WBC: 12.8 10*3/uL — AB (ref 4.0–10.5)

## 2017-06-25 LAB — BASIC METABOLIC PANEL
BUN: 10 mg/dL (ref 6–23)
CHLORIDE: 103 meq/L (ref 96–112)
CO2: 28 meq/L (ref 19–32)
Calcium: 9.1 mg/dL (ref 8.4–10.5)
Creatinine, Ser: 1.08 mg/dL (ref 0.40–1.50)
GFR: 77.02 mL/min (ref >60.00)
Glucose, Bld: 86 mg/dL (ref 70–99)
POTASSIUM: 4 meq/L (ref 3.5–5.1)
SODIUM: 138 meq/L (ref 135–145)

## 2017-07-11 ENCOUNTER — Encounter: Payer: Self-pay | Admitting: Family

## 2017-07-11 ENCOUNTER — Ambulatory Visit (INDEPENDENT_AMBULATORY_CARE_PROVIDER_SITE_OTHER): Payer: 59 | Admitting: Gastroenterology

## 2017-07-11 ENCOUNTER — Ambulatory Visit (INDEPENDENT_AMBULATORY_CARE_PROVIDER_SITE_OTHER): Payer: 59 | Admitting: Family

## 2017-07-11 ENCOUNTER — Encounter: Payer: Self-pay | Admitting: Gastroenterology

## 2017-07-11 VITALS — BP 130/66 | HR 74 | Ht 74.0 in | Wt 245.0 lb

## 2017-07-11 VITALS — BP 116/72 | HR 84 | Temp 98.3°F | Resp 16 | Ht 74.0 in | Wt 245.0 lb

## 2017-07-11 DIAGNOSIS — Z8719 Personal history of other diseases of the digestive system: Secondary | ICD-10-CM

## 2017-07-11 DIAGNOSIS — J209 Acute bronchitis, unspecified: Secondary | ICD-10-CM | POA: Diagnosis not present

## 2017-07-11 DIAGNOSIS — K625 Hemorrhage of anus and rectum: Secondary | ICD-10-CM

## 2017-07-11 DIAGNOSIS — H6691 Otitis media, unspecified, right ear: Secondary | ICD-10-CM

## 2017-07-11 MED ORDER — PREDNISONE 10 MG PO TABS: ORAL_TABLET | ORAL | 0 refills | Status: DC

## 2017-07-11 MED ORDER — AZITHROMYCIN 250 MG PO TABS: ORAL_TABLET | ORAL | 0 refills | Status: DC

## 2017-07-11 NOTE — Progress Notes (Signed)
Subjective:    Patient ID: Mario Jimenez, male    DOB: 30-Sep-1965, 52 y.o.   MRN: [id number]  HPI  Patient is a 52 yr old male who presents today with chief complaint of chest congestion/cough. Symptoms started 8-10 days ago. Denies fever. Energy is low.  Had sore throat for a few days but this resolved. Denies otalgia.    Review of Systems See HPI  Past Medical History:  Diagnosis Date  . Allergy   . Asthma mild   no inhaler  . Gallbladder polyp 2018   68mm polyp  . GERD (gastroesophageal reflux disease) occasional   watches diet and takes diet  . Hemorrhoids    h/o Cscope   . History of MRSA infection few yrs ago while in hospital in [place]  . Meniscus tear 8-12   to have surgery-- left knee  . Urolithiasis 2007-2008   [place] left kidney  3 surgeries ; has passed stones since then      Social History   Socioeconomic History  . Marital status: Single    Spouse name: Not on file  . Number of children: 0  . Years of education: Not on file  . Highest education level: Not on file  Occupational History  . Occupation: Freight forwarder , Curator  Social Needs  . Financial resource strain: Not on file  . Food insecurity:    Worry: Not on file    Inability: Not on file  . Transportation needs:    Medical: Not on file    Non-medical: Not on file  Tobacco Use  . Smoking status: Current Every Day Smoker    Last attempt to quit: 09/19/2007    Years since quitting: 9.8  . Smokeless tobacco: Never Used  . Tobacco comment:  1 ppd  Substance and Sexual Activity  . Alcohol use: Yes    Comment: social  . Drug use: No  . Sexual activity: Not on file  Lifestyle  . Physical activity:    Days per week: Not on file    Minutes per session: Not on file  . Stress: Not on file  Relationships  . Social connections:    Talks on phone: Not on file    Gets together: Not on file    Attends religious service: Not on file    Active member of club or organization: Not on file    Attends  meetings of clubs or organizations: Not on file    Relationship status: Not on file  . Intimate partner violence:    Fear of current or ex partner: Not on file    Emotionally abused: Not on file    Physically abused: Not on file    Forced sexual activity: Not on file  Other Topics Concern  . Not on file  Social History Narrative   From [place], [place] live [place] while, get back to [facility] 2009   lives by himself                 Past Surgical History:  Procedure Laterality Date  . CYSTOSCOPY/RETROGRADE/URETEROSCOPY/STONE EXTRACTION WITH BASKET  2007-- x2  . KNEE ARTHROSCOPY  12/05/2010   Procedure: ARTHROSCOPY KNEE;  Surgeon: Laurice Record Aplington;  Location: [place];  Service: Orthopedics;  Laterality: Left;  LEFT KNEE ARTHROSCOPY WITH PARTIAL MEDIAL  MENISECTOMY, Total lateral menisectomy, Shaving of Medial Femoral condyle  . leg surgery  46- age 48   Left , s/p traction, body  cast    Family History  Problem Relation Age of Onset  . Coronary artery disease Father        F, MI 13 y/o  . Stroke Mother        M d/t brain tumor   . Cancer Mother        M brain tumor  . Colon cancer Maternal Grandmother   . Diabetes Neg Hx   . Prostate cancer Neg Hx     Allergies  Allergen Reactions  . Contrast Media [Iodinated Diagnostic Agents] Hives  . Ioxaglate Hives  . Sulfa Antibiotics Hives  . Amaranth (Fd&C Red #2) Hives    Current Outpatient Medications on File Prior to Visit  Medication Sig Dispense Refill  . albuterol (PROVENTIL HFA;VENTOLIN HFA) 108 (90 Base) MCG/ACT inhaler Inhale 2 puffs into the lungs every 6 (six) hours as needed for wheezing or shortness of breath. 1 Inhaler 0  . azelastine (ASTELIN) 0.1 % nasal spray Place 2 sprays into both nostrils 2 (two) times daily. Use in each nostril as directed 30 mL 12  . hydrocortisone (ANUSOL-HC) 25 MG suppository Place 1 suppository (25 mg total) rectally 2 (two) times daily as needed for hemorrhoids. 12  suppository 1  . ondansetron (ZOFRAN) 4 MG tablet Take 1 tablet (4 mg total) by mouth every 8 (eight) hours as needed for nausea or vomiting. 20 tablet 0  . pantoprazole (PROTONIX) 40 MG tablet Take 1 tablet (40 mg total) by mouth daily before breakfast. Use as needed 90 tablet 3  . tamsulosin (FLOMAX) 0.4 MG CAPS capsule Take 1 capsule (0.4 mg total) by mouth daily after supper. 30 capsule 0   No current facility-administered medications on file prior to visit.     BP 116/72 (BP Location: Right Arm, Patient Position: Sitting, Cuff Size: Large)   Pulse 84   Temp 98.3 F (36.8 C) (Oral)   Resp 16   Ht 6\' 2"  (1.88 m)   Wt 245 lb (111.1 kg)   SpO2 94%   BMI 31.46 kg/m       Objective:   Physical Exam  Constitutional: He is oriented to person, place, and time. He appears well-developed and well-nourished. No distress.  HENT:  Head: Normocephalic and atraumatic.  Right Ear: Ear canal normal. Tympanic membrane is erythematous.  Left Ear: Tympanic membrane and ear canal normal.  Cardiovascular: Normal rate and regular rhythm.  No murmur heard. Pulmonary/Chest: Effort normal. No respiratory distress. He has wheezes. He has no rales.  Musculoskeletal: He exhibits no edema.  Neurological: He is alert and oriented to person, place, and time.  Skin: Skin is warm and dry.  Psychiatric: He has a normal mood and affect. His behavior is normal. Thought content normal.          Assessment & Plan:  Bronchitis with bronchospasm/early right otitis media- advised pt as follows:  Please begin zpak today along with prednisone taper. Continue albuterol 2 puffs every 6 hours as needed. Call if new/worsening symptoms or if you are not improved in 3-4 days.   Of note- he denies hx of amarynth or red dye allergy.

## 2017-07-11 NOTE — Progress Notes (Signed)
Chief Complaint:   Referring Provider:  Colon Branch, MD      ASSESSMENT AND PLAN;   #1. Rectal Bleeding (resolved). Nl Hb  #2. H/O Diverticulitis- 05/2017, colonoscopy 2006 by Dr. Sharlett Iles showed moderate sigmoid diverticulosis with segmental colitis. CT 05/2017 showed sigmoid diverticulitis with sigmoid wall thickening.  #3. IBS with diarrhea  #4.  Gastroesophageal reflux with erosive esophagitis on EGD 2006.  No Barrett's.  #5.  Gallbladder polyps-stable on serial ultrasounds.  - Proceed with colonoscopy in 4-6 weeks.  I have discussed the risks and benefits.  The risks including risk of perforation requiring laparotomy, bleeding after polypectomy requiring blood transfusions and risks of anesthesia/sedation were discussed.  Rare risks of missing colorectal neoplasms were also discussed.  Alternatives were given.  Patient is fully aware and agrees to proceed. All the questions were answered. Colonoscopy will be scheduled in upcoming days.  Patient is to report immediately if there is any significant weight loss or excessive bleeding until then. Consent forms were given for review. -If continues to have problems we will give him a trial of Bentyl. -Nonpharmacologic means of reflux control were stressed. -Continue Protonix 40 mg p.o. once a day for now. -Follow-up after the colonoscopy.    HPI:    Mario Jimenez is a 52 y.o. male  With long-standing history of GI complaints Had attack of diverticulitis in 2006 -colonoscopy by Dr. Sharlett Iles revealed sigmoid diverticulosis with segmental colitis. Patient has done well except for intermittent diarrhea especially when he is under stress. He also has lower abdominal crampy pain at times. Has some mucus in the stool as well. In May, he started having increasing abdominal pain associated with rectal bleeding diarrhea.  He underwent CT scan of the abdomen and pelvis which showed acute sigmoid diverticulitis with sigmoid wall thickening.   He was treated with antibiotics with good results. Sent to the GI clinic for further evaluation by means of colonoscopy.  No weight loss On review his labs did revealed mildly increased WBC count.  Normal hemoglobin.  CT report was reviewed.  Currently no fever chills.  He does feel better.  No significant abdominal pain or hematochezia.  Has history of reflux which is under good control on Protonix.  Past Medical History:  Diagnosis Date  . Allergy   . Asthma mild   no inhaler  . Gallbladder polyp 2018   29mm polyp  . GERD (gastroesophageal reflux disease) occasional   watches diet and takes diet  . Hemorrhoids    h/o Cscope   . History of MRSA infection few yrs ago while in hospital in [place]  . Meniscus tear 8-12   to have surgery-- left knee  . Urolithiasis 2007-2008   ([place]) left kidney  3 surgeries ; has passed stones since then     Past Surgical History:  Procedure Laterality Date  . CYSTOSCOPY/RETROGRADE/URETEROSCOPY/STONE EXTRACTION WITH BASKET  2007-- x2  . KNEE ARTHROSCOPY  12/05/2010   Procedure: ARTHROSCOPY KNEE;  Surgeon: Laurice Record Aplington;  Location: [facility];  Service: Orthopedics;  Laterality: Left;  LEFT KNEE ARTHROSCOPY WITH PARTIAL MEDIAL  MENISECTOMY, Total lateral menisectomy, Shaving of Medial Femoral condyle  . leg surgery  87- age 89   Left , s/p traction, body cast    Family History  Problem Relation Age of Onset  . Coronary artery disease Father        F, MI 79 y/o  . Stroke Mother  M d/t brain tumor   . Cancer Mother        M brain tumor  . Colon cancer Maternal Grandmother   . Diabetes Neg Hx   . Prostate cancer Neg Hx     Social History   Tobacco Use  . Smoking status: Current Every Day Smoker    Last attempt to quit: 09/19/2007    Years since quitting: 9.8  . Smokeless tobacco: Never Used  . Tobacco comment:  1 ppd  Substance Use Topics  . Alcohol use: Yes    Comment: social  . Drug use: No     Current Outpatient Medications  Medication Sig Dispense Refill  . albuterol (PROVENTIL HFA;VENTOLIN HFA) 108 (90 Base) MCG/ACT inhaler Inhale 2 puffs into the lungs every 6 (six) hours as needed for wheezing or shortness of breath. 1 Inhaler 0  . azelastine (ASTELIN) 0.1 % nasal spray Place 2 sprays into both nostrils 2 (two) times daily. Use in each nostril as directed 30 mL 12  . hydrocortisone (ANUSOL-HC) 25 MG suppository Place 1 suppository (25 mg total) rectally 2 (two) times daily as needed for hemorrhoids. 12 suppository 1  . ondansetron (ZOFRAN) 4 MG tablet Take 1 tablet (4 mg total) by mouth every 8 (eight) hours as needed for nausea or vomiting. 20 tablet 0  . pantoprazole (PROTONIX) 40 MG tablet Take 1 tablet (40 mg total) by mouth daily before breakfast. Use as needed 90 tablet 3  . tamsulosin (FLOMAX) 0.4 MG CAPS capsule Take 1 capsule (0.4 mg total) by mouth daily after supper. 30 capsule 0   No current facility-administered medications for this visit.     Allergies  Allergen Reactions  . Contrast Media [Iodinated Diagnostic Agents] Hives  . Ioxaglate Hives  . Sulfa Antibiotics Hives  . Amaranth (Fd&C Red #2) Hives    Review of Systems:  Constitutional: Denies fever, chills, diaphoresis, appetite change and fatigue.  HEENT: Denies photophobia, eye pain, redness, hearing loss, ear pain, congestion, sore throat, rhinorrhea, sneezing, mouth sores, neck pain, neck stiffness and tinnitus. Has allergies. Respiratory: Denies SOB, DOE, chest tightness,  and wheezing. Has cough. Cardiovascular: Denies chest pain, palpitations and leg swelling.  Genitourinary: Has kidney stones Musculoskeletal: Denies myalgias, back pain, joint swelling, arthralgias and gait problem.  Skin: No rash.  Neurological: Denies dizziness, seizures, syncope, weakness, light-headedness, numbness and headaches.  Hematological: Denies adenopathy. Easy bruising, personal or family bleeding history   Psychiatric/Behavioral: Has anxiety due to his job , nodepression     Physical Exam:    BP 130/66   Pulse 74   Ht 6\' 2"  (1.88 m)   Wt 245 lb (111.1 kg)   BMI 31.46 kg/m  Filed Weights   07/11/17 0845  Weight: 245 lb (111.1 kg)   Constitutional:  Well-developed, in no acute distress. Psychiatric: Normal mood and affect. Behavior is normal. HEENT: Pupils normal.  Conjunctivae are normal. No scleral icterus. Neck supple.  Cardiovascular: Normal rate, regular rhythm. No edema Pulmonary/chest: Effort normal and breath sounds normal. No wheezing, rales or rhonchi. Abdominal: Soft, nondistended. Nontender. Bowel sounds active throughout. There are no masses palpable. No hepatomegaly. Rectal:  defered Neurological: Alert and oriented to person place and time. Skin: Skin is warm and dry. No rashes noted.  Data Reviewed: I have personally reviewed following labs and imaging studies  CBC: CBC Latest Ref Rng & Units 06/24/2017 11/14/2016 03/20/2016  WBC 4.0 - 10.5 K/uL 12.8(H) 9.0 11.5(H)  Hemoglobin 13.0 - 17.0 g/dL 15.9 16.3  16.3  Hematocrit 39.0 - 52.0 % 47.5 49.1 48.6  Platelets 150.0 - 400.0 K/uL 210.0 171.0 206.0    CMP: CMP Latest Ref Rng & Units 06/24/2017 11/14/2016 03/20/2016  Glucose 70 - 99 mg/dL 86 102(H) 68(L)  BUN 6 - 23 mg/dL 10 14 13   Creatinine 0.40 - 1.50 mg/dL 1.08 0.93 1.29  Sodium 135 - 145 mEq/L 138 143 141  Potassium 3.5 - 5.1 mEq/L 4.0 4.6 4.3  Chloride 96 - 112 mEq/L 103 106 105  CO2 19 - 32 mEq/L 28 30 30   Calcium 8.4 - 10.5 mg/dL 9.1 9.4 9.7  Total Protein 6.0 - 8.3 g/dL - 7.1 7.0  Total Bilirubin 0.2 - 1.2 mg/dL - 0.6 0.5  Alkaline Phos 39 - 117 U/L - 69 69  AST 0 - 37 U/L - 16 20  ALT 0 - 53 U/L - 14 14   CT report from [facility] was reviewed-sigmoid wall thickening with diverticulitis.  Recommend colonoscopy electively.   Carmell Austria, MD 07/11/2017, 9:12 AM  Cc: Colon Branch, MD

## 2017-07-11 NOTE — Addendum Note (Signed)
Addended by: Karena Addison on: 07/11/2017 04:29 PM   Modules accepted: Orders

## 2017-07-11 NOTE — Patient Instructions (Signed)
If you are age 50 or older, your body mass index should be between 23-30. Your Body mass index is 31.46 kg/m. If this is out of the aforementioned range listed, please consider follow up with your Primary Care Provider.  If you are age 78 or younger, your body mass index should be between 19-25. Your Body mass index is 31.46 kg/m. If this is out of the aformentioned range listed, please consider follow up with your Primary Care Provider.   You have been scheduled for a colonoscopy. Please follow written instructions given to you at your visit today.  Please pick up your prep supplies at the pharmacy within the next 1-3 days. If you use inhalers (even only as needed), please bring them with you on the day of your procedure. Your physician has requested that you go to www.startemmi.com and enter the access code given to you at your visit today. This web site gives a general overview about your procedure. However, you should still follow specific instructions given to you by our office regarding your preparation for the procedure.  Thank you,  Dr. Jackquline Denmark

## 2017-07-11 NOTE — Patient Instructions (Signed)
Please begin zpak today along with prednisone taper. Continue albuterol 2 puffs every 6 hours as needed. Call if new/worsening symptoms or if you are not improved in 3-4 days.

## 2017-08-05 ENCOUNTER — Telehealth: Payer: Self-pay

## 2017-08-05 NOTE — Telephone Encounter (Signed)
Pt. apparently needs [facility] authorization prior to colonoscopy per referral note on 7/19 from [facility]. Routed to Dr. Larose Kells to advise.   Copied from [place] [phone]. Topic: Referral - Question >> Aug 02, 2017  1:28 PM Oliver Pila B wrote: Reason for CRM: pt called to go over different referrals that havent apparently been handled; contact pt to advise

## 2017-08-06 ENCOUNTER — Encounter: Payer: Self-pay | Admitting: Gastroenterology

## 2017-08-06 NOTE — Telephone Encounter (Signed)
Authorization # is in appointment notes and has been given to Dominica.

## 2017-08-14 ENCOUNTER — Ambulatory Visit (AMBULATORY_SURGERY_CENTER): Payer: 59 | Admitting: Gastroenterology

## 2017-08-14 ENCOUNTER — Encounter: Payer: Self-pay | Admitting: Gastroenterology

## 2017-08-14 VITALS — BP 107/61 | HR 69 | Temp 99.6°F | Resp 12 | Ht 74.0 in | Wt 245.0 lb

## 2017-08-14 DIAGNOSIS — D128 Benign neoplasm of rectum: Secondary | ICD-10-CM

## 2017-08-14 DIAGNOSIS — K621 Rectal polyp: Secondary | ICD-10-CM

## 2017-08-14 DIAGNOSIS — K625 Hemorrhage of anus and rectum: Secondary | ICD-10-CM

## 2017-08-14 DIAGNOSIS — D129 Benign neoplasm of anus and anal canal: Secondary | ICD-10-CM

## 2017-08-14 MED ORDER — SODIUM CHLORIDE 0.9 % IV SOLN: 500.0000 mL | Freq: Once | INTRAVENOUS | Status: DC

## 2017-08-14 MED ORDER — HYDROCORTISONE ACE-PRAMOXINE 1-1 % RE FOAM: 1.0000 | Freq: Two times a day (BID) | RECTAL | 4 refills | Status: DC

## 2017-08-14 NOTE — Progress Notes (Signed)
Called to room to assist during endoscopic procedure.  Patient ID and intended procedure confirmed with present staff. Received instructions for my participation in the procedure from the performing physician.  

## 2017-08-14 NOTE — Patient Instructions (Signed)
YOU HAD AN ENDOSCOPIC PROCEDURE TODAY AT [facility]:   Refer to the procedure report that was given to you for any specific questions about what was found during the examination.  If the procedure report does not answer your questions, please call your gastroenterologist to clarify.  If you requested that your care partner not be given the details of your procedure findings, then the procedure report has been included in a sealed envelope for you to review at your convenience later.  YOU SHOULD EXPECT: Some feelings of bloating in the abdomen. Passage of more gas than usual.  Walking can help get rid of the air that was put into your GI tract during the procedure and reduce the bloating. If you had a lower endoscopy (such as a colonoscopy or flexible sigmoidoscopy) you may notice spotting of blood in your stool or on the toilet paper. If you underwent a bowel prep for your procedure, you may not have a normal bowel movement for a few days.  Please Note:  You might notice some irritation and congestion in your nose or some drainage.  This is from the oxygen used during your procedure.  There is no need for concern and it should clear up in a day or so.  SYMPTOMS TO REPORT IMMEDIATELY:   Following lower endoscopy (colonoscopy or flexible sigmoidoscopy):  Excessive amounts of blood in the stool  Significant tenderness or worsening of abdominal pains  Swelling of the abdomen that is new, acute  Fever of 100F or higher   For urgent or emergent issues, a gastroenterologist can be reached at any hour by calling [phone].   DIET:  We do recommend a small meal at first, but then you may proceed to your regular diet.  Drink plenty of fluids but you should avoid alcoholic beverages for 24 hours.  ACTIVITY:  You should plan to take it easy for the rest of today and you should NOT DRIVE or use heavy machinery until tomorrow (because of the sedation medicines used during the test).     FOLLOW UP: Our staff will call the number listed on your records the next business day following your procedure to check on you and address any questions or concerns that you may have regarding the information given to you following your procedure. If we do not reach you, we will leave a message.  However, if you are feeling well and you are not experiencing any problems, there is no need to return our call.  We will assume that you have returned to your regular daily activities without incident.  If any biopsies were taken you will be contacted by phone or by letter within the next 1-3 weeks.  Please call us at [phone] if you have not heard about the biopsies in 3 weeks.    SIGNATURES/CONFIDENTIALITY: You and/or your care partner have signed paperwork which will be entered into your electronic medical record.  These signatures attest to the fact that that the information above on your After Visit Summary has been reviewed and is understood.  Full responsibility of the confidentiality of this discharge information lies with you and/or your care-partner.  Polyp and diverticulosis information given.  Proctofoam HC as directed.  High fiber diet given.

## 2017-08-14 NOTE — Progress Notes (Signed)
To PACU, VSS. Report to [email or web address] 

## 2017-08-14 NOTE — Op Note (Signed)
[facility] Patient Name: Mario Jimenez Procedure Date: 08/14/2017 9:57 AM MRN: [id number] Endoscopist: Jackquline Denmark , MD Age: 53 Referring MD:  Date of Birth: 02-20-1965 Gender: Male Account #: [id number] Procedure:                Colonoscopy Indications:              Rectal bleeding, History of diverticulitis. Medicines:                Monitored Anesthesia Care Procedure:                Pre-Anesthesia Assessment:                           - Prior to the procedure, a History and Physical                            was performed, and patient medications and                            allergies were reviewed. The patient's tolerance of                            previous anesthesia was also reviewed. The risks                            and benefits of the procedure and the sedation                            options and risks were discussed with the patient.                            All questions were answered, and informed consent                            was obtained. Prior Anticoagulants: The patient has                            taken no previous anticoagulant or antiplatelet                            agents. ASA Grade Assessment: II - A patient with                            mild systemic disease. After reviewing the risks                            and benefits, the patient was deemed in                            satisfactory condition to undergo the procedure.                           After obtaining informed consent, the colonoscope  was passed with some difficulty through the sigmoid                            colon, thereafter with theseunder direct vision.                            Throughout the procedure, the patient's blood                            pressure, pulse, and oxygen saturations were                            monitored continuously. The Model CF-HQ190L                            [id number]) scope was introduced  through the anus                            and advanced to the the cecum, identified by                            appendiceal orifice and ileocecal valve. The                            patient tolerated the procedure well. The quality                            of the bowel preparation was good. Scope In: 10:03:53 AM Scope Out: 10:28:29 AM Scope Withdrawal Time: 0 hours 14 minutes 58 seconds  Total Procedure Duration: 0 hours 24 minutes 36 seconds  Findings:                 External hemorrhoids and prolapsed internal                            hemorrhoids were found on perianal exam.                           A 4 mm polyp was found in the rectum. The polyp was                            sessile. The polyp was removed with a cold biopsy                            forceps. Resection and retrieval were complete.                           Multiple small and large-mouthed diverticula were                            found in the sigmoid colon and few in descending                            colon. Biopsies were taken with a  cold forceps for                            histology. Estimated blood loss: none.                           Non-bleeding hemorrhoids were found during                            retroflexion. The hemorrhoids were moderate.                           An area of mildly congested mucosa was found in the                            sigmoid colon. Biopsies were taken with a cold                            forceps for histology. Estimated blood loss was                            minimal.                           The exam was otherwise without abnormality on                            direct and retroflexion views. Complications:            No immediate complications. Estimated Blood Loss:     Estimated blood loss: none. Impression:               - Rectal polyp status post polypectomy.                           - Moderate sigmoid diverticulosis with mild                             associated colitis likely SCAD ( biopsed)                           - Internal and external hemorrhoids (Grade IV). Recommendation:           - Patient has a contact number available for                            emergencies. The signs and symptoms of potential                            delayed complications were discussed with the                            patient. Return to normal activities tomorrow.                            Written discharge instructions were provided to the  patient.                           - High fiber diet.                           - Continue present medications.                           - Await pathology results.                           - Repeat colonoscopy for surveillance based on                            pathology results.                           - ProctoFoam-HC: Apply externally BID for 2 weeks.                            If still with problems, would consider surgical                            evaluation for possible hemorrhoidectomy. Jackquline Denmark, MD 08/14/2017 10:45:17 AM This report has been signed electronically.

## 2017-08-15 ENCOUNTER — Telehealth: Payer: Self-pay

## 2017-08-15 ENCOUNTER — Telehealth: Payer: Self-pay | Admitting: *Deleted

## 2017-08-15 NOTE — Telephone Encounter (Signed)
  Follow up Call-  Call back number 08/14/2017  Post procedure Call Back phone  # [phone]  Permission to leave phone message Yes  Some recent data might be hidden     Patient questions:  Do you have a fever, pain , or abdominal swelling? No. Pain Score  0 *  Have you tolerated food without any problems? Yes.    Have you been able to return to your normal activities? Yes.    Do you have any questions about your discharge instructions: Diet   No. Medications  No. Follow up visit  No.  Do you have questions or concerns about your Care? No.  Actions: * If pain score is 4 or above: No action needed, pain <4.

## 2017-08-15 NOTE — Telephone Encounter (Signed)
  Follow up Call-  Call back number 08/14/2017  Post procedure Call Back phone  # [phone]  Permission to leave phone message Yes  Some recent data might be hidden     No answer at # given.  LM on VM.

## 2017-09-01 ENCOUNTER — Encounter: Payer: Self-pay | Admitting: Gastroenterology

## 2017-11-19 ENCOUNTER — Encounter: Payer: 59 | Admitting: Internal Medicine

## 2018-08-19 ENCOUNTER — Encounter: Payer: Self-pay | Admitting: Internal Medicine

## 2018-10-20 ENCOUNTER — Other Ambulatory Visit: Payer: Self-pay

## 2018-10-21 ENCOUNTER — Other Ambulatory Visit: Payer: Self-pay

## 2018-10-21 ENCOUNTER — Encounter: Payer: Self-pay | Admitting: Internal Medicine

## 2018-10-21 ENCOUNTER — Ambulatory Visit (INDEPENDENT_AMBULATORY_CARE_PROVIDER_SITE_OTHER): Payer: Self-pay | Admitting: Internal Medicine

## 2018-10-21 VITALS — BP 117/72 | HR 84 | Temp 97.8°F | Resp 16 | Ht 74.0 in | Wt 261.2 lb

## 2018-10-21 DIAGNOSIS — Z0000 Encounter for general adult medical examination without abnormal findings: Secondary | ICD-10-CM

## 2018-10-21 MED ORDER — PANTOPRAZOLE SODIUM 40 MG PO TBEC: 40.0000 mg | DELAYED_RELEASE_TABLET | Freq: Every day | ORAL | 3 refills | Status: DC

## 2018-10-21 NOTE — Progress Notes (Signed)
Subjective:    Patient ID: Mario Jimenez, male    DOB: February 08, 1965, 53 y.o.   MRN: [id number]  DOS:  10/21/2018 Type of visit - description: Here for CPX History of large hemorrhoids, from time to time he sees fresh blood per rectum after bowel movement.  Symptoms are not persistent Also from time to time have left-sided mild abdominal discomfort but denies nausea, vomiting. Hardly ever has heartburn  Review of Systems  Other than above, a 14 point review of systems is negative    Past Medical History:  Diagnosis Date  . Allergy   . Asthma mild   no inhaler  . Gallbladder polyp 2018   69mm polyp  . GERD (gastroesophageal reflux disease) occasional   watches diet and takes diet  . Hemorrhoids    h/o Cscope   . History of MRSA infection few yrs ago while in hospital in [place]  . Meniscus tear 8-12   to have surgery-- left knee  . Urolithiasis 2007-2008   ([place]) left kidney  3 surgeries ; has passed stones since then     Past Surgical History:  Procedure Laterality Date  . CYSTOSCOPY/RETROGRADE/URETEROSCOPY/STONE EXTRACTION WITH BASKET  2007-- x2  . KNEE ARTHROSCOPY  12/05/2010   Procedure: ARTHROSCOPY KNEE;  Surgeon: Laurice Record Aplington;  Location: [facility];  Service: Orthopedics;  Laterality: Left;  LEFT KNEE ARTHROSCOPY WITH PARTIAL MEDIAL  MENISECTOMY, Total lateral menisectomy, Shaving of Medial Femoral condyle  . leg surgery  69- age 37   Left , s/p traction, body cast   Family History  Problem Relation Age of Onset  . Coronary artery disease Father        F, MI 51 y/o  . Stroke Mother        M d/t brain tumor   . Cancer Mother        M brain tumor  . Colon cancer Maternal Grandmother   . Diabetes Neg Hx   . Prostate cancer Neg Hx   . Esophageal cancer Neg Hx     Social History   Socioeconomic History  . Marital status: Single    Spouse name: Not on file  . Number of children: 0  . Years of education: Not on file  . Highest education  level: Not on file  Occupational History  . Occupation: out of work  Social Needs  . Financial resource strain: Not on file  . Food insecurity    Worry: Not on file    Inability: Not on file  . Transportation needs    Medical: Not on file    Non-medical: Not on file  Tobacco Use  . Smoking status: Former Smoker    Quit date: 08/2018    Years since quitting: 0.1  . Smokeless tobacco: Never Used  Substance and Sexual Activity  . Alcohol use: Yes    Comment: social  . Drug use: No  . Sexual activity: Not on file  Lifestyle  . Physical activity    Days per week: Not on file    Minutes per session: Not on file  . Stress: Not on file  Relationships  . Social Herbalist on phone: Not on file    Gets together: Not on file    Attends religious service: Not on file    Active member of club or organization: Not on file    Attends meetings of clubs or organizations: Not on file    Relationship status: Not  on file  . Intimate partner violence    Fear of current or ex partner: Not on file    Emotionally abused: Not on file    Physically abused: Not on file    Forced sexual activity: Not on file  Other Topics Concern  . Not on file  Social History Narrative   From [place], used to live in [place] x a while, get back to [facility] 2009   lives by himself                   Allergies as of 10/21/2018      Reactions   Contrast Media [iodinated Diagnostic Agents] Hives   Ioxaglate Hives   Sulfa Antibiotics Hives      Medication List       Accurate as of October 21, 2018 11:59 PM. If you have any questions, ask your nurse or doctor.        STOP taking these medications   azelastine 0.1 % nasal spray Commonly known as: ASTELIN Stopped by: Kathlene November, MD   azithromycin 250 MG tablet Commonly known as: ZITHROMAX Stopped by: Kathlene November, MD   hydrocortisone 25 MG suppository Commonly known as: ANUSOL-HC Stopped by: Kathlene November, MD   hydrocortisone-pramoxine rectal foam  Commonly known as: Proctofoam HC Stopped by: Kathlene November, MD   ondansetron 4 MG tablet Commonly known as: Zofran Stopped by: Kathlene November, MD   predniSONE 10 MG tablet Commonly known as: DELTASONE Stopped by: Kathlene November, MD   tamsulosin 0.4 MG Caps capsule Commonly known as: FLOMAX Stopped by: Kathlene November, MD     TAKE these medications   albuterol 108 (90 Base) MCG/ACT inhaler Commonly known as: VENTOLIN HFA Inhale 2 puffs into the lungs every 6 (six) hours as needed for wheezing or shortness of breath.   pantoprazole 40 MG tablet Commonly known as: PROTONIX Take 1 tablet (40 mg total) by mouth daily before breakfast. Use as needed           Objective:   Physical Exam BP 117/72 (BP Location: Left Arm, Patient Position: Sitting, Cuff Size: Normal)   Pulse 84   Temp 97.8 F (36.6 C) (Temporal)   Resp 16   Ht 6\' 2"  (1.88 m)   Wt 261 lb 4 oz (118.5 kg)   SpO2 94%   BMI 33.54 kg/m   General: Well developed, NAD, BMI noted Neck: No  thyromegaly  HEENT:  Normocephalic . Face symmetric, atraumatic Lungs:  CTA B Normal respiratory effort, no intercostal retractions, no accessory muscle use. Heart: RRR,  no murmur.  No pretibial edema bilaterally  Abdomen:  Not distended, soft, non-tender. No rebound or rigidity.   Skin: Exposed areas without rash. Not pale. Not jaundice DRE: Very large external hemorrhoids, prostate is normal, rectum without mass.  No stools found. Neurologic:  alert & oriented X3.  Speech normal, gait appropriate for age and unassisted Strength symmetric and appropriate for age.  Psych: Cognition and judgment appear intact.  Cooperative with normal attention span and concentration.  Behavior appropriate. No anxious or depressed appearing.     Assessment      Assessment Asthma GERD Chronic sinusitis, Allergies Urolithiasis History of diverticulitis GB polyp, saw surgery/2018, rec a [place] in 6 months. Surgery if polyp >  10 mm Smoker: quit 08/2018   PLAN: Here for CPX Asthma: Not an issue recently GERD: Very seldom has symptoms, request a Protonix just in case.  Prescription sent. Gallbladder polyp: Was recommended routine ultrasounds,  no insurance, has lost follow-up.  Nevertheless I offer him a ultrasound.  Patient declined it.  Risk of malignancy discussed.  Will call when ready. Urolithiasis: Currently with no symptoms Large hemorrhoids: Symptoms are episodic. RTC 1 year

## 2018-10-21 NOTE — Patient Instructions (Signed)
GO TO THE LAB : Get the blood work     GO TO THE FRONT DESK Schedule your next appointment for physical in 1 year.

## 2018-10-21 NOTE — Assessment & Plan Note (Addendum)
-  Td 09-2011; declined flu shot -[id number] 2006 @ [facility] (d/t diverticulitis?); Cscope 07-2016: Rectal polyp, internal and external hemorrhoids grade 4.  Next Cscope per GI -prostate ca screening: DRE: Normal prostate, check a PSA -Tobacco abuse:quit, praised! -Labs: CMP, FLP, CBC, PSA (fasting)

## 2018-10-21 NOTE — Progress Notes (Signed)
Pre visit review using our clinic review tool, if applicable. No additional management support is needed unless otherwise documented below in the visit note. 

## 2018-10-22 LAB — LIPID PANEL
Cholesterol: 127 mg/dL (ref 0–200)
HDL: 31 mg/dL — ABNORMAL LOW (ref >39.00)
LDL Cholesterol: 65 mg/dL (ref 0–99)
NonHDL: 96.01
Total CHOL/HDL Ratio: 4
Triglycerides: 157 mg/dL — ABNORMAL HIGH (ref 0.0–149.0)
VLDL: 31.4 mg/dL (ref 0.0–40.0)

## 2018-10-22 LAB — COMPREHENSIVE METABOLIC PANEL
ALT: 15 U/L (ref 0–53)
AST: 15 U/L (ref 0–37)
Albumin: 4.3 g/dL (ref 3.5–5.2)
Alkaline Phosphatase: 78 U/L (ref 39–117)
BUN: 12 mg/dL (ref 6–23)
CO2: 30 mEq/L (ref 19–32)
Calcium: 9.4 mg/dL (ref 8.4–10.5)
Chloride: 103 mEq/L (ref 96–112)
Creatinine, Ser: 1.01 mg/dL (ref 0.40–1.50)
GFR: 77.87 mL/min (ref >60.00)
Glucose, Bld: 88 mg/dL (ref 70–99)
Potassium: 4.6 mEq/L (ref 3.5–5.1)
Sodium: 139 mEq/L (ref 135–145)
Total Bilirubin: 0.4 mg/dL (ref 0.2–1.2)
Total Protein: 7 g/dL (ref 6.0–8.3)

## 2018-10-22 LAB — CBC WITH DIFFERENTIAL/PLATELET
Basophils Absolute: 0.1 10*3/uL (ref 0.0–0.1)
Basophils Relative: 0.6 % (ref 0.0–3.0)
Eosinophils Absolute: 0.2 10*3/uL (ref 0.0–0.7)
Eosinophils Relative: 2 % (ref 0.0–5.0)
HCT: 48.5 % (ref 39.0–52.0)
Hemoglobin: 16.3 g/dL (ref 13.0–17.0)
Lymphocytes Relative: 28.3 % (ref 12.0–46.0)
Lymphs Abs: 2.8 10*3/uL (ref 0.7–4.0)
MCHC: 33.6 g/dL (ref 30.0–36.0)
MCV: 90.6 fl (ref 78.0–100.0)
Monocytes Absolute: 0.6 10*3/uL (ref 0.1–1.0)
Monocytes Relative: 5.9 % (ref 3.0–12.0)
Neutro Abs: 6.2 10*3/uL (ref 1.4–7.7)
Neutrophils Relative %: 63.2 % (ref 43.0–77.0)
Platelets: 187 10*3/uL (ref 150.0–400.0)
RBC: 5.36 Mil/uL (ref 4.22–5.81)
RDW: 13.6 % (ref 11.5–15.5)
WBC: 9.8 10*3/uL (ref 4.0–10.5)

## 2018-10-22 LAB — PSA: PSA: 1.06 ng/mL (ref 0.10–4.00)

## 2018-10-23 NOTE — Assessment & Plan Note (Signed)
Here for CPX Asthma: Not an issue recently GERD: Very seldom has symptoms, request a Protonix just in case.  Prescription sent. Gallbladder polyp: Was recommended routine ultrasounds, no insurance, has lost follow-up.  Nevertheless I offer him a ultrasound.  Patient declined it.  Risk of malignancy discussed.  Will call when ready. Urolithiasis: Currently with no symptoms Large hemorrhoids: Symptoms are episodic. RTC 1 year

## 2019-06-09 ENCOUNTER — Ambulatory Visit (INDEPENDENT_AMBULATORY_CARE_PROVIDER_SITE_OTHER): Payer: Self-pay | Admitting: Internal Medicine

## 2019-06-09 ENCOUNTER — Other Ambulatory Visit: Payer: Self-pay

## 2019-06-09 ENCOUNTER — Encounter: Payer: Self-pay | Admitting: Internal Medicine

## 2019-06-09 VITALS — BP 127/79 | HR 97 | Temp 97.3°F | Resp 18 | Ht 74.0 in | Wt 275.2 lb

## 2019-06-09 DIAGNOSIS — G4700 Insomnia, unspecified: Secondary | ICD-10-CM

## 2019-06-09 DIAGNOSIS — Z0189 Encounter for other specified special examinations: Secondary | ICD-10-CM

## 2019-06-09 DIAGNOSIS — R079 Chest pain, unspecified: Secondary | ICD-10-CM

## 2019-06-09 DIAGNOSIS — R0683 Snoring: Secondary | ICD-10-CM

## 2019-06-09 NOTE — Progress Notes (Signed)
Subjective:    Patient ID: Mario Jimenez, male    DOB: 25-Dec-1965, 54 y.o.   MRN: [id number]  DOS:  06/09/2019 Type of visit - description: Acute  5 weeks ago, he moved from his own house to live with a friend, his room is comfortable. Since then however he has been unable to sleep. Wakes up every 30 minutes, by 3 AM he is wide-awake. Admits to snoring.  He is very sleepy throughout the day particularly when he drives.  He denies sore throat. Admits to nasal congestion which is chronic. When asked admits to occasional headache and sometimes chest pain. Chest pain description is vague, happens sometimes, typically at rest, is bilateral at the upper chest and sometimes radiates to both arms. No associated nausea or sweats. No exertional symptoms. I ask about the stress, he is very busy at work but he thinks he is handling very good. BP has been slightly elevated lately up to 143/93.   Wt Readings from Last 3 Encounters:  06/09/19 275 lb 4 oz (124.9 kg)  10/21/18 261 lb 4 oz (118.5 kg)  08/14/17 245 lb (111.1 kg)   BP Readings from Last 3 Encounters:  06/09/19 127/79  10/21/18 117/72  08/14/17 107/61     Review of Systems See above   Past Medical History:  Diagnosis Date  . Allergy   . Asthma mild   no inhaler  . Gallbladder polyp 2018   2mm polyp  . GERD (gastroesophageal reflux disease) occasional   watches diet and takes diet  . Hemorrhoids    h/o Cscope   . History of MRSA infection few yrs ago while in hospital in [place]  . Meniscus tear 8-12   to have surgery-- left knee  . Urolithiasis 2007-2008   ([place]) left kidney  3 surgeries ; has passed stones since then     Past Surgical History:  Procedure Laterality Date  . CYSTOSCOPY/RETROGRADE/URETEROSCOPY/STONE EXTRACTION WITH BASKET  2007-- x2  . KNEE ARTHROSCOPY  12/05/2010   Procedure: ARTHROSCOPY KNEE;  Surgeon: Laurice Record Aplington;  Location: [facility];  Service: Orthopedics;   Laterality: Left;  LEFT KNEE ARTHROSCOPY WITH PARTIAL MEDIAL  MENISECTOMY, Total lateral menisectomy, Shaving of Medial Femoral condyle  . leg surgery  1977- age 45   Left , s/p traction, body cast    Allergies as of 06/09/2019      Reactions   Contrast Media [iodinated Diagnostic Agents] Hives   Ioxaglate Hives   Sulfa Antibiotics Hives      Medication List       Accurate as of Jun 09, 2019 11:33 AM. If you have any questions, ask your nurse or doctor.        albuterol 108 (90 Base) MCG/ACT inhaler Commonly known as: VENTOLIN HFA Inhale 2 puffs into the lungs every 6 (six) hours as needed for wheezing or shortness of breath.   pantoprazole 40 MG tablet Commonly known as: PROTONIX Take 1 tablet (40 mg total) by mouth daily before breakfast. Use as needed          Objective:   Physical Exam BP 127/79 (BP Location: Left Arm, Patient Position: Sitting, Cuff Size: Normal)   Pulse 97   Temp (!) 97.3 F (36.3 C) (Temporal)   Resp 18   Ht 6\' 2"  (1.88 m)   Wt 275 lb 4 oz (124.9 kg)   SpO2 100%   BMI 35.34 kg/m  General:   Well developed, NAD, BMI noted. HEENT:  Normocephalic . Face symmetric, atraumatic. Voice is nasal, nose is slightly congested. Throat: Symmetric, not red, crowded. Lungs:  CTA B Normal respiratory effort, no intercostal retractions, no accessory muscle use. Heart: RRR,  no murmur.  Lower extremities: no pretibial edema bilaterally  Skin: Not pale. Not jaundice Neurologic:  alert & oriented X3.  Speech normal, gait appropriate for age and unassisted Psych--  Cognition and judgment appear intact.  Cooperative with normal attention span and concentration.  Behavior appropriate. No anxious or depressed appearing.      Assessment     Assessment Asthma GERD Chronic sinusitis, Allergies Urolithiasis History of diverticulitis GB polyp, saw surgery/2018, rec a [place] in 6 months. Surgery if polyp >  10 mm Smoker: quit 08/2018 Chronic sinusitis:  Seen by ENT 2018, Dr Laurance Flatten, rx medical treatment, no polyps  PLAN: Insomnia, snoring: Symptoms as described above, he has very poor sleep pattern, he snores and he is extremely sleepy throughout the day.  Epworth scale is 16. Symptoms started only 5 weeks ago, possibly triggered by weight gain over the last year. Tylenol PM helped him last night to get few hours of sleep. Plan: Refer to cardiology for further sleep apnea evaluation. Strongly recommend not to drive if drowsy. Okay to take Tylenol PM as a temporary measure so he can get some sleep Chest pain: Atypical, EKG today: NSR, no acute changes compared to previous EKG.  Elevated BP at home: Ambulatory BPs are normal with occasional reading of 143/93.  Recommend to continue checking.    This visit occurred during the SARS-CoV-2 public health emergency.  Safety protocols were in place, including screening questions prior to the visit, additional usage of staff PPE, and extensive cleaning of exam room while observing appropriate contact time as indicated for disinfecting solutions.

## 2019-06-09 NOTE — Patient Instructions (Addendum)
COVID-19 Vaccine Information can be found at: [email or web address].[email or web address] For questions related to vaccine distribution or appointments, please email [email or web address] or call [phone].    Continue taking Tylenol PM before bedtime to help with sleep  Do not drive if you are drowsy.  We are referring you to cardiology

## 2019-06-09 NOTE — Progress Notes (Signed)
Pre visit review using our clinic review tool, if applicable. No additional management support is needed unless otherwise documented below in the visit note. 

## 2019-06-10 NOTE — Assessment & Plan Note (Signed)
Insomnia, snoring: Symptoms as described above, he has very poor sleep pattern, he snores and he is extremely sleepy throughout the day.  Epworth scale is 16. Symptoms started only 5 weeks ago, possibly triggered by weight gain over the last year. Tylenol PM helped him last night to get few hours of sleep. Plan: Refer to cardiology for further sleep apnea evaluation. Strongly recommend not to drive if drowsy. Okay to take Tylenol PM as a temporary measure so he can get some sleep Chest pain: Atypical, EKG today: NSR, no acute changes compared to previous EKG.  Elevated BP at home: Ambulatory BPs are normal with occasional reading of 143/93.  Recommend to continue checking.

## 2019-06-12 ENCOUNTER — Encounter: Payer: Self-pay | Admitting: Cardiovascular Disease

## 2019-06-12 ENCOUNTER — Other Ambulatory Visit: Payer: Self-pay

## 2019-06-12 ENCOUNTER — Ambulatory Visit (INDEPENDENT_AMBULATORY_CARE_PROVIDER_SITE_OTHER): Payer: Self-pay | Admitting: Cardiovascular Disease

## 2019-06-12 VITALS — BP 127/76 | HR 84 | Ht 73.0 in | Wt 280.0 lb

## 2019-06-12 DIAGNOSIS — M25473 Effusion, unspecified ankle: Secondary | ICD-10-CM

## 2019-06-12 DIAGNOSIS — E668 Other obesity: Secondary | ICD-10-CM

## 2019-06-12 DIAGNOSIS — R0683 Snoring: Secondary | ICD-10-CM

## 2019-06-12 DIAGNOSIS — K219 Gastro-esophageal reflux disease without esophagitis: Secondary | ICD-10-CM

## 2019-06-12 DIAGNOSIS — G4719 Other hypersomnia: Secondary | ICD-10-CM

## 2019-06-12 DIAGNOSIS — I10 Essential (primary) hypertension: Secondary | ICD-10-CM

## 2019-06-12 MED ORDER — AMLODIPINE BESYLATE 5 MG PO TABS: 5.0000 mg | ORAL_TABLET | Freq: Every day | ORAL | 3 refills | Status: DC

## 2019-06-12 NOTE — Progress Notes (Signed)
Cardiology Office Note    Date:  06/18/2019   ID:  DMONI Mario Jimenez, DOB 22-Jun-1965, MRN [id number]  PCP:  Colon Branch, MD  Cardiologist:  Shelva Majestic, MD   New sleep evaluation, referred through the courtesy of Dr. Belinda Fisher  History of Present Illness:  Mario Jimenez is a 54 y.o. male who recently was evaluated Dr. Belinda Fisher for primary care.  Due to complaints of significant difficulty with sleep, with significant excessive daytime sleepiness and snoring, he is referred for a new sleep evaluation.  Mr. Derosia has a longstanding history of tobacco use.  He started smoking at age 33 and quit at age 39.  He resumed smoking at age 88 and quit again at age 83.  He had recently moved from [facility] to [employer] and has a new job as a Tour manager.  He previously had been unemployed for a year.  Upon new moving to his to live with a friend's month he admits to very poor sleep.  He does snore.  He has frequent awakenings.  His sleep is nonrestorative.  He wakes up gasping for breath and has nocturia at least 2-3 times per night.  He has significant daytime sleepiness.  Epworth Sleepiness Scale score was calculated in the office today and this endorsed at 14 consistent with significant daytime sleepiness as shown below:  Epworth Sleepiness Scale: Situation   Chance of Dozing/Sleeping (0 = never , 1 = slight chance , 2 = moderate chance , 3 = high chance )   sitting and reading 1   watching TV 2   sitting inactive in a public place 2   being a passenger in a motor vehicle for an hour or more 3   lying down in the afternoon 3   sitting and talking to someone 0   sitting quietly after lunch (no alcohol) 2   while stopped for a few minutes in traffic as the driver 1   Total Score  14   At times he is aware of frequent leg movement with sleep but denies  painful restless legs, bruxism, hypnagogic hallucinations or cataplectic events.  He typically goes to bed at 9 PM and  wakes up around 5 AM.  History is also notable for mild leg swelling, kidney stones, history of GERD with remote esophagitis, history of diverticulitis, as well as gallbladder polyps   Past Medical History:  Diagnosis Date  . Allergy   . Asthma mild   no inhaler  . Gallbladder polyp 2018   69m polyp  . GERD (gastroesophageal reflux disease) occasional   watches diet and takes diet  . Hemorrhoids    h/o Cscope   . [place] . Meniscus tear 8-12   to have surgery-- left knee  . Urolithiasis 2007-2008   [place] kidney  3 surgeries ; has passed stones since then     Past Surgical History:  Procedure Laterality Date  . CYSTOSCOPY/RETROGRADE/URETEROSCOPY/STONE EXTRACTION WITH BASKET  2007-- x2  . KNEE ARTHROSCOPY  12/05/2010   Procedure: ARTHROSCOPY KNEE;  Surgeon: JLaurice RecordAplington;  [place]  Service: Orthopedics;  Laterality: Left;  LEFT KNEE ARTHROSCOPY WITH PARTIAL MEDIAL  MENISECTOMY, Total lateral menisectomy, Shaving of Medial Femoral condyle  . leg surgery  1977- age 54  Left , s/p traction, body cast    Current Medications: Outpatient Medications Prior to Visit  Medication Sig Dispense Refill  . pantoprazole (PROTONIX) 40 MG tablet Take 1 tablet (40 mg total) by mouth daily before breakfast. Use as needed 90 tablet 3  . albuterol (PROVENTIL HFA;VENTOLIN HFA) 108 (90 Base) MCG/ACT inhaler Inhale 2 puffs into the lungs every 6 (six) hours as needed for wheezing or shortness of breath. (Patient not taking: Reported on 08/14/2017) 1 Inhaler 0   No facility-administered medications prior to visit.     Allergies:   Contrast media [iodinated diagnostic agents], Ioxaglate, and Sulfa antibiotics   Social History   Socioeconomic History  . Marital status: Single    Spouse name: Not on file  . Number of children: 0  . Years of education: Not on file  . Highest education level: Not on  file  Occupational History  . Occupation: out of work  Tobacco Use  . Smoking status: Former Smoker    Quit date: 08/2018    Years since quitting: 0.8  . Smokeless tobacco: Never Used  Substance and Sexual Activity  . Alcohol use: Yes    Comment: social  . Drug use: No  . Sexual activity: Not on file  Other Topics Concern  . Not on file  Social History Narrative   From [place], used to live in [place] x a while, get back to [facility] 2009   lives by himself                Social Determinants of Health   Financial Resource Strain:   . Difficulty of Paying Living Expenses:   Food Insecurity:   . Worried About Charity fundraiser in the Last Year:   . Arboriculturist in the Last Year:   Transportation Needs:   . Film/video editor (Medical):   Marland Kitchen Lack of Transportation (Non-Medical):   Physical Activity:   . Days of Exercise per Week:   . Minutes of Exercise per Session:   Stress:   . Feeling of Stress :   Social Connections:   . Frequency of Communication with Friends and Family:   . Frequency of Social Gatherings with Friends and Family:   . Attends Religious Services:   . Active Member of Clubs or Organizations:   . Attends Archivist Meetings:   Marland Kitchen Marital Status:     Social history is notable in that he is single and never married.  He does not have children.  After not being employed for the past year, he commenced working again on May 04, 2019.  He has to wait 90 days before he has insurance through work and this will be [employer].  Family History:  The patient's family history includes Cancer in his mother; Colon cancer in his maternal grandmother; Coronary artery disease in his father; Stroke in his mother.  His mother died at age 14 with a brain tumor.  Father is living at age 17.  He has had history of an MI.  He has a brother age 65 and a sister age 59.  ROS General: Negative; No fevers, chills, or night sweats;  HEENT: Negative; No changes in  vision or hearing, sinus congestion, difficulty swallowing Pulmonary: Negative; No cough, wheezing, shortness of breath, hemoptysis Cardiovascular: Negative; No chest pain, presyncope, syncope, palpitations Mild leg swelling GI: History of diverticulitis and diverticulosis; IBS with diarrhea; history of GERD with erosive esophagitis in 2006; history of gallbladder polyps GU: Positive for kidney stones Musculoskeletal: Negative; no myalgias, joint pain, or weakness Hematologic/Oncology: Negative; no  easy bruising, bleeding Endocrine: Negative; no heat/cold intolerance; no diabetes Neuro: Negative; no changes in balance, headaches Skin: Negative; No rashes or skin lesions Psychiatric: Negative; No behavioral problems, depression Sleep: See HPI;  other comprehensive 14 point system review is negative.   PHYSICAL EXAM:   VS:  BP 127/76   Pulse 84   Ht '6\' 1"'  (1.854 m)   Wt 280 lb (127 kg)   SpO2 98%   BMI 36.94 kg/m     Blood pressure by me was 135/75  Wt Readings from Last 3 Encounters:  06/12/19 280 lb (127 kg)  06/09/19 275 lb 4 oz (124.9 kg)  10/21/18 261 lb 4 oz (118.5 kg)    General: Alert, oriented, no distress.  Skin: normal turgor, no rashes, warm and dry HEENT: Normocephalic, atraumatic. Pupils equal round and reactive to light; sclera anicteric; extraocular muscles intact;  Nose without nasal septal hypertrophy Mouth/Parynx benign; Mallinpatti scale 4 Neck: No JVD, no carotid bruits; normal carotid upstroke Lungs: clear to ausculatation and percussion; no wheezing or rales Chest wall: without tenderness to palpitation Heart: PMI not displaced, RRR, s1 s2 normal, 1/6 systolic murmur, no diastolic murmur, no rubs, gallops, thrills, or heaves Abdomen: Mild central adiposity; soft, nontender; no hepatosplenomehaly, BS+; abdominal aorta nontender and not dilated by palpation. Back: no CVA tenderness Pulses 2+ Musculoskeletal: full range of motion, normal strength, no  joint deformities Extremities: no clubbing cyanosis or edema, Homan's sign negative  Neurologic: grossly nonfocal; Cranial nerves grossly wnl Psychologic: Normal mood and affect   Studies/Labs Reviewed:   EKG:  EKG is  ordered today.  ECG (independently read by me): NSR at 89; NO significant STT changes  Recent Labs: BMP Latest Ref Rng & Units 10/21/2018 06/24/2017 11/14/2016  Glucose 70 - 99 mg/dL 88 86 102(H)  BUN 6 - 23 mg/dL '12 10 14  ' Creatinine 0.40 - 1.50 mg/dL 1.01 1.08 0.93  Sodium 135 - 145 mEq/L 139 138 143  Potassium 3.5 - 5.1 mEq/L 4.6 4.0 4.6  Chloride 96 - 112 mEq/L 103 103 106  CO2 19 - 32 mEq/L '30 28 30  ' Calcium 8.4 - 10.5 mg/dL 9.4 9.1 9.4     Hepatic Function Latest Ref Rng & Units 10/21/2018 11/14/2016 03/20/2016  Total Protein 6.0 - 8.3 g/dL 7.0 7.1 7.0  Albumin 3.5 - 5.2 g/dL 4.3 4.2 4.3  AST 0 - 37 U/L '15 16 20  ' ALT 0 - 53 U/L '15 14 14  ' Alk Phosphatase 39 - 117 U/L 78 69 69  Total Bilirubin 0.2 - 1.2 mg/dL 0.4 0.6 0.5    CBC Latest Ref Rng & Units 10/21/2018 06/24/2017 11/14/2016  WBC 4.0 - 10.5 K/uL 9.8 12.8(H) 9.0  Hemoglobin 13.0 - 17.0 g/dL 16.3 15.9 16.3  Hematocrit 39.0 - 52.0 % 48.5 47.5 49.1  Platelets 150.0 - 400.0 K/uL 187.0 210.0 171.0   Lab Results  Component Value Date   MCV 90.6 10/21/2018   MCV 90.0 06/24/2017   MCV 91.5 11/14/2016   Lab Results  Component Value Date   TSH 1.58 11/14/2016   Lab Results  Component Value Date   HGBA1C 5.3 08/29/2015     BNP No results found for: BNP  ProBNP No results found for: PROBNP   Lipid Panel     Component Value Date/Time   CHOL 127 10/21/2018 1538   TRIG 157.0 (H) 10/21/2018 1538   HDL 31.00 (L) 10/21/2018 1538   CHOLHDL 4 10/21/2018 1538   VLDL 31.4 10/21/2018 1538  [place] 65 10/21/2018 1538   LDLDIRECT 81.2 09/21/2010 0813     RADIOLOGY: No results found.   Additional studies/ records that were reviewed today include:  I reviewed the records of Dr. Belinda Fisher.  I also  reviewed GI records of Dr. Lyndel Safe.    ASSESSMENT:    1. Snoring   2. Essential hypertension   3. Excessive daytime sleepiness   4. Moderate obesity   5. Ankle edema   6. Gastroesophageal reflux disease without esophagitis     PLAN:  Zayan Delvecchio is a 54 year old moderately obese male who has a history of prior tobacco use for approximately 24 years and fortunately quit again last year.  Over the past month he has noticed progressively more abnormal sleep pattern with some insomnia, snoring, awakening gasping for breath, nocturia 2-3 times per night, the need to take frequent daytime naps with excessive daytime sleepiness.  He was recently evaluated by Dr. Rayna Manna a pause and during his evaluation and Epworth scale score endorsed at 16.  During my evaluation today an Epworth sleepiness scale score endorsed at 14 consistent with daytime sleepiness.  I had a long discussion with him and feels certain that his symptoms are distant with sleep apnea.  He has a Mallampati score of 4 on examination of his oropharynx.  He has difficulty sleeping on his back.  I discussed with him at length normal sleep architecture and the effects of sleep apnea creating malady with fragmented sleep, frequent arousals, and discussed with him that this typically is worse during REM sleep and often with supine position.  I reviewed with him the cardiovascular ramifications of untreated sleep apnea specifically with reference to hypertension, nocturnal arrhythmias, potential increased incidence of atrial fibrillation with increased incidence of recurrent AF if someone has been successfully cardioverted and also the effects of nocturnal hypoxemia on potential coronary as well as cerebrovascular ischemia.  I also discussed its implications with reference to GERD, glucose metabolism, as well as increased inflammation.  I have recommended he undergo a split-night study for evaluation.  Recently he has noticed his blood pressure  becoming more elevated and last week his blood pressure was 143/90 at home.  He tells me he has to wait 90 days before his insurance will take effect.  He started work on April 19.  We will try to set him up for a split-night study to be done once his insurance becomes available.  With his recent blood pressure elevation I am electing to add amlodipine 5 mg at bedtime.  He has a prescription for pantoprazole for GERD which he has been taking on an as-needed basis.  I will check complete set of laboratory including CMet, CBC, TSH and fasting lipid studies.  I will see him back in the office in several months following his sleep study and potential CPAP initiation.   Medication Adjustments/Labs and Tests Ordered: Current medicines are reviewed at length with the patient today.  Concerns regarding medicines are outlined above.  Medication changes, Labs and Tests ordered today are listed in the Patient Instructions below. Patient Instructions  Medication Instructions:  BEGIN TAKING AMLODIPINE 5MG AT BEDTIME  *If you need a refill on your cardiac medications before your next appointment, please call your pharmacy*  LABS: PRIOR TO FOLLOW UP WITH DR.Passion Lavin CMET TSH LIPID CBC  Testing/Procedures: Your physician has recommended that you have a sleep study. This test records several body functions during sleep, including: brain activity, eye movement, oxygen and carbon dioxide blood  levels, heart rate and rhythm, breathing rate and rhythm, the flow of air through your mouth and nose, snoring, body muscle movements, and chest and belly movement.  OUR SLEEP COORDINATOR WILL CALL TO SCHEDULE THIS APPT.  Follow-Up: AFTER YOUR SLEEP STUDY      Signed, Shelva Majestic, MD  06/18/2019 3:33 PM    [facility] [address], [place], [place], [place]  [address] Phone: [phone]

## 2019-06-12 NOTE — Patient Instructions (Signed)
Medication Instructions:  BEGIN TAKING AMLODIPINE 5MG  AT BEDTIME  *If you need a refill on your cardiac medications before your next appointment, please call your pharmacy*  LABS: PRIOR TO FOLLOW UP WITH DR.KELLY CMET TSH LIPID CBC  Testing/Procedures: Your physician has recommended that you have a sleep study. This test records several body functions during sleep, including: brain activity, eye movement, oxygen and carbon dioxide blood levels, heart rate and rhythm, breathing rate and rhythm, the flow of air through your mouth and nose, snoring, body muscle movements, and chest and belly movement.  OUR SLEEP COORDINATOR WILL CALL TO SCHEDULE THIS APPT.  Follow-Up: AFTER YOUR SLEEP STUDY

## 2019-06-18 ENCOUNTER — Encounter: Payer: Self-pay | Admitting: Cardiovascular Disease

## 2019-06-23 NOTE — Addendum Note (Signed)
Addended by: Wonda Horner on: 06/23/2019 02:43 PM   Modules accepted: Orders

## 2019-07-01 ENCOUNTER — Telehealth: Payer: Self-pay | Admitting: *Deleted

## 2019-07-01 NOTE — Telephone Encounter (Signed)
MyChart message sent to patient informing him the cost to have a sleep study.

## 2019-07-14 ENCOUNTER — Emergency Department (HOSPITAL_COMMUNITY)
Admission: EM | Admit: 2019-07-14 | Discharge: 2019-07-14 | Disposition: A | Discharge status: Home / Self Care | Payer: Self-pay | Attending: Emergency Medicine | Admitting: Emergency Medicine

## 2019-07-14 ENCOUNTER — Emergency Department (HOSPITAL_COMMUNITY): Payer: Self-pay

## 2019-07-14 ENCOUNTER — Encounter (HOSPITAL_COMMUNITY): Payer: Self-pay

## 2019-07-14 DIAGNOSIS — Z87891 Personal history of nicotine dependence: Secondary | ICD-10-CM | POA: Insufficient documentation

## 2019-07-14 DIAGNOSIS — N201 Calculus of ureter: Secondary | ICD-10-CM | POA: Insufficient documentation

## 2019-07-14 DIAGNOSIS — J45909 Unspecified asthma, uncomplicated: Secondary | ICD-10-CM | POA: Insufficient documentation

## 2019-07-14 DIAGNOSIS — Z79899 Other long term (current) drug therapy: Secondary | ICD-10-CM | POA: Insufficient documentation

## 2019-07-14 LAB — CBC
HCT: 45.8 % (ref 39.0–52.0)
Hemoglobin: 15 g/dL (ref 13.0–17.0)
MCH: 29.3 pg (ref 26.0–34.0)
MCHC: 32.8 g/dL (ref 30.0–36.0)
MCV: 89.5 fL (ref 80.0–100.0)
Platelets: 211 10*3/uL (ref 150–400)
RBC: 5.12 MIL/uL (ref 4.22–5.81)
RDW: 12.9 % (ref 11.5–15.5)
WBC: 14.2 10*3/uL — ABNORMAL HIGH (ref 4.0–10.5)
nRBC: 0 % (ref 0.0–0.2)

## 2019-07-14 LAB — COMPREHENSIVE METABOLIC PANEL
ALT: 19 U/L (ref 0–44)
AST: 22 U/L (ref 15–41)
Albumin: 4.3 g/dL (ref 3.5–5.0)
Alkaline Phosphatase: 76 U/L (ref 38–126)
Anion gap: 10 (ref 5–15)
BUN: 13 mg/dL (ref 6–20)
CO2: 26 mmol/L (ref 22–32)
Calcium: 8.9 mg/dL (ref 8.9–10.3)
Chloride: 102 mmol/L (ref 98–111)
Creatinine, Ser: 1.04 mg/dL (ref 0.61–1.24)
GFR calc Af Amer: >60 mL/min (ref >60)
GFR calc non Af Amer: >60 mL/min (ref >60)
Glucose, Bld: 106 mg/dL — ABNORMAL HIGH (ref 70–99)
Potassium: 4.6 mmol/L (ref 3.5–5.1)
Sodium: 138 mmol/L (ref 135–145)
Total Bilirubin: 1 mg/dL (ref 0.3–1.2)
Total Protein: 7.9 g/dL (ref 6.5–8.1)

## 2019-07-14 LAB — LIPASE, BLOOD: Lipase: 33 U/L (ref 11–51)

## 2019-07-14 MED ORDER — IBUPROFEN 200 MG PO TABS
600.0000 mg | ORAL_TABLET | Freq: Once | ORAL | Status: AC
  Administered 2019-07-14: 600 mg via ORAL
  Filled 2019-07-14: qty 3

## 2019-07-14 MED ORDER — HYDROCODONE-ACETAMINOPHEN 5-325 MG PO TABS
1.0000 | ORAL_TABLET | Freq: Once | ORAL | Status: AC
  Administered 2019-07-14: 1 via ORAL
  Filled 2019-07-14: qty 1

## 2019-07-14 MED ORDER — SODIUM CHLORIDE 0.9% FLUSH: 3.0000 mL | Freq: Once | INTRAVENOUS | Status: DC

## 2019-07-14 MED ORDER — ONDANSETRON HCL 4 MG PO TABS
4.0000 mg | ORAL_TABLET | Freq: Four times a day (QID) | ORAL | 0 refills | Status: DC
Start: 2019-07-14 — End: 2019-10-23

## 2019-07-14 MED ORDER — OXYCODONE-ACETAMINOPHEN 5-325 MG PO TABS: 1.0000 | ORAL_TABLET | ORAL | 0 refills | Status: DC | PRN

## 2019-07-14 NOTE — Discharge Instructions (Addendum)
Take 600 mg of ibuprofen every 6 hours as needed for pain. Percocet for break through pain. Zofran as needed for nausea. Return to ER for uncontrolled pain/nausea or if you develop a fever.

## 2019-07-14 NOTE — ED Notes (Signed)
An After Visit Summary was printed and given to the patient. Discharge instructions given and no further questions at this time.  

## 2019-07-15 ENCOUNTER — Other Ambulatory Visit (HOSPITAL_BASED_OUTPATIENT_CLINIC_OR_DEPARTMENT_OTHER): Payer: Self-pay

## 2019-07-15 DIAGNOSIS — R5383 Other fatigue: Secondary | ICD-10-CM

## 2019-07-21 NOTE — ED Provider Notes (Signed)
[facility] DEPT Provider Note   CSN: [id number] Arrival date & time: 07/14/19  1624     History Chief Complaint  Patient presents with  . Abdominal Pain  . Nausea    Mario Jimenez is a 54 y.o. male.  HPI   54 year old male with abdominal pain.  Right-sided.  Initially started as a dull ache and then much sharper/stronger pain.  Associated nausea.  No vomiting.  No fevers or chills.  No acute urinary complaints.  No change in bowel movements.  Past Medical History:  Diagnosis Date  . Allergy   . Asthma mild   no inhaler  . Gallbladder polyp 2018   76mm polyp  . GERD (gastroesophageal reflux disease) occasional   watches diet and takes diet  . Hemorrhoids    h/o Cscope   . History of MRSA infection few yrs ago while in hospital in [place]  . Meniscus tear 8-12   to have surgery-- left knee  . Urolithiasis 2007-2008   ([place]) left kidney  3 surgeries ; has passed stones since then     Patient Active Problem List   Diagnosis Date Noted  . Gallbladder polyp 06/24/2017  . Diverticulitis 06/24/2017  . PCP NOTES >>>>>>>>>>>>>>>>>. 03/21/2016  . Rhinitis 09/24/2013  . Hemorrhoids 08/16/2011  . Annual physical exam 09/19/2010  . Urolithiasis     Past Surgical History:  Procedure Laterality Date  . CYSTOSCOPY/RETROGRADE/URETEROSCOPY/STONE EXTRACTION WITH BASKET  2007-- x2  . KNEE ARTHROSCOPY  12/05/2010   Procedure: ARTHROSCOPY KNEE;  Surgeon: Laurice Record Aplington;  Location: [facility];  Service: Orthopedics;  Laterality: Left;  LEFT KNEE ARTHROSCOPY WITH PARTIAL MEDIAL  MENISECTOMY, Total lateral menisectomy, Shaving of Medial Femoral condyle  . leg surgery  62- age 68   Left , s/p traction, body cast       Family History  Problem Relation Age of Onset  . Coronary artery disease Father        F, MI 65 y/o  . Stroke Mother        M d/t brain tumor   . Cancer Mother        M brain tumor  . Colon cancer Maternal  Grandmother   . Diabetes Neg Hx   . Prostate cancer Neg Hx   . Esophageal cancer Neg Hx     Social History   Tobacco Use  . Smoking status: Former Smoker    Quit date: 08/2018    Years since quitting: 0.9  . Smokeless tobacco: Never Used  Vaping Use  . Vaping Use: Never used  Substance Use Topics  . Alcohol use: Yes    Comment: social  . Drug use: No    Home Medications Prior to Admission medications   Medication Sig Start Date End Date Taking? Authorizing Provider  amLODipine (NORVASC) 5 MG tablet Take 1 tablet (5 mg total) by mouth daily. TAKE AT BED TIME 06/12/19 09/10/19 Yes Troy Sine, MD  ibuprofen (ADVIL) 200 MG tablet Take 200 mg by mouth every 6 (six) hours as needed for moderate pain.   Yes [provider]  naproxen sodium (ALEVE) 220 MG tablet Take 220 mg by mouth 2 (two) times daily as needed (pain).   Yes [provider]  ondansetron (ZOFRAN) 4 MG tablet Take 1 tablet (4 mg total) by mouth every 6 (six) hours. 07/14/19   Virgel Manifold, MD  oxyCODONE-acetaminophen (PERCOCET/ROXICET) 5-325 MG tablet Take 1 tablet by mouth every 4 (four) hours  as needed for severe pain. 07/14/19   Virgel Manifold, MD  pantoprazole (PROTONIX) 40 MG tablet Take 1 tablet (40 mg total) by mouth daily before breakfast. Use as needed Patient not taking: Reported on 07/14/2019 10/21/18   Colon Branch, MD    Allergies    Contrast media [iodinated diagnostic agents], Ioxaglate, and Sulfa antibiotics  Review of Systems   Review of Systems All systems reviewed and negative, other than as noted in HPI.  Physical Exam Updated Vital Signs BP 135/87   Pulse 73   Temp 98.1 F (36.7 C) (Oral)   Resp 18   SpO2 100%   Physical Exam Vitals and nursing note reviewed.  Constitutional:      General: He is not in acute distress.    Appearance: He is well-developed.  HENT:     Head: Normocephalic and atraumatic.  Eyes:     General:        Right eye: No discharge.         Left eye: No discharge.     Conjunctiva/sclera: Conjunctivae normal.  Cardiovascular:     Rate and Rhythm: Normal rate and regular rhythm.     Heart sounds: Normal heart sounds. No murmur heard.  No friction rub. No gallop.   Pulmonary:     Effort: Pulmonary effort is normal. No respiratory distress.     Breath sounds: Normal breath sounds.  Abdominal:     General: There is no distension.     Palpations: Abdomen is soft.     Tenderness: There is no abdominal tenderness.  Musculoskeletal:        General: No tenderness.     Cervical back: Neck supple.  Skin:    General: Skin is warm and dry.  Neurological:     Mental Status: He is alert.  Psychiatric:        Behavior: Behavior normal.        Thought Content: Thought content normal.     ED Results / Procedures / Treatments   Labs (all labs ordered are listed, but only abnormal results are displayed) Labs Reviewed  COMPREHENSIVE METABOLIC PANEL - Abnormal; Notable for the following components:      Result Value   Glucose, Bld 106 (*)    All other components within normal limits  CBC - Abnormal; Notable for the following components:   WBC 14.2 (*)    All other components within normal limits  LIPASE, BLOOD    EKG None  Radiology No results found.   CT ABDOMEN PELVIS WO CONTRAST  Result Date: 07/14/2019 CLINICAL DATA:  Abdominal pain EXAM: CT ABDOMEN AND PELVIS WITHOUT CONTRAST TECHNIQUE: Multidetector CT imaging of the abdomen and pelvis was performed following the standard protocol without IV contrast. COMPARISON:  12/14/2017 FINDINGS: Lower chest: Lung bases are clear. No effusions. Heart is normal size. Hepatobiliary: No focal hepatic abnormality. Gallbladder unremarkable. Pancreas: No focal abnormality or ductal dilatation. Spleen: No focal abnormality.  Normal size. Adrenals/Urinary Tract: 10 mm nonobstructing stone in the lower pole of the left kidney. 4 mm stone in the lower pole of the right kidney. 4 mm proximal  right ureteral stone. No hydronephrosis. Urinary bladder and adrenal glands unremarkable. Stomach/Bowel: Normal appendix. Sigmoid diverticulosis. No active diverticulitis. No bowel obstruction. Vascular/Lymphatic: No evidence of aneurysm or adenopathy. Reproductive: No visible focal abnormality. Other: No free fluid or free air. Musculoskeletal: No acute bony abnormality. IMPRESSION: Bilateral nephrolithiasis. 4 mm proximal right ureteral stone. No hydronephrosis. Sigmoid diverticulosis.  No active  diverticulitis. Electronically Signed   By: Rolm Baptise M.D.   On: 07/14/2019 21:20    Procedures Procedures (including critical care time)  Medications Ordered in ED Medications  HYDROcodone-acetaminophen (NORCO/VICODIN) 5-325 MG per tablet 1 tablet (1 tablet Oral Given 07/14/19 2225)  ibuprofen (ADVIL) tablet 600 mg (600 mg Oral Given 07/14/19 2225)    ED Course  I have reviewed the triage vital signs and the nursing notes.  Pertinent labs & imaging results that were available during my care of the patient were reviewed by me and considered in my medical decision making (see chart for details).    MDM Rules/Calculators/A&P                          54 year old male with right ureteral stone.  Symptoms now adequately controlled.  Afebrile.  Well-appearing.  Playing and expectant management.  Emergent return precautions were discussed.  Urology follow-up otherwise. Final Clinical Impression(s) / ED Diagnoses Final diagnoses:  Right ureteral stone    Rx / DC Orders ED Discharge Orders         Ordered    oxyCODONE-acetaminophen (PERCOCET/ROXICET) 5-325 MG tablet  Every 4 hours PRN     Discontinue  Reprint     07/14/19 2249    ondansetron (ZOFRAN) 4 MG tablet  Every 6 hours     Discontinue  Reprint     07/14/19 2249           Virgel Manifold, MD 07/21/19 [phone]

## 2019-07-26 ENCOUNTER — Other Ambulatory Visit: Payer: Self-pay

## 2019-07-26 ENCOUNTER — Ambulatory Visit (HOSPITAL_BASED_OUTPATIENT_CLINIC_OR_DEPARTMENT_OTHER): Payer: Self-pay | Attending: *Deleted | Admitting: Internal Medicine

## 2019-07-26 VITALS — Ht 73.0 in | Wt 275.0 lb

## 2019-07-26 DIAGNOSIS — G4733 Obstructive sleep apnea (adult) (pediatric): Secondary | ICD-10-CM | POA: Insufficient documentation

## 2019-07-26 DIAGNOSIS — R5383 Other fatigue: Secondary | ICD-10-CM

## 2019-08-02 DIAGNOSIS — R5383 Other fatigue: Secondary | ICD-10-CM

## 2019-08-02 NOTE — Procedures (Signed)
Patient Name: Mario Jimenez, [place] Date: 07/26/2019 Gender: Male D.O.B: Sep 09, 1965 Age (years): 84 Referring Provider: Marliss Coots NP Height (inches): 73 Interpreting Physician: Baird Lyons MD, ABSM Weight (lbs): 275 RPSGT: Earney [place] BMI: 36 MRN: [id number] Neck Size: 19.00  CLINICAL INFORMATION Sleep Study Type: Split Night CPAP Indication for sleep study: Snoring Epworth Sleepiness Score:  14  SLEEP STUDY TECHNIQUE As per the AASM Manual for the Scoring of Sleep and Associated Events v2.3 (April 2016) with a hypopnea requiring 4% desaturations.  The channels recorded and monitored were frontal, central and occipital EEG, electrooculogram (EOG), submentalis EMG (chin), nasal and oral airflow, thoracic and abdominal wall motion, anterior tibialis EMG, snore microphone, electrocardiogram, and pulse oximetry. Continuous positive airway pressure (CPAP) was initiated when the patient met split night criteria and was titrated according to treat sleep-disordered breathing.  MEDICATIONS Medications self-administered by patient taken the night of the study : ALEVE PM, AMLODIPINE  RESPIRATORY PARAMETERS Diagnostic  Total AHI (/hr): 47.6 RDI (/hr): 74.2 OA Index (/hr): 23.3 CA Index (/hr): 0.9 REM AHI (/hr): N/A NREM AHI (/hr): 47.6 Supine AHI (/hr): 70.6 Non-supine AHI (/hr): 32.5 Min O2 Sat (%): 88.0 Mean O2 (%): 95.5 Time below 88% (min): 0   Titration  Optimal Pressure (cm): 15 AHI at Optimal Pressure (/hr): 0.0 Min O2 at Optimal Pressure (%): 91.0 Supine % at Optimal (%): 0 Sleep % at Optimal (%): 53   SLEEP ARCHITECTURE The recording time for the entire night was 400.7 minutes.  During a baseline period of 238.1 minutes, the patient slept for 128.5 minutes in REM and nonREM, yielding a sleep efficiency of 54.0%%. Sleep onset after lights out was 39.4 minutes with a REM latency of N/A minutes. The patient spent 1.2%% of the night in stage N1 sleep, 98.8%% in stage  N2 sleep, 0.0%% in stage N3 and 0% in REM.   During the titration period of 154.6 minutes, the patient slept for 93.5 minutes in REM and nonREM, yielding a sleep efficiency of 60.5%%. Sleep onset after CPAP initiation was 20.8 minutes with a REM latency of 10.5 minutes. The patient spent 1.6%% of the night in stage N1 sleep, 38.5%% in stage N2 sleep, 15.5%% in stage N3 and 44.4% in REM.  CARDIAC DATA The 2 lead EKG demonstrated sinus rhythm. The mean heart rate was 100.0 beats per minute. Other EKG findings include: None.  LEG MOVEMENT DATA The total Periodic Limb Movements of Sleep (PLMS) were 0. The PLMS index was 0.0 .  IMPRESSIONS - Severe obstructive sleep apnea occurred during the diagnostic portion of the study (AHI = 47.6/hour). An optimal PAP pressure was selected for this patient ( 15 cm of water) - No significant central sleep apnea occurred during the diagnostic portion of the study (CAI = 0.9/hour). - Mild oxygen desaturation was noted during the diagnostic portion of the study (Min O2 = 88.0%). Min sat on CPAP 15 was 91%. - The patient snored with loud snoring volume during the diagnostic portion of the study. - No cardiac abnormalities were noted during this study. - Clinically significant periodic limb movements did not occur during sleep.  DIAGNOSIS - Obstructive Sleep Apnea (G47.33)  RECOMMENDATIONS - Trial of CPAP therapy on 15 cm H2O or autopap 10-20. - Patient used a Large size Fisher&Paykel Full Face Mask Simplus mask and heated humidification. - Be careful with alcohol, sedatives and other CNS depressants that may worsen sleep apnea and disrupt normal sleep architecture. - Sleep hygiene should be reviewed  to assess factors that may improve sleep quality. - Weight management and regular exercise should be initiated or continued.  [Electronically signed] 08/02/2019 08:52 AM  Baird Lyons MD, ABSM Diplomate, American Board of Sleep Medicine   NPI:  [id number]                          [place] [facility]  ELECTRONICALLY SIGNED ON:  08/02/2019, 8:49 AM [place] PH: [phone]   FX: [phone] [place]

## 2019-08-27 ENCOUNTER — Telehealth: Payer: Self-pay | Admitting: *Deleted

## 2019-08-27 NOTE — Telephone Encounter (Signed)
Returned a call to patient. He informed me that he now has insurance with [facility] and wants to get his CPAP machine. He had a sleep study recently read by Dr Annamaria Boots. I told the patient that I can order his CPAP machine once he gets his insurance information to me.

## 2019-08-27 NOTE — Telephone Encounter (Signed)
Patient is following up to provide his insurance information for [employer]:   [employer]#- [id number] Member ID#- [id number]

## 2019-08-27 NOTE — Telephone Encounter (Signed)
Orders for CPAP sent to [facility].

## 2019-09-03 NOTE — Telephone Encounter (Signed)
Patient is calling to follow up regarding order for CPAP. He states [facility] has not received order. He would like a call back to discuss.

## 2019-09-23 ENCOUNTER — Telehealth: Payer: Self-pay | Admitting: *Deleted

## 2019-09-23 NOTE — Telephone Encounter (Signed)
Patient states his CPAP order needs to be faxed to [facility] in [facility]. Fax: [phone]

## 2019-10-13 ENCOUNTER — Encounter: Payer: Self-pay | Admitting: Internal Medicine

## 2019-10-23 ENCOUNTER — Encounter: Payer: Self-pay | Admitting: Internal Medicine

## 2019-10-23 ENCOUNTER — Other Ambulatory Visit: Payer: Self-pay

## 2019-10-23 ENCOUNTER — Ambulatory Visit (INDEPENDENT_AMBULATORY_CARE_PROVIDER_SITE_OTHER): Payer: 59 | Admitting: Internal Medicine

## 2019-10-23 VITALS — BP 124/80 | HR 76 | Temp 98.1°F | Resp 18 | Ht 73.0 in | Wt 285.4 lb

## 2019-10-23 DIAGNOSIS — N211 Calculus in urethra: Secondary | ICD-10-CM

## 2019-10-23 DIAGNOSIS — K824 Cholesterolosis of gallbladder: Secondary | ICD-10-CM | POA: Diagnosis not present

## 2019-10-23 DIAGNOSIS — Z0001 Encounter for general adult medical examination with abnormal findings: Secondary | ICD-10-CM | POA: Diagnosis not present

## 2019-10-23 DIAGNOSIS — E669 Obesity, unspecified: Secondary | ICD-10-CM

## 2019-10-23 DIAGNOSIS — G2581 Restless legs syndrome: Secondary | ICD-10-CM

## 2019-10-23 DIAGNOSIS — Z23 Encounter for immunization: Secondary | ICD-10-CM

## 2019-10-23 DIAGNOSIS — Z0000 Encounter for general adult medical examination without abnormal findings: Secondary | ICD-10-CM

## 2019-10-23 DIAGNOSIS — Z6837 Body mass index (BMI) 37.0-37.9, adult: Secondary | ICD-10-CM

## 2019-10-23 NOTE — Progress Notes (Addendum)
Subjective:    Patient ID: Mario Jimenez, male    DOB: 06-Nov-1965, 54 y.o.   MRN: [id number]  DOS:  10/23/2019 Type of visit - description: CPX Here for CPX.  Reports lower extremity edema for few months. Denies chest pain or difficulty breathing.  Recently diagnosed with sleep apnea, started CPAP, has make a significant difference on his energy.  RLS?  Occasionally feels achiness at the legs, mostly at rest, decreased sxs when shakes his legs  Went to the ER 06/2019 with kidney stones, symptoms essentially resolved, occasionally has nocturia. Denies dysuria, gross hematuria or further pain.  Wt Readings from Last 3 Encounters:  10/23/19 285 lb 6 oz (129.4 kg)  07/26/19 275 lb (124.7 kg)  06/12/19 280 lb (127 kg)     Review of Systems  Other than above, a 14 point review of systems is negative     Past Medical History:  Diagnosis Date  . Allergy   . Asthma mild   no inhaler  . Gallbladder polyp 2018   70mm polyp  . GERD (gastroesophageal reflux disease) occasional   watches diet and takes diet  . Hemorrhoids    h/o Cscope   . History of MRSA infection few yrs ago while in hospital in [place]  . Meniscus tear 8-12   to have surgery-- left knee  . OSA on CPAP   . Urolithiasis 2007-2008   [place]  3 surgeries ; has passed stones since then     Past Surgical History:  Procedure Laterality Date  . CYSTOSCOPY/RETROGRADE/URETEROSCOPY/STONE EXTRACTION WITH BASKET  2007-- x2  . KNEE ARTHROSCOPY  12/05/2010   Procedure: ARTHROSCOPY KNEE;  Surgeon: Laurice Record [place];  Location: [facility];  Service: Orthopedics;  Laterality: Left;  LEFT KNEE ARTHROSCOPY WITH PARTIAL MEDIAL  MENISECTOMY, Total lateral menisectomy, Shaving of Medial Femoral condyle  . leg surgery  1977- age 89   Left , s/p traction, body cast    Allergies as of 10/23/2019      Reactions   Contrast Media [iodinated Diagnostic Agents] Hives   Ioxaglate Hives   Sulfa Antibiotics  Hives      Medication List       Accurate as of October 23, 2019 11:59 PM. If you have any questions, ask your nurse or doctor.        STOP taking these medications   amLODipine 5 MG tablet Commonly known as: NORVASC Stopped by: Kathlene November, MD   ondansetron 4 MG tablet Commonly known as: ZOFRAN Stopped by: Kathlene November, MD   oxyCODONE-acetaminophen 5-325 MG tablet Commonly known as: PERCOCET/ROXICET Stopped by: Kathlene November, MD   pantoprazole 40 MG tablet Commonly known as: PROTONIX Stopped by: Kathlene November, MD     TAKE these medications   ibuprofen 200 MG tablet Commonly known as: ADVIL Take 200 mg by mouth every 6 (six) hours as needed for moderate pain.   naproxen sodium 220 MG tablet Commonly known as: ALEVE Take 220 mg by mouth 2 (two) times daily as needed (pain).          Objective:   Physical Exam BP 124/80 (BP Location: Left Arm, Patient Position: Sitting, Cuff Size: Normal)   Pulse 76   Temp 98.1 F (36.7 C) (Oral)   Resp 18   Ht 6\' 1"  (1.854 m)   Wt 285 lb 6 oz (129.4 kg)   SpO2 98%   BMI 37.65 kg/m  General: Well developed, NAD, BMI noted Neck: No  thyromegaly  HEENT:  Normocephalic . Face symmetric, atraumatic Lungs:  CTA B Normal respiratory effort, no intercostal retractions, no accessory muscle use. Heart: RRR,  no murmur.  Abdomen:  Not distended, soft, non-tender. No rebound or rigidity.   Lower extremities: +/+++  pretibial edema bilaterally  Skin: Exposed areas without rash. Not pale. Not jaundice Neurologic:  alert & oriented X3.  Speech normal, gait appropriate for age and unassisted Strength symmetric and appropriate for age.  Psych: Cognition and judgment appear intact.  Cooperative with normal attention span and concentration.  Behavior appropriate. No anxious or depressed appearing.     Assessment    ASSESSMENT Asthma GERD Chronic sinusitis, Allergies Urolithiasis History of diverticulitis GB polyp, saw surgery/2018, rec  a [place] in 6 months. Surgery if polyp >  10 mm Smoker: quit 08/2018 Chronic sinusitis: Seen by ENT 2018, Dr Laurance Flatten, rx medical treatment, no polyps OSA dx 07/2019   PLAN: Here for CPX Elevated BP: Cardiology felt that the BP was a slightly elevated and started amlodipine few months ago, he has developed leg edema, will stop amlodipine monitor BPs.  Reassess in 4 months OSA: Diagnosed 07-2019, started CPAP 09/28/2019, energy level much improved.  Feeling well. Urolithiasis: went to the ER w/ pain, CT >> Bilateral nephrolithiasis. 4 mm proximal right ureteral stone. No hydronephrosis. No pain since but is not sure if he pass a stone, request uology referral RLS?  Symptoms as described above, will check iron and ferritin, recommend tonic water at night, if not better will consider other medications. Gallbladder polyps: Needs a ultrasound, patient will call back and let us know what facility he likes to use. RTC 4 months  In addition to CPX, we assess elevated BP, gallbladder polyp, question of RLS.   This visit occurred during the SARS-CoV-2 public health emergency.  Safety protocols were in place, including screening questions prior to the visit, additional usage of staff PPE, and extensive cleaning of exam room while observing appropriate contact time as indicated for disinfecting solutions.

## 2019-10-23 NOTE — Patient Instructions (Addendum)
Happy belated Rudene Anda!  Stop amlodipine, see if the swelling decreases.  Check the  blood pressure every week. BP GOAL is between 110/65 and  135/85. If it is consistently higher or lower, let me know  Watch your salt intake  Watch your diet  RLS?  You may like to try diet tonic water at night, half or 1 glass  Call the office and let us know where you would like to do your ultrasound  GO TO THE LAB : Get the blood work     [place], [place] back for a checkup in 4 months

## 2019-10-23 NOTE — Progress Notes (Signed)
Pre visit review using our clinic review tool, if applicable. No additional management support is needed unless otherwise documented below in the visit note. 

## 2019-10-24 LAB — IRON, TOTAL/TOTAL IRON BINDING CAP
%SAT: 34 % (calc) (ref 20–48)
Iron: 105 ug/dL (ref 50–180)
TIBC: 313 mcg/dL (calc) (ref 250–425)

## 2019-10-24 LAB — CBC WITH DIFFERENTIAL/PLATELET
Absolute Monocytes: 506 cells/uL (ref 200–950)
Basophils Absolute: 33 cells/uL (ref 0–200)
Basophils Relative: 0.3 %
Eosinophils Absolute: 231 cells/uL (ref 15–500)
Eosinophils Relative: 2.1 %
HCT: 41.4 % (ref 38.5–50.0)
Hemoglobin: 13.5 g/dL (ref 13.2–17.1)
Lymphs Abs: 2871 cells/uL (ref 850–3900)
MCH: 28.7 pg (ref 27.0–33.0)
MCHC: 32.6 g/dL (ref 32.0–36.0)
MCV: 87.9 fL (ref 80.0–100.0)
MPV: 10.8 fL (ref 7.5–12.5)
Monocytes Relative: 4.6 %
Neutro Abs: 7359 cells/uL (ref 1500–7800)
Neutrophils Relative %: 66.9 %
Platelets: 196 10*3/uL (ref 140–400)
RBC: 4.71 10*6/uL (ref 4.20–5.80)
RDW: 12.6 % (ref 11.0–15.0)
Total Lymphocyte: 26.1 %
WBC: 11 10*3/uL — ABNORMAL HIGH (ref 3.8–10.8)

## 2019-10-24 LAB — FERRITIN: Ferritin: 142 ng/mL (ref 38–380)

## 2019-10-24 LAB — LIPID PANEL
Cholesterol: 130 mg/dL (ref <200)
HDL: 36 mg/dL — ABNORMAL LOW (ref >=40)
LDL Cholesterol (Calc): 74 mg/dL (calc)
Non-HDL Cholesterol (Calc): 94 mg/dL (calc) (ref <130)
Total CHOL/HDL Ratio: 3.6 (calc) (ref <5.0)
Triglycerides: 114 mg/dL (ref <150)

## 2019-10-24 LAB — HEMOGLOBIN A1C
Hgb A1c MFr Bld: 5.3 % of total Hgb (ref <5.7)
Mean Plasma Glucose: 105 (calc)
eAG (mmol/L): 5.8 (calc)

## 2019-10-24 LAB — BASIC METABOLIC PANEL
BUN: 11 mg/dL (ref 7–25)
CO2: 28 mmol/L (ref 20–32)
Calcium: 9.1 mg/dL (ref 8.6–10.3)
Chloride: 103 mmol/L (ref 98–110)
Creat: 0.97 mg/dL (ref 0.70–1.33)
Glucose, Bld: 82 mg/dL (ref 65–99)
Potassium: 4.5 mmol/L (ref 3.5–5.3)
Sodium: 139 mmol/L (ref 135–146)

## 2019-10-24 NOTE — Assessment & Plan Note (Signed)
-  Td 09-2011 -Interested in the Covid vaccine, encouraged to proceed. -Flu shot today -HQU:IQNVVY 2006 @ [facility] (d/t diverticulitis?); Cscope 07-2017,  Rectal polyp, internal and external hemorrhoids grade 4.  Next: 5 years -prostate ca screening: DRE and PSA normal 10-2018 -Labs: BMP, FLP, CBC, A1c -Weight loss strongly recommended, diet discussed.

## 2019-10-24 NOTE — Assessment & Plan Note (Signed)
Here for CPX Elevated BP: Cardiology felt that the BP was a slightly elevated and started amlodipine few months ago, he has developed leg edema, will stop amlodipine monitor BPs.  Reassess in 4 months OSA: Diagnosed 07-2019, started CPAP 09/28/2019, energy level much improved.  Feeling well. Urolithiasis: went to the ER w/ pain, CT >> Bilateral nephrolithiasis. 4 mm proximal right ureteral stone. No hydronephrosis. No pain since but is not sure if he pass a stone, request uology referral RLS?  Symptoms as described above, will check iron and ferritin, recommend tonic water at night, if not better will consider other medications. Gallbladder polyps: Needs a ultrasound, patient will call back and let us know what facility he likes to use. RTC 4 months

## 2019-10-27 NOTE — Addendum Note (Signed)
Addended by: Kathlene November E on: 10/27/2019 08:29 PM   Modules accepted: Orders

## 2019-10-29 ENCOUNTER — Telehealth: Payer: Self-pay | Admitting: *Deleted

## 2019-10-29 NOTE — Telephone Encounter (Signed)
CPAP download reviewed by Dr. Claiborne Billings.   No changes recommended at this time.

## 2019-12-17 ENCOUNTER — Other Ambulatory Visit: Payer: Self-pay

## 2019-12-17 ENCOUNTER — Encounter: Payer: Self-pay | Admitting: Family Medicine

## 2019-12-17 ENCOUNTER — Telehealth (INDEPENDENT_AMBULATORY_CARE_PROVIDER_SITE_OTHER): Payer: 59 | Admitting: Family Medicine

## 2019-12-17 DIAGNOSIS — R0981 Nasal congestion: Secondary | ICD-10-CM | POA: Diagnosis not present

## 2019-12-17 DIAGNOSIS — R220 Localized swelling, mass and lump, head: Secondary | ICD-10-CM

## 2019-12-17 MED ORDER — LEVOCETIRIZINE DIHYDROCHLORIDE 5 MG PO TABS: 5.0000 mg | ORAL_TABLET | Freq: Every evening | ORAL | 2 refills | Status: DC

## 2019-12-17 MED ORDER — PREDNISONE 20 MG PO TABS: 40.0000 mg | ORAL_TABLET | Freq: Every day | ORAL | 0 refills | Status: AC

## 2019-12-17 NOTE — Progress Notes (Signed)
Chief Complaint  Patient presents with  . Sinusitis    sinus infection    Mario Jimenez here for URI complaints. Due to COVID-19 pandemic, we are interacting via web portal for an electronic face-to-face visit. I verified patient's ID using 2 identifiers. Patient agreed to proceed with visit via this method. Patient is at home, I am at office. Patient and I are present for visit.   Duration: 8 days  Associated symptoms: sinus congestion, ear fullness and facial swelling Denies: sinus pain, rhinorrhea, itchy watery eyes, ear pain, ear drainage, sore throat, wheezing, shortness of breath, myalgia and fevers Treatment to date: Flonase, Mucinex DM Sick contacts: Yes  Past Medical History:  Diagnosis Date  . Allergy   . Asthma mild   no inhaler  . Gallbladder polyp 2018   32mm polyp  . GERD (gastroesophageal reflux disease) occasional   watches diet and takes diet  . Hemorrhoids    h/o Cscope   . History of MRSA infection few yrs ago while in hospital in [place]  . Meniscus tear 8-12   to have surgery-- left knee  . OSA on CPAP   . Urolithiasis 2007-2008   [place] left kidney  3 surgeries ; has passed stones since then    Exam No conversational dyspnea Age appropriate judgment and insight Nml affect and mood  Nasal congestion - Plan: levocetirizine (XYZAL) 5 MG tablet  Facial swelling - Plan: predniSONE (DELTASONE) 20 MG tablet, levocetirizine (XYZAL) 5 MG tablet  Start Xyzal. If no improvement by tomorrow, started pred burst for 5 d, 40 mg/d. Will tx as a histamine response. No swelling in mouth, lips, or throat, no SOB. No urticaria reported.  Continue to push fluids, practice good hand hygiene, cover mouth when coughing. F/u next week if needed. If starting to experience fevers, shaking, or shortness of breath, seek immediate care. Pt voiced understanding and agreement to the plan.  [place], DO 12/17/19 3:29 PM

## 2019-12-19 ENCOUNTER — Other Ambulatory Visit: Payer: 59

## 2019-12-19 DIAGNOSIS — Z20822 Contact with and (suspected) exposure to covid-19: Secondary | ICD-10-CM

## 2019-12-21 ENCOUNTER — Ambulatory Visit: Payer: Self-pay | Admitting: Cardiovascular Disease

## 2019-12-21 LAB — NOVEL CORONAVIRUS, NAA: SARS-CoV-2, NAA: DETECTED — AB

## 2019-12-21 LAB — SARS-COV-2, NAA 2 DAY TAT

## 2019-12-21 NOTE — Telephone Encounter (Signed)
Patient called to check status of message sent to provider this morning via mychart.

## 2019-12-22 ENCOUNTER — Encounter: Payer: Self-pay | Admitting: Nurse Practitioner

## 2019-12-22 ENCOUNTER — Other Ambulatory Visit: Payer: Self-pay | Admitting: Nurse Practitioner

## 2019-12-22 DIAGNOSIS — U071 COVID-19: Secondary | ICD-10-CM

## 2019-12-22 NOTE — Progress Notes (Signed)
I connected by phone with Mario Jimenez on 12/22/2019 at 1:31 PM to discuss the potential use of a treatment for mild to moderate COVID-19 viral infection in non-hospitalized patients.  This patient is a 54 y.o. male that meets the FDA criteria for Emergency Use Authorization of bamlanivimab/etesevimab, casirivimab\imdevimab, or sotrovimab  Has a (+) direct SARS-CoV-2 viral test result  Has mild or moderate COVID-19   Is ? 52 years of age and weighs ? 40 kg  Is NOT hospitalized due to COVID-19  Is NOT requiring oxygen therapy or requiring an increase in baseline oxygen flow rate due to COVID-19  Is within 10 days of symptom onset  Has at least one of the high risk factor(s) for progression to severe COVID-19 and/or hospitalization as defined in EUA.  Specific high risk criteria : BMI > 25, Cardiovascular disease or hypertension and Other high risk medical condition per CDC:  SVI   I have spoken and communicated the following to the patient or parent/caregiver:  1. FDA has authorized the emergency use of bamlanivimab/etesevimab, casirivimab\imdevimab, or sotrovimab for the treatment of mild to moderate COVID-19 in adults and pediatric patients with positive results of direct SARS-CoV-2 viral testing who are 84 years of age and older weighing at least 40 kg, and who are at high risk for progressing to severe COVID-19 and/or hospitalization.  2. The significant known and potential risks and benefits of bamlanivimab/etesevimab, casirivimab\imdevimab, or sotrovimab, and the extent to which such potential risks and benefits are unknown.  3. Information on available alternative treatments and the risks and benefits of those alternatives, including clinical trials.  4. Patients treated with bamlanivimab/etesevimab, casirivimab\imdevimab, or sotrovimab should continue to self-isolate and use infection control measures (e.g., wear mask, isolate, social distance, avoid sharing personal items, clean  and disinfect "high touch" surfaces, and frequent handwashing) according to CDC guidelines.   5. The patient or parent/caregiver has the option to accept or refuse bamlanivimab/etesevimab, casirivimab\imdevimab, or sotrovimab.  After reviewing this information with the patient, the patient has agreed to receive one of the available covid 19 monoclonal antibodies and will be provided an appropriate fact sheet prior to infusion.Beckey Rutter, [place], AGNP-C [phone] ([place])

## 2019-12-23 ENCOUNTER — Ambulatory Visit (HOSPITAL_COMMUNITY)
Admission: RE | Admit: 2019-12-23 | Discharge: 2019-12-23 | Disposition: A | Discharge status: Home / Self Care | Payer: 59 | Source: Ambulatory Visit | Attending: Pulmonary Disease | Admitting: Pulmonary Disease

## 2019-12-23 DIAGNOSIS — U071 COVID-19: Secondary | ICD-10-CM | POA: Diagnosis present

## 2019-12-23 MED ORDER — SODIUM CHLORIDE 0.9 % IV SOLN
Freq: Once | INTRAVENOUS | Status: AC
  Filled 2019-12-23: qty 5

## 2019-12-23 MED ORDER — SODIUM CHLORIDE 0.9 % IV SOLN: INTRAVENOUS | Status: DC | PRN

## 2019-12-23 MED ORDER — METHYLPREDNISOLONE SODIUM SUCC 125 MG IJ SOLR: 125.0000 mg | Freq: Once | INTRAMUSCULAR | Status: DC | PRN

## 2019-12-23 MED ORDER — FAMOTIDINE IN NACL 20-0.9 MG/50ML-% IV SOLN: 20.0000 mg | Freq: Once | INTRAVENOUS | Status: DC | PRN

## 2019-12-23 MED ORDER — DIPHENHYDRAMINE HCL 50 MG/ML IJ SOLN: 50.0000 mg | Freq: Once | INTRAMUSCULAR | Status: DC | PRN

## 2019-12-23 MED ORDER — EPINEPHRINE 0.3 MG/0.3ML IJ SOAJ: 0.3000 mg | Freq: Once | INTRAMUSCULAR | Status: DC | PRN

## 2019-12-23 MED ORDER — ALBUTEROL SULFATE HFA 108 (90 BASE) MCG/ACT IN AERS: 2.0000 | INHALATION_SPRAY | Freq: Once | RESPIRATORY_TRACT | Status: DC | PRN

## 2019-12-23 MED ORDER — SODIUM CHLORIDE 0.9 % IV SOLN: 1200.0000 mg | Freq: Once | INTRAVENOUS | Status: DC

## 2019-12-23 NOTE — Discharge Instructions (Signed)
10 Things You Can Do to Manage Your COVID-19 Symptoms at Home If you have possible or confirmed COVID-19: 1. Stay home from work and school. And stay away from other public places. If you must go out, avoid using any kind of public transportation, ridesharing, or taxis. 2. Monitor your symptoms carefully. If your symptoms get worse, call your healthcare provider immediately. 3. Get rest and stay hydrated. 4. If you have a medical appointment, call the healthcare provider ahead of time and tell them that you have or may have COVID-19. 5. For medical emergencies, call 911 and notify the dispatch personnel that you have or may have COVID-19. 6. Cover your cough and sneezes with a tissue or use the inside of your elbow. 7. Wash your hands often with soap and water for at least 20 seconds or clean your hands with an alcohol-based hand sanitizer that contains at least 60% alcohol. 8. As much as possible, stay in a specific room and away from other people in your home. Also, you should use a separate bathroom, if available. If you need to be around other people in or outside of the home, wear a mask. 9. Avoid sharing personal items with other people in your household, like dishes, towels, and bedding. 10. Clean all surfaces that are touched often, like counters, tabletops, and doorknobs. Use household cleaning sprays or wipes according to the label instructions. cdc.gov/coronavirus 07/16/2018 This information is not intended to replace advice given to you by your health care provider. Make sure you discuss any questions you have with your health care provider. Document Revised: 12/18/2018 Document Reviewed: 12/18/2018 Elsevier Patient Education  2020 Elsevier Inc. What types of side effects do monoclonal antibody drugs cause?  Common side effects  In general, the more common side effects caused by monoclonal antibody drugs include: . Allergic reactions, such as hives or itching . Flu-like signs and  symptoms, including chills, fatigue, fever, and muscle aches and pains . Nausea, vomiting . Diarrhea . Skin rashes . Low blood pressure   The CDC is recommending patients who receive monoclonal antibody treatments wait at least 90 days before being vaccinated.  Currently, there are no data on the safety and efficacy of mRNA COVID-19 vaccines in persons who received monoclonal antibodies or convalescent plasma as part of COVID-19 treatment. Based on the estimated half-life of such therapies as well as evidence suggesting that reinfection is uncommon in the 90 days after initial infection, vaccination should be deferred for at least 90 days, as a precautionary measure until additional information becomes available, to avoid interference of the antibody treatment with vaccine-induced immune responses. If you have any questions or concerns after the infusion please call the Advanced Practice Provider on call at [phone]. This number is ONLY intended for your use regarding questions or concerns about the infusion post-treatment side-effects.  Please do not provide this number to others for use. For return to work notes please contact your primary care provider.   If someone you know is interested in receiving treatment please have them call the COVID hotline at [phone].   

## 2019-12-23 NOTE — Progress Notes (Signed)
Patient reviewed Fact Sheet for Patients, Parents, and Caregivers for Emergency Use Authorization (EUA) of REGEN-COV2 for the Treatment of Coronavirus. Patient also reviewed and is agreeable to the estimated cost of treatment. Patient is agreeable to proceed.    

## 2019-12-23 NOTE — Progress Notes (Signed)
  Diagnosis: COVID-19  Physician: Dr. Joya Gaskins  Procedure: [facility] Infusion [facility] Med: casirivimab\imdevimab infusion - Provided patient with casirivimab\imdevimab fact sheet for patients, parents and caregivers prior to infusion.  Complications: No immediate complications noted.  Discharge: Discharged home   Mario Jimenez 12/23/2019

## 2020-02-02 ENCOUNTER — Telehealth: Payer: Self-pay | Admitting: Cardiovascular Disease

## 2020-02-02 NOTE — Telephone Encounter (Signed)
Patient needs the pressure on his cpap turned down. Please advise.

## 2020-02-04 NOTE — Telephone Encounter (Signed)
Message sent to [facility] @ [facility] to contact patient to see if she can assist. Per [facility] the patient's machine is set on auto and he is currently compliant.

## 2020-02-15 ENCOUNTER — Ambulatory Visit: Payer: 59 | Admitting: Internal Medicine

## 2020-02-15 ENCOUNTER — Other Ambulatory Visit: Payer: Self-pay

## 2020-02-15 VITALS — BP 134/84 | HR 83 | Temp 98.4°F | Ht 73.0 in | Wt 283.0 lb

## 2020-02-15 DIAGNOSIS — R030 Elevated blood-pressure reading, without diagnosis of hypertension: Secondary | ICD-10-CM

## 2020-02-15 DIAGNOSIS — M3500 Sicca syndrome, unspecified: Secondary | ICD-10-CM | POA: Diagnosis not present

## 2020-02-15 NOTE — Assessment & Plan Note (Signed)
Sicca syndrome: The patient exhibits symptoms of sicca syndrome since he had COVID in December 2021, the symptoms are listed as probably related to Covid. They should get gradually better over the next few weeks. In the meantime recommend Systane eyedrops consistently, good oral hygiene.  We talk about rx  pilocarpine or similar meds that they have a lot of side effects so we agreed to wait and see. Perioral numbness: Unclear etiology, no other neurological symptoms, rec observation for now Elevated BP: Ambulatory BPs range from 135-1 50.  Today BP is okay.  We agreed on continuous observation. Preventive care: Strongly recommend to proceed with COVID vaccine by mid March. RTC 3 months

## 2020-02-15 NOTE — Patient Instructions (Signed)
Check the  blood pressure 2 or 3 times a week  BP GOAL is between 110/65 and  135/85. If it is consistently higher or lower, let me know  For dry eye use SYSTANE over-the-counter eyedrops, 3-4 times a day as needed   GO TO THE FRONT DESK, PLEASE SCHEDULE YOUR APPOINTMENTS Come back for a checkup in 3 months

## 2020-02-15 NOTE — Progress Notes (Signed)
Subjective:    Patient ID: Mario Jimenez, male    DOB: 1965-03-20, 55 y.o.   MRN: [id number]  DOS:  02/15/2020 Type of visit - description: Acute Patient was DX with COVID in December, had a infusion.  He is here because he has persistent symptoms, he thinks related to Covid: Dry mouth and dry eyes.  Also a metallic taste. Also some days he wakes with perioral numbness.  He uses his CPAP regularly.   Review of Systems Denies fever chills No chest pain, difficulty breathing, cough. No headache, dizziness, diplopia, slurred speech  Past Medical History:  Diagnosis Date  . Allergy   . Asthma mild   no inhaler  . Gallbladder polyp 2018   55mm polyp  . GERD (gastroesophageal reflux disease) occasional   watches diet and takes diet  . Hemorrhoids    h/o Cscope   . History of MRSA infection few yrs ago while in hospital in [place]  . Meniscus tear 8-12   to have surgery-- left knee  . OSA on CPAP   . Urolithiasis 2007-2008   [place]) left kidney  3 surgeries ; has passed stones since then     Past Surgical History:  Procedure Laterality Date  . CYSTOSCOPY/RETROGRADE/URETEROSCOPY/STONE EXTRACTION WITH BASKET  2007-- x2  . KNEE ARTHROSCOPY  12/05/2010   Procedure: ARTHROSCOPY KNEE;  Surgeon: Laurice Record Aplington;  Location: [facility];  Service: Orthopedics;  Laterality: Left;  LEFT KNEE ARTHROSCOPY WITH PARTIAL MEDIAL  MENISECTOMY, Total lateral menisectomy, Shaving of Medial Femoral condyle  . leg surgery  1977- age 63   Left , s/p traction, body cast    Allergies as of 02/15/2020      Reactions   Contrast Media [iodinated Diagnostic Agents] Hives   Ioxaglate Hives   Sulfa Antibiotics Hives      Medication List       Accurate as of February 15, 2020  9:26 PM. If you have any questions, ask your nurse or doctor.        STOP taking these medications   levocetirizine 5 MG tablet Commonly known as: XYZAL Stopped by: Kathlene November, MD     TAKE these  medications   aspirin EC 81 MG tablet Take 81 mg by mouth daily. Swallow whole.   naproxen sodium 220 MG tablet Commonly known as: ALEVE Take 220 mg by mouth 2 (two) times daily as needed (pain).          Objective:   Physical Exam BP 134/84 (BP Location: Left Arm, Patient Position: Sitting, Cuff Size: Large)   Pulse 83   Temp 98.4 F (36.9 C) (Oral)   Ht 6\' 1"  (1.854 m)   Wt 283 lb (128.4 kg)   SpO2 96%   BMI 37.34 kg/m  General:   Well developed, NAD, BMI noted. HEENT:  Normocephalic . Face symmetric, atraumatic. Oral membranes: No lesion, no white patches, perhaps slightly dry. Lungs:  CTA B Normal respiratory effort, no intercostal retractions, no accessory muscle use. Heart: RRR,  no murmur.  Lower extremities: no pretibial edema bilaterally  Skin: Not pale. Not jaundice Neurologic:  alert & oriented X3.  Speech normal, gait appropriate for age and unassisted Psych--  Cognition and judgment appear intact.  Cooperative with normal attention span and concentration.  Behavior appropriate. No anxious or depressed appearing.      Assessment    ASSESSMENT Asthma GERD Chronic sinusitis, Allergies Urolithiasis History of diverticulitis GB polyp, saw surgery/2018, rec a [place] in  6 months. Surgery if polyp >  10 mm Smoker: quit 08/2018 Chronic sinusitis: Seen by ENT 2018, Dr Laurance Flatten, rx medical treatment, no polyps OSA dx 07/2019 Covid infection 12/2019, infusion 12/23/2019  PLAN: Sicca syndrome: The patient exhibits symptoms of sicca syndrome since he had COVID in December 2021, the symptoms are listed as probably related to Covid. They should get gradually better over the next few weeks. In the meantime recommend Systane eyedrops consistently, good oral hygiene.  We talk about rx  pilocarpine or similar meds that they have a lot of side effects so we agreed to wait and see. Perioral numbness: Unclear etiology, no other neurological symptoms, rec observation for  now Elevated BP: Ambulatory BPs range from 135-1 50.  Today BP is okay.  We agreed on continuous observation. Preventive care: Strongly recommend to proceed with COVID vaccine by mid March. RTC 3 months  This visit occurred during the SARS-CoV-2 public health emergency.  Safety protocols were in place, including screening questions prior to the visit, additional usage of staff PPE, and extensive cleaning of exam room while observing appropriate contact time as indicated for disinfecting solutions.

## 2020-02-23 ENCOUNTER — Ambulatory Visit: Payer: 59 | Admitting: Internal Medicine

## 2020-03-28 ENCOUNTER — Ambulatory Visit: Payer: Self-pay | Admitting: Cardiovascular Disease

## 2020-05-16 ENCOUNTER — Encounter: Payer: Self-pay | Admitting: Internal Medicine

## 2020-05-16 ENCOUNTER — Ambulatory Visit: Payer: 59 | Admitting: Internal Medicine

## 2020-05-16 ENCOUNTER — Ambulatory Visit: Payer: 59 | Admitting: Cardiovascular Disease

## 2020-05-16 ENCOUNTER — Other Ambulatory Visit: Payer: Self-pay

## 2020-05-16 ENCOUNTER — Encounter: Payer: Self-pay | Admitting: Cardiovascular Disease

## 2020-05-16 VITALS — BP 118/78 | HR 63 | Ht 73.0 in | Wt 281.8 lb

## 2020-05-16 VITALS — BP 116/74 | HR 88 | Temp 98.2°F | Resp 16 | Ht 73.0 in | Wt 278.5 lb

## 2020-05-16 DIAGNOSIS — Z1159 Encounter for screening for other viral diseases: Secondary | ICD-10-CM | POA: Diagnosis not present

## 2020-05-16 DIAGNOSIS — G4719 Other hypersomnia: Secondary | ICD-10-CM

## 2020-05-16 DIAGNOSIS — G4733 Obstructive sleep apnea (adult) (pediatric): Secondary | ICD-10-CM

## 2020-05-16 DIAGNOSIS — R200 Anesthesia of skin: Secondary | ICD-10-CM | POA: Diagnosis not present

## 2020-05-16 DIAGNOSIS — E668 Other obesity: Secondary | ICD-10-CM

## 2020-05-16 DIAGNOSIS — M3500 Sicca syndrome, unspecified: Secondary | ICD-10-CM | POA: Diagnosis not present

## 2020-05-16 DIAGNOSIS — I10 Essential (primary) hypertension: Secondary | ICD-10-CM | POA: Diagnosis not present

## 2020-05-16 DIAGNOSIS — R0683 Snoring: Secondary | ICD-10-CM

## 2020-05-16 DIAGNOSIS — D72829 Elevated white blood cell count, unspecified: Secondary | ICD-10-CM | POA: Diagnosis not present

## 2020-05-16 NOTE — Progress Notes (Signed)
Subjective:    Patient ID: Mario Jimenez, male    DOB: [birth year], 55 y.o.   MRN: [id number]  DOS:  05/16/2020 Type of visit - description: Follow-up Here for routine checkup, has several concerns: Continue with numbness around the nose and the face after he wakes up whether or not he uses CPAP.  Before he complain of dry mouth and eyes: That seems to be better. Smell sense better Still no taste Ambulatory BPs are slightly elevated. Complain of a very oily skin at the face and scrotum.  BP Readings from Last 3 Encounters:  05/16/20 116/74  05/16/20 118/78  02/15/20 134/84     Review of Systems See above   Past Medical History:  Diagnosis Date  . Allergy   . Asthma mild   no inhaler  . Gallbladder polyp 2018   41mm polyp  . GERD (gastroesophageal reflux disease) occasional   watches diet and takes diet  . Hemorrhoids    h/o Cscope   . History of MRSA infection few yrs ago while in hospital in [place]  . Meniscus tear 8-12   to have surgery-- left knee  . OSA on CPAP   . Urolithiasis 2007-2008   [place] left kidney  3 surgeries ; has passed stones since then     Past Surgical History:  Procedure Laterality Date  . CYSTOSCOPY/RETROGRADE/URETEROSCOPY/STONE EXTRACTION WITH BASKET  2007-- x2  . KNEE ARTHROSCOPY  12/05/2010   Procedure: ARTHROSCOPY KNEE;  Surgeon: Laurice Record Aplington;  Location: [facility];  Service: Orthopedics;  Laterality: Left;  LEFT KNEE ARTHROSCOPY WITH PARTIAL MEDIAL  MENISECTOMY, Total lateral menisectomy, Shaving of Medial Femoral condyle  . leg surgery  1977- age 79   Left , s/p traction, body cast    Allergies as of 05/16/2020      Reactions   Contrast Media [iodinated Diagnostic Agents] Hives   Ioxaglate Hives   Sulfa Antibiotics Hives      Medication List       Accurate as of May 16, 2020  2:23 PM. If you have any questions, ask your nurse or doctor.        aspirin EC 81 MG tablet Take 81 mg by mouth daily. Swallow  whole.   calcium carbonate 1250 (500 Ca) MG tablet Commonly known as: OS-CAL - dosed in mg of elemental calcium Take 1 tablet by mouth.   co-enzyme Q-10 30 MG capsule Take 30 mg by mouth 3 (three) times daily.   multivitamin capsule Take 1 capsule by mouth daily.   naproxen sodium 220 MG tablet Commonly known as: ALEVE Take 220 mg by mouth 2 (two) times daily as needed (pain).   omega-3 acid ethyl esters 1 g capsule Commonly known as: LOVAZA Take by mouth 2 (two) times daily.   vitamin C 1000 MG tablet Take 1,000 mg by mouth daily.   zinc gluconate 50 MG tablet Take 50 mg by mouth daily.          Objective:   Physical Exam BP 116/74 (BP Location: Left Arm, Patient Position: Sitting, Cuff Size: Normal)   Pulse 88   Temp 98.2 F (36.8 C) (Oral)   Resp 16   Ht 6\' 1"  (1.854 m)   Wt 278 lb 8 oz (126.3 kg)   SpO2 97%   BMI 36.74 kg/m  General:   Well developed, NAD, BMI noted. HEENT:  Normocephalic . Face symmetric, atraumatic. Nose: Not congested.  Throat: Symmetric. Lungs:  CTA B Normal respiratory  effort, no intercostal retractions, no accessory muscle use. Heart: RRR,  no murmur.  Lower extremities: no pretibial edema bilaterally  Skin: On the face, groins and GU: Normal Neurologic:  alert & oriented X3.  Speech normal, gait appropriate for age and unassisted Psych--  Cognition and judgment appear intact.  Cooperative with normal attention span and concentration.  Behavior appropriate. No anxious or depressed appearing.      Assessment     ASSESSMENT Asthma GERD Chronic sinusitis, Allergies Urolithiasis History of diverticulitis GB polyp, saw surgery/2018, rec a [place] in 6 months. Surgery if polyp >  10 mm Smoker: quit 08/2018 Chronic sinusitis: Seen by ENT 2018, Dr Laurance Flatten, rx medical treatment, no polyps OSA dx 07/2019 Covid infection 12/2019, infusion 12/23/2019  PLAN: Sicca syndrome: See last visit, symptoms a started after COVID, they are  getting better. Still has some difficulty with decreased taste. Perioral numbness: Continue with numbness around the nose and mouth.  Etiology unclear.  sxs started after COVID, rec  observation Elevated BP?  BPs are still elevated in the ambulatory setting, and at the cardiology visit BP was okay.  Rec to continue checking with a large cuff. Leukocytosis: WBCs has been on and off elevated lately, patient concerned, check a CBC, consider hematology referral Kidney stones: Has a number of questions about kidney stones, they were answered to the best of my ability Abnormal skin?  Reports oily skin, normal to exam today. Preventive care: Recommend to proceed with COVID vaccines.  Hep C screening. RTC 10-2020.      This visit occurred during the SARS-CoV-2 public health emergency.  Safety protocols were in place, including screening questions prior to the visit, additional usage of staff PPE, and extensive cleaning of exam room while observing appropriate contact time as indicated for disinfecting solutions.

## 2020-05-16 NOTE — Patient Instructions (Signed)
Medication Instructions:  No changes  *If you need a refill on your cardiac medications before your next appointment, please call your pharmacy*   Lab Work:  Not needed   Testing/Procedures:  Not needed  Follow-Up: At [facility], you and your health needs are our priority.  As part of our continuing mission to provide you with exceptional heart care, we have created designated Provider Care Teams.  These Care Teams include your primary Cardiologist (physician) and Advanced Practice Providers (APPs -  Physician Assistants and Nurse Practitioners) who all work together to provide you with the care you need, when you need it.     Your next appointment:   12 month(s)-sleep clinic  The format for your next appointment:   In Person  Provider:   Shelva Majestic, MD

## 2020-05-16 NOTE — Patient Instructions (Signed)
Check the  blood pressure with a large cuff  BP GOAL is between 110/65 and  135/85. If it is consistently higher or lower, let me know  Please proceed with your COVID vaccines at your earliest [facility] : Get the blood work     [place], [place] back for a physical exam by October 2022

## 2020-05-16 NOTE — Progress Notes (Signed)
Cardiology Office Note    Date:  05/23/2020   ID:  Mario Jimenez, DOB [birth year], MRN [id number]  PCP:  Colon Branch, MD  Cardiologist:  Shelva Majestic, MD   One year sleep evaluation, initially referred through the courtesy of Dr. Belinda Fisher  History of Present Illness:  Mario Jimenez is a 55 y.o. male who recently was evaluated Dr. Belinda Fisher for primary care.  Due to complaints of significant difficulty with sleep, with significant excessive daytime sleepiness and snoring, he was referred for a new sleep evaluation.  I saw him for initial evaluation on Jun 12, 2019.  He presents for 1 year follow-up assessment.  Mr. Crochet has a longstanding history of tobacco use.  He started smoking at age 29 and quit at age 64.  He resumed smoking at age 58 and quit again at age 50.  He had recently moved from [facility] to [place] and has a new job as a Tour manager.  He previously had been unemployed for a year.  Upon new moving to his to live with a friend's month he admits to very poor sleep.  He does snore.  He has frequent awakenings.  His sleep is nonrestorative.  He wakes up gasping for breath and has nocturia at least 2-3 times per night.  He has significant daytime sleepiness.  Epworth Sleepiness Scale score was calculated in the office today and this endorsed at 14 consistent with significant daytime sleepiness as shown below:  Epworth Sleepiness Scale: Situation   Chance of Dozing/Sleeping (0 = never , 1 = slight chance , 2 = moderate chance , 3 = high chance )   sitting and reading 1   watching TV 2   sitting inactive in a public place 2   being a passenger in a motor vehicle for an hour or more 3   lying down in the afternoon 3   sitting and talking to someone 0   sitting quietly after lunch (no alcohol) 2   while stopped for a few minutes in traffic as the driver 1   Total Score  14   At times he is aware of frequent leg movement with sleep but denies   painful restless legs, bruxism, hypnagogic hallucinations or cataplectic events.  He typically goes to bed at 9 PM and wakes up around 5 AM.  When I initially saw him, I had a long discussion with him and felt that his symptoms were most likely consistent with sleep apnea.  He had significant difficulty sleeping on his back, had a Mallampati scale of 4, and had significant fragmented's sleep, and frequent arousals.  I discussed with him cardiovascular ramifications of untreated sleep apnea as well as its effect on glucose metabolism, GERD, and inflammation.  He was referred for a sleep study which was done on July 26, 2019 was interpreted by Dr. Annamaria Boots.  He had severe obstructive sleep apnea during the diagnostic portion of a split-night protocol with an AHI of 47.6/h.  CPAP was titrated up to 15 cm of water.  He had mild oxygen desaturation to a near nadir of 88%.  He had loud snoring throughout the study.  CPAP set up date was September 28, 2019 with [facility] is his DME company.  A download was obtained from April 13, 2020 through May 12, 2020 which demonstrates 100% compliance with average use at 8 hours and 20 minutes.  His CPAP auto unit is set at  a minimum pressure of 8 with maximum pressure of 16.  His 95th percentile pressure is 11.2 with a maximum average pressure at 12.5.  AHI is excellent at 1.3.  He had used the F&P Simplus large mask at set up and in February 15, 2020 this was changed to a ResMed AirFit F 30 medium mask.  Since initiating therapy, he is sleeping much better and feels much more rested.  He still has nocturia approximately 2 times per night.  History is also notable for mild leg swelling, kidney stones, history of GERD with remote esophagitis, history of diverticulitis, as well as gallbladder polyps.  He had COVID in December 2021 at which time he had antibody infusion and also took steroids.  He presents for evaluation.   Past Medical History:  Diagnosis Date  .  Allergy   . Asthma mild   no inhaler  . Gallbladder polyp 2018   10m polyp  . GERD (gastroesophageal reflux disease) occasional   watches diet and takes diet  . Hemorrhoids    h/o Cscope   . History of MRSA infection [place] ago [place] . Meniscus tear 8-12   to have surgery-- left knee  . OSA on CPAP   . Urolithiasis 2007-2008   ([place]) left kidney  3 surgeries ; has passed stones since then     Past Surgical History:  Procedure Laterality Date  . CYSTOSCOPY/RETROGRADE/URETEROSCOPY/STONE EXTRACTION WITH BASKET  2007-- x2  . KNEE ARTHROSCOPY  12/05/2010   Procedure: ARTHROSCOPY KNEE;  Surgeon: JLaurice RecordAplington;  [place] [facility]  Service: Orthopedics;  Laterality: Left;  LEFT KNEE ARTHROSCOPY WITH PARTIAL MEDIAL  MENISECTOMY, Total lateral menisectomy, Shaving of Medial Femoral condyle  . leg surgery  12 age 55  Left , s/p traction, body cast    Current Medications: Outpatient Medications Prior to Visit  Medication Sig Dispense Refill  . Ascorbic Acid (VITAMIN C) 1000 MG tablet Take 1,000 mg by mouth daily.    .Marland Kitchenaspirin EC 81 MG tablet Take 81 mg by mouth daily. Swallow whole.    . calcium carbonate (OS-CAL - DOSED IN MG OF ELEMENTAL CALCIUM) 1250 (500 Ca) MG tablet Take 1 tablet by mouth.    . co-enzyme Q-10 30 MG capsule Take 30 mg by mouth 3 (three) times daily.    . Multiple Vitamin (MULTIVITAMIN) capsule Take 1 capsule by mouth daily.    . naproxen sodium (ALEVE) 220 MG tablet Take 220 mg by mouth 2 (two) times daily as needed (pain).    .Marland Kitchenomega-3 acid ethyl esters (LOVAZA) 1 g capsule Take by mouth 2 (two) times daily.    .Marland Kitchenzinc gluconate 50 MG tablet Take 50 mg by mouth daily.     No facility-administered medications prior to visit.     Allergies:   Contrast media [iodinated diagnostic agents], Ioxaglate, and Sulfa antibiotics   Social History   Socioeconomic History  . Marital status: Single    Spouse name: Not on  file  . Number of children: 0  . Years of education: Not on file  . Highest education level: Not on file  Occupational History  . 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 Tobacco Use  . Smoking status: Former Smoker    Quit date: 08/2018    Years since quitting: 1.7  . Smokeless tobacco: Never Used  Vaping Use  . Vaping Use: Never used  Substance and Sexual Activity  . Alcohol use: Yes  Comment: social  . Drug use: No  . Sexual activity: Not on file  Other Topics Concern  . Not on file  Social History Narrative   From [place], used to live in [place] x a while, get back to [facility] 2009   lives by himself                Social Determinants of Health   Financial Resource Strain: Not on file  Food Insecurity: Not on file  Transportation Needs: Not on file  Physical Activity: Not on file  Stress: Not on file  Social Connections: Not on file    Social history is notable in that he is single and never married.  He does not have children.  After not being employed for the past year, he commenced working again on May 04, 2019.  He has to wait 90 days before he has insurance through work and this will be [employer].  Family History:  The patient's family history includes Cancer in his mother; Colon cancer in his maternal grandmother; Coronary artery disease in his father; Stroke in his mother.  His mother died at age 65 with a brain tumor.  Father is living at age 57.  He has had history of an MI.  He has a brother age 26 and a sister age 27.  ROS General: Negative; No fevers, chills, or night sweats;  HEENT: Negative; No changes in vision or hearing, sinus congestion, difficulty swallowing Pulmonary: Negative; No cough, wheezing, shortness of breath, hemoptysis Cardiovascular: Negative; No chest pain, presyncope, syncope, palpitations Mild leg swelling GI: History of diverticulitis and diverticulosis; IBS with diarrhea; history of GERD with erosive esophagitis in 2006; history of  gallbladder polyps GU: Positive for kidney stones Musculoskeletal: Negative; no myalgias, joint pain, or weakness Hematologic/Oncology: Negative; no easy bruising, bleeding Endocrine: Negative; no heat/cold intolerance; no diabetes Neuro: Negative; no changes in balance, headaches Skin: Negative; No rashes or skin lesions Psychiatric: Negative; No behavioral problems, depression Sleep: See HPI;  other comprehensive 14 point system review is negative.   PHYSICAL EXAM:   VS:  BP 118/78   Pulse 63   Ht '6\' 1"'  (1.854 m)   Wt 281 lb 12.8 oz (127.8 kg)   BMI 37.18 kg/m     Repeat blood pressure by me was 122/76  Wt Readings from Last 3 Encounters:  05/16/20 278 lb 8 oz (126.3 kg)  05/16/20 281 lb 12.8 oz (127.8 kg)  02/15/20 283 lb (128.4 kg)   General: Alert, oriented, no distress.  Skin: normal turgor, no rashes, warm and dry HEENT: Normocephalic, atraumatic. Pupils equal round and reactive to light; sclera anicteric; extraocular muscles intact; Nose without nasal septal hypertrophy Mouth/Parynx benign; Mallinpatti scale 4 Neck: Thick neck; size 19; no JVD, no carotid bruits; normal carotid upstroke Lungs: clear to ausculatation and percussion; no wheezing or rales Chest wall: without tenderness to palpitation Heart: PMI not displaced, RRR, s1 s2 normal, 1/6 systolic murmur, no diastolic murmur, no rubs, gallops, thrills, or heaves Abdomen: Mild central adiposity; soft, nontender; no hepatosplenomehaly, BS+; abdominal aorta nontender and not dilated by palpation. Back: no CVA tenderness Pulses 2+ Musculoskeletal: full range of motion, normal strength, no joint deformities Extremities: no clubbing cyanosis or edema, Homan's sign negative  Neurologic: grossly nonfocal; Cranial nerves grossly wnl Psychologic: Normal mood and affect   Studies/Labs Reviewed:   EKG:  EKG is  ordered today.  ECG (independently read by me): Sinus rhtyhm at 63; . Mild sinus arrythmia  May 2021 ECG  (independently read by me): NSR at 89; NO significant STT changes  Recent Labs: BMP Latest Ref Rng & Units 10/23/2019 07/14/2019 10/21/2018  Glucose 65 - 99 mg/dL 82 106(H) 88  BUN 7 - 25 mg/dL '11 13 12  ' Creatinine 0.70 - 1.33 mg/dL 0.97 1.04 1.01  BUN/Creat Ratio 6 - 22 (calc) NOT APPLICABLE - -  Sodium 403 - 146 mmol/L 139 138 139  Potassium 3.5 - 5.3 mmol/L 4.5 4.6 4.6  Chloride 98 - 110 mmol/L 103 102 103  CO2 20 - 32 mmol/L '28 26 30  ' Calcium 8.6 - 10.3 mg/dL 9.1 8.9 9.4     Hepatic Function Latest Ref Rng & Units 07/14/2019 10/21/2018 11/14/2016  Total Protein 6.5 - 8.1 g/dL 7.9 7.0 7.1  Albumin 3.5 - 5.0 g/dL 4.3 4.3 4.2  AST 15 - 41 U/L '22 15 16  ' ALT 0 - 44 U/L '19 15 14  ' Alk Phosphatase 38 - 126 U/L 76 78 69  Total Bilirubin 0.3 - 1.2 mg/dL 1.0 0.4 0.6    CBC Latest Ref Rng & Units 05/16/2020 10/23/2019 07/14/2019  WBC 4.0 - 10.5 K/uL 11.7(H) 11.0(H) 14.2(H)  Hemoglobin 13.0 - 17.0 g/dL 14.2 13.5 15.0  Hematocrit 39.0 - 52.0 % 43.0 41.4 45.8  Platelets 150.0 - 400.0 K/uL 204.0 196 211   Lab Results  Component Value Date   MCV 85.5 05/16/2020   MCV 87.9 10/23/2019   MCV 89.5 07/14/2019   Lab Results  Component Value Date   TSH 1.58 11/14/2016   Lab Results  Component Value Date   HGBA1C 5.3 10/23/2019     BNP No results found for: BNP  ProBNP No results found for: PROBNP   Lipid Panel     Component Value Date/Time   CHOL 130 10/23/2019 1545   TRIG 114 10/23/2019 1545   HDL 36 (L) 10/23/2019 1545   CHOLHDL 3.6 10/23/2019 1545   VLDL 31.4 10/21/2018 1538   LDLCALC 74 10/23/2019 1545   LDLDIRECT 81.2 09/21/2010 0813     RADIOLOGY: No results found.   Additional studies/ records that were reviewed today include:  I reviewed the records of Dr. Belinda Fisher.  I also reviewed GI records of Dr. Lyndel Safe.    ASSESSMENT:    1. OSA (obstructive sleep apnea)   2. Essential hypertension   3. Excessive daytime sleepiness   4. Snoring   5. Moderate obesity      PLAN:  Mr.Antolin Chaney is a 55 year-old moderately obese male who has a history of prior tobacco use for approximately 24 years and fortunately quit.  When I initially saw him, he had noticed progressively more abnormal sleep pattern with over the past month he has noticed progressively more abnormal sleep pattern with some insomnia, snoring, awakening gasping for breath, nocturia 2-3 times per night, the need to take frequent daytime naps with excessive daytime sleepiness.   During my patient evaluation his Epworth sleepiness scale score endorsed at 14 consistent with daytime sleepiness.  I during that evaluation I had a long discussion with him and felt his symptoms were highly suspect for sleep apnea.  I reviewed with him cardiovascular ramifications if sleep apnea is left untreated specifically with reference to blood pressure control, nocturnal arrhythmias, potential increased incidence of atrial fibrillation with increased incidence of recurrent AF following successful cardioversion and I also discussed the effects of nocturnal hypoxemia on potential coronary as well as cerebrovascular ischemia.  Patient I discussed its effects on  GERD, glucose metabolism as well as inflammation.  I reviewed his sleep study with him in detail today which confirms severe sleep apnea with an AHI of 47.6.  He was unable to achieve rem sleep on the diagnostic portion of the study.  He has been on CPAP therapy since September 28, 2019.  His download obtained in the office today from April 13, 2020 through May 12, 2020 confirms excellent compliance.  AHI is excellent at 1.3 with his 95th percentile pressure at 11.2 with maximum average pressure 12.5.  As result I will continue him on his current settings with a minimum pressure of 8 and maximum pressure of 16.  He now has a ResMed AirFit F 30 medium size mask which is significantly better than his previous mask.  He notes improved energy.  His blood pressure today is  controlled.  An Epworth Sleepiness Scale score was recalculated in the office today which endorsed at 10, improved from previously.  I discussed the importance of weight loss and increased exercise.  He will be seeing his primary physician, Dr.Paz later today.  I will see him in 1 year for reevaluation.    Medication Adjustments/Labs and Tests Ordered: Current medicines are reviewed at length with the patient today.  Concerns regarding medicines are outlined above.  Medication changes, Labs and Tests ordered today are listed in the Patient Instructions below. Patient Instructions  Medication Instructions:  No changes  *If you need a refill on your cardiac medications before your next appointment, please call your pharmacy*   Lab Work:  Not needed   Testing/Procedures:  Not needed  Follow-Up: At [facility], you and your health needs are our priority.  As part of our continuing mission to provide you with exceptional heart care, we have created designated Provider Care Teams.  These Care Teams include your primary Cardiologist (physician) and Advanced Practice Providers (APPs -  Physician Assistants and Nurse Practitioners) who all work together to provide you with the care you need, when you need it.     Your next appointment:   12 month(s)-sleep clinic  The format for your next appointment:   In Person  Provider:   Shelva Majestic, MD      Signed, Shelva Majestic, MD  05/23/2020 5:42 PM    [facility] [address], [place], [place], [place]  [address] Phone: [phone]

## 2020-05-17 LAB — CBC WITH DIFFERENTIAL/PLATELET
Basophils Absolute: 0.1 10*3/uL (ref 0.0–0.1)
Basophils Relative: 0.6 % (ref 0.0–3.0)
Eosinophils Absolute: 0.2 10*3/uL (ref 0.0–0.7)
Eosinophils Relative: 1.9 % (ref 0.0–5.0)
HCT: 43 % (ref 39.0–52.0)
Hemoglobin: 14.2 g/dL (ref 13.0–17.0)
Lymphocytes Relative: 22.2 % (ref 12.0–46.0)
Lymphs Abs: 2.6 10*3/uL (ref 0.7–4.0)
MCHC: 33.1 g/dL (ref 30.0–36.0)
MCV: 85.5 fl (ref 78.0–100.0)
Monocytes Absolute: 0.7 10*3/uL (ref 0.1–1.0)
Monocytes Relative: 5.8 % (ref 3.0–12.0)
Neutro Abs: 8.1 10*3/uL — ABNORMAL HIGH (ref 1.4–7.7)
Neutrophils Relative %: 69.5 % (ref 43.0–77.0)
Platelets: 204 10*3/uL (ref 150.0–400.0)
RBC: 5.03 Mil/uL (ref 4.22–5.81)
RDW: 14 % (ref 11.5–15.5)
WBC: 11.7 10*3/uL — ABNORMAL HIGH (ref 4.0–10.5)

## 2020-05-17 LAB — HEPATITIS C ANTIBODY
Hepatitis C Ab: NONREACTIVE
SIGNAL TO CUT-OFF: 0.01 (ref <1.00)

## 2020-05-17 NOTE — Assessment & Plan Note (Addendum)
Sicca syndrome: See last visit, symptoms a started after COVID, they are getting better. Still has some difficulty with decreased taste. Perioral numbness: Continue with numbness around the nose and mouth.  Etiology unclear.  sxs started after COVID, rec  observation Elevated BP?  BPs are still elevated in the ambulatory setting, and at the cardiology visit BP was okay.  Rec to continue checking with a large cuff. Leukocytosis: WBCs has been on and off elevated lately, patient concerned, check a CBC, consider hematology referral Kidney stones: Has a number of questions about kidney stones, they were answered to the best of my ability Abnormal skin?  Reports oily skin, normal to exam today. Preventive care: Recommend to proceed with COVID vaccines.  Hep C screening. RTC 10-2020.

## 2020-05-19 NOTE — Addendum Note (Signed)
Addended byDamita Dunnings D on: 05/19/2020 02:12 PM   Modules accepted: Orders

## 2020-05-20 ENCOUNTER — Telehealth: Payer: Self-pay | Admitting: *Deleted

## 2020-05-20 NOTE — Telephone Encounter (Signed)
Per 05/19/20 referral Dr. Larose Kells - called and lvm of upcoming appointments - mailed calendar with welcome packet

## 2020-05-23 ENCOUNTER — Encounter: Payer: Self-pay | Admitting: Cardiovascular Disease

## 2020-05-23 ENCOUNTER — Encounter: Payer: Self-pay | Admitting: Internal Medicine

## 2020-06-03 ENCOUNTER — Other Ambulatory Visit: Payer: Self-pay | Admitting: Family

## 2020-06-03 DIAGNOSIS — D72829 Elevated white blood cell count, unspecified: Secondary | ICD-10-CM

## 2020-06-06 ENCOUNTER — Inpatient Hospital Stay (HOSPITAL_BASED_OUTPATIENT_CLINIC_OR_DEPARTMENT_OTHER): Payer: 59 | Admitting: Family

## 2020-06-06 ENCOUNTER — Other Ambulatory Visit: Payer: Self-pay

## 2020-06-06 ENCOUNTER — Encounter: Payer: Self-pay | Admitting: Family

## 2020-06-06 ENCOUNTER — Ambulatory Visit: Payer: 59 | Admitting: Family

## 2020-06-06 ENCOUNTER — Ambulatory Visit (HOSPITAL_BASED_OUTPATIENT_CLINIC_OR_DEPARTMENT_OTHER)
Admission: RE | Admit: 2020-06-06 | Discharge: 2020-06-06 | Disposition: A | Discharge status: Home / Self Care | Payer: 59 | Source: Ambulatory Visit | Attending: Family | Admitting: Family

## 2020-06-06 ENCOUNTER — Inpatient Hospital Stay: Payer: 59 | Attending: Family

## 2020-06-06 VITALS — BP 114/76 | HR 73 | Temp 98.2°F | Resp 16 | Ht 73.0 in | Wt 275.0 lb

## 2020-06-06 VITALS — BP 120/72 | HR 83 | Temp 98.5°F | Resp 18 | Ht 73.0 in | Wt 283.4 lb

## 2020-06-06 DIAGNOSIS — D72829 Elevated white blood cell count, unspecified: Secondary | ICD-10-CM | POA: Insufficient documentation

## 2020-06-06 DIAGNOSIS — R202 Paresthesia of skin: Secondary | ICD-10-CM | POA: Diagnosis present

## 2020-06-06 DIAGNOSIS — Z8616 Personal history of COVID-19: Secondary | ICD-10-CM | POA: Insufficient documentation

## 2020-06-06 DIAGNOSIS — G629 Polyneuropathy, unspecified: Secondary | ICD-10-CM | POA: Insufficient documentation

## 2020-06-06 DIAGNOSIS — Z87891 Personal history of nicotine dependence: Secondary | ICD-10-CM | POA: Diagnosis not present

## 2020-06-06 DIAGNOSIS — Z800 Family history of malignant neoplasm of digestive organs: Secondary | ICD-10-CM | POA: Insufficient documentation

## 2020-06-06 LAB — CBC WITH DIFFERENTIAL (CANCER CENTER ONLY)
Abs Immature Granulocytes: 0.06 10*3/uL (ref 0.00–0.07)
Basophils Absolute: 0 10*3/uL (ref 0.0–0.1)
Basophils Relative: 0 %
Eosinophils Absolute: 0.4 10*3/uL (ref 0.0–0.5)
Eosinophils Relative: 6 %
HCT: 44.3 % (ref 39.0–52.0)
Hemoglobin: 14.4 g/dL (ref 13.0–17.0)
Immature Granulocytes: 1 %
Lymphocytes Relative: 25 %
Lymphs Abs: 1.7 10*3/uL (ref 0.7–4.0)
MCH: 28.6 pg (ref 26.0–34.0)
MCHC: 32.5 g/dL (ref 30.0–36.0)
MCV: 88.1 fL (ref 80.0–100.0)
Monocytes Absolute: 0.4 10*3/uL (ref 0.1–1.0)
Monocytes Relative: 6 %
Neutro Abs: 4.3 10*3/uL (ref 1.7–7.7)
Neutrophils Relative %: 62 %
Platelet Count: 173 10*3/uL (ref 150–400)
RBC: 5.03 MIL/uL (ref 4.22–5.81)
RDW: 13.7 % (ref 11.5–15.5)
WBC Count: 6.9 10*3/uL (ref 4.0–10.5)
nRBC: 0 % (ref 0.0–0.2)

## 2020-06-06 LAB — CMP (CANCER CENTER ONLY)
ALT: 18 U/L (ref 0–44)
AST: 21 U/L (ref 15–41)
Albumin: 4.2 g/dL (ref 3.5–5.0)
Alkaline Phosphatase: 63 U/L (ref 38–126)
Anion gap: 6 (ref 5–15)
BUN: 13 mg/dL (ref 6–20)
CO2: 33 mmol/L — ABNORMAL HIGH (ref 22–32)
Calcium: 9.8 mg/dL (ref 8.9–10.3)
Chloride: 99 mmol/L (ref 98–111)
Creatinine: 1.18 mg/dL (ref 0.61–1.24)
GFR, Estimated: >60 mL/min (ref >60)
Glucose, Bld: 151 mg/dL — ABNORMAL HIGH (ref 70–99)
Potassium: 4.1 mmol/L (ref 3.5–5.1)
Sodium: 138 mmol/L (ref 135–145)
Total Bilirubin: 0.6 mg/dL (ref 0.3–1.2)
Total Protein: 7.3 g/dL (ref 6.5–8.1)

## 2020-06-06 LAB — B12 AND FOLATE PANEL
Folate: >24.4 ng/mL (ref >5.9)
Vitamin B-12: >1550 pg/mL — ABNORMAL HIGH (ref 211–911)

## 2020-06-06 LAB — LACTATE DEHYDROGENASE: LDH: 157 U/L (ref 98–192)

## 2020-06-06 LAB — SAVE SMEAR(SSMR), FOR PROVIDER SLIDE REVIEW

## 2020-06-06 NOTE — Patient Instructions (Signed)
Please complete lab work prior to leaving. Complete x-ray on the first floor. You should be contacted about your referral to neurology. Please call if new/worsening symptoms.

## 2020-06-06 NOTE — Assessment & Plan Note (Signed)
Will obtain b12/folate.  Obtain x-ray of the lumbar spine to evaluate for lumbar disc disease. Refer to neurology for further evaluation.

## 2020-06-06 NOTE — Progress Notes (Signed)
Hematology/Oncology Consultation   Name: Mario Jimenez      MRN: [id number]    Location: Room/bed info not found  Date: 06/06/2020 Time:3:15 PM   REFERRING PHYSICIAN: Kathlene November, MD  REASON FOR CONSULT: Leukocytosis    DIAGNOSIS: Intermittent mild leukocytosis   HISTORY OF PRESENT ILLNESS: Mario Jimenez is a very pleasant 55 yo gentleman with 4 year history of intermittent mild leukocytosis.  He denies any issue with recurrent or frequent infections.  WBC count today is 6.9, Hgb 14.4, MCV 88 and platelets 173.  Neutrophils 62% and lymphocytes 25%.  He had Covid in early December 2021. He states that he still has some fatigue and that the ring finger on his left hand pops in and out of socket since that time.  He states that he is under a lot of stress. His father is in the ICU and not doing well and he is also working 2 jobs in Architect.   He is followed by Dr. Felipa Eth for kidney stones.   He has had numbness and tingling in his feet as well as tingling in his face with his CPAP mask. He has been referred to neurology for further work up.  No personal history of cancer. His mother had an inoperable brain tumor and grandparents on both sides of the family had history of colon cancer.  He had his most recent colonoscopy in July 2019. He had 1 benign polyp removed from the rectum, moderate sigmoid diverticulosis with mild associated colitis likely SCAD (biopsied, benign) and internal and external hemorrhoids (Grade IV). He has occasional scant blood on his toilet tissue with straining and hemorrhoid. No other blood loss noted.  No bruising or petechiae.  No history of diabetes or thyroid disease.   No fever, chills, n/v, cough, rash, dizziness, SOB, chest pain, palpitations, abdominal pain or changes in bowel or bladder habits.  No swelling or tenderness in his extremities. He had swelling in both legs but this resolved after he discontinued Norvasc.  No falls or syncope.  He has maintained a  good appetite and is staying well hydrated. His weight is stable at 283 lbs.   ROS: All other 10 point review of systems is negative.   PAST MEDICAL HISTORY:   Past Medical History:  Diagnosis Date  . Allergy   . Asthma mild   no inhaler  . Gallbladder polyp 2018   34mm polyp  . GERD (gastroesophageal reflux disease) occasional   watches diet and takes diet  . Hemorrhoids    h/o Cscope   . History of MRSA infection few yrs ago while in hospital in [place]  . Meniscus tear 8-12   to have surgery-- left knee  . OSA on CPAP   . Urolithiasis 2007-2008   ([place]) left kidney  3 surgeries ; has passed stones since then     ALLERGIES: Allergies  Allergen Reactions  . Contrast Media [Iodinated Diagnostic Agents] Hives  . Ioxaglate Hives  . Sulfa Antibiotics Hives      MEDICATIONS:  Current Outpatient Medications on File Prior to Visit  Medication Sig Dispense Refill  . Ascorbic Acid (VITAMIN C) 1000 MG tablet Take 1,000 mg by mouth daily.    Marland Kitchen aspirin EC 81 MG tablet Take 81 mg by mouth daily. Swallow whole.    . calcium carbonate (OS-CAL - DOSED IN MG OF ELEMENTAL CALCIUM) 1250 (500 Ca) MG tablet Take 1 tablet by mouth.    . co-enzyme Q-10 30 MG capsule Take 30  mg by mouth 3 (three) times daily.    . Multiple Vitamin (MULTIVITAMIN) capsule Take 1 capsule by mouth daily.    . naproxen sodium (ALEVE) 220 MG tablet Take 220 mg by mouth 2 (two) times daily as needed (pain).    Marland Kitchen omega-3 acid ethyl esters (LOVAZA) 1 g capsule Take by mouth 2 (two) times daily.    Marland Kitchen zinc gluconate 50 MG tablet Take 50 mg by mouth daily.     No current facility-administered medications on file prior to visit.     PAST SURGICAL HISTORY Past Surgical History:  Procedure Laterality Date  . CYSTOSCOPY/RETROGRADE/URETEROSCOPY/STONE EXTRACTION WITH BASKET  2007-- x2  . KNEE ARTHROSCOPY  12/05/2010   Procedure: ARTHROSCOPY KNEE;  Surgeon: Laurice Record Aplington;  Location: [facility];  Service:  Orthopedics;  Laterality: Left;  LEFT KNEE ARTHROSCOPY WITH PARTIAL MEDIAL  MENISECTOMY, Total lateral menisectomy, Shaving of Medial Femoral condyle  . leg surgery  69- age 29   Left , s/p traction, body cast    FAMILY HISTORY: Family History  Problem Relation Age of Onset  . Coronary artery disease Father        F, MI 67 y/o  . Stroke Mother        M d/t brain tumor   . Cancer Mother        M brain tumor  . Colon cancer Maternal Grandmother   . Diabetes Neg Hx   . Prostate cancer Neg Hx   . Esophageal cancer Neg Hx     SOCIAL HISTORY:  reports that he quit smoking about 21 months ago. He has never used smokeless tobacco. He reports current alcohol use. He reports that he does not use drugs.  PERFORMANCE STATUS: The patient's performance status is 0 - Asymptomatic  PHYSICAL EXAM: Most Recent Vital Signs: There were no vitals taken for this visit. BP 120/72 (BP Location: Left Arm, Patient Position: Sitting)   Pulse 83   Temp 98.5 F (36.9 C) (Oral)   Resp 18   Ht 6\' 1"  (1.854 m)   Wt 283 lb 6.4 oz (128.5 kg)   SpO2 99%   BMI 37.39 kg/m   General Appearance:    Alert, cooperative, no distress, appears stated age  Head:    Normocephalic, without obvious abnormality, atraumatic  Eyes:    PERRL, conjunctiva/corneas clear, EOM's intact, fundi    benign, both eyes             Throat:   Lips, mucosa, and tongue normal; teeth and gums normal  Neck:   Supple, symmetrical, trachea midline, no adenopathy;       thyroid:  No enlargement/tenderness/nodules; no carotid   bruit or JVD  Back:     Symmetric, no curvature, ROM normal, no CVA tenderness  Lungs:     Clear to auscultation bilaterally, respirations unlabored  Chest wall:    No tenderness or deformity  Heart:    Regular rate and rhythm, S1 and S2 normal, no murmur, rub   or gallop  Abdomen:     Soft, non-tender, bowel sounds active all four quadrants,    no masses, no organomegaly        Extremities:   Extremities  normal, atraumatic, no cyanosis or edema  Pulses:   2+ and symmetric all extremities  Skin:   Skin color, texture, turgor normal, no rashes or lesions  Lymph nodes:   Cervical, supraclavicular, and axillary nodes normal  Neurologic:   CNII-XII intact. Normal strength,  sensation and reflexes      throughout    LABORATORY DATA:  Results for orders placed or performed in visit on 06/06/20 (from the past 48 hour(s))  CBC with Differential ([facility] Only)     Status: None   Collection Time: 06/06/20  2:43 PM  Result Value Ref Range   WBC Count 6.9 4.0 - 10.5 K/uL   RBC 5.03 4.22 - 5.81 MIL/uL   Hemoglobin 14.4 13.0 - 17.0 g/dL   HCT 44.3 39.0 - 52.0 %   MCV 88.1 80.0 - 100.0 fL   MCH 28.6 26.0 - 34.0 pg   MCHC 32.5 30.0 - 36.0 g/dL   RDW 13.7 11.5 - 15.5 %   Platelet Count 173 150 - 400 K/uL   nRBC 0.0 0.0 - 0.2 %   Neutrophils Relative % 62 %   Neutro Abs 4.3 1.7 - 7.7 K/uL   Lymphocytes Relative 25 %   Lymphs Abs 1.7 0.7 - 4.0 K/uL   Monocytes Relative 6 %   Monocytes Absolute 0.4 0.1 - 1.0 K/uL   Eosinophils Relative 6 %   Eosinophils Absolute 0.4 0.0 - 0.5 K/uL   Basophils Relative 0 %   Basophils Absolute 0.0 0.0 - 0.1 K/uL   Immature Granulocytes 1 %   Abs Immature Granulocytes 0.06 0.00 - 0.07 K/uL    Comment: Performed at [facility] Lab at [facility], [address], [place], [place] [address]  Save Smear [facility])     Status: None   Collection Time: 06/06/20  2:43 PM  Result Value Ref Range   Smear Review SMEAR STAINED AND AVAILABLE FOR REVIEW     Comment: Performed at [facility] Lab at [facility], [address], [place], [place] [address]      RADIOGRAPHY: No results found.     PATHOLOGY: None  ASSESSMENT/PLAN: Mr. Hehl is a very pleasant 55 yo gentleman with 4 year history of intermittent mild leukocytosis.  His counts today are stable. Reviewed lab work and blood smear with Dr. Marin Olp. No abnormality or  evidence of malignancy noted.  At this time we will release him back to his PCP. No follow-up needed.   All questions were answered. The patient knows to call the clinic with any problems, questions or concerns. We would gladly see the patient again for any future heme/onc needs.  The patient was discussed with Dr. Marin Olp and he is in agreement with the aforementioned.   Laverna Peace, NP

## 2020-06-06 NOTE — Progress Notes (Signed)
Subjective:   By signing my name below, I, Mario Jimenez, attest that this documentation has been prepared under the direction and in the presence of Debbrah Alar NP. 06/06/2020      Patient ID: Mario Jimenez, male    DOB: 19-Sep-1965, 55 y.o.   MRN: [id number]  No chief complaint on file.   HPI   Patient is in today complaining of bilateral feet numbness. Since last winter, his toes and heels have felt numbness and tingling and have worsened since then. He notes that at this time both his feet feel numb. He reports that while he had Covid-19 he had numbness on his face after not wear his CPAP machine while sleeping.He takes quick dissolve B12 supplements. He reports that his last lab showed that his white blood cells were too high. He is meeting with hematology later today. He has no personal history or diabetes.  He also mentions that couple of weeks ago he had sharp pain across his upper back while laying down in his hotel bed which resolved on its own. He denies having any back pain at this time.    Past Medical History:  Diagnosis Date  . Allergy   . Asthma mild   no inhaler  . Gallbladder polyp 2018   26mm polyp  . GERD (gastroesophageal reflux disease) occasional   watches diet and takes diet  . Hemorrhoids    h/o Cscope   . History of MRSA infection few yrs ago while in hospital in [place]  . Meniscus tear 8-12   to have surgery-- left knee  . OSA on CPAP   . Urolithiasis 2007-2008   ([place]) left kidney  3 surgeries ; has passed stones since then     Past Surgical History:  Procedure Laterality Date  . CYSTOSCOPY/RETROGRADE/URETEROSCOPY/STONE EXTRACTION WITH BASKET  2007-- x2  . KNEE ARTHROSCOPY  12/05/2010   Procedure: ARTHROSCOPY KNEE;  Surgeon: Laurice Record Aplington;  Location: [facility];  Service: Orthopedics;  Laterality: Left;  LEFT KNEE ARTHROSCOPY WITH PARTIAL MEDIAL  MENISECTOMY, Total lateral menisectomy, Shaving of Medial Femoral condyle  .  leg surgery  51- age 20   Left , s/p traction, body cast    Family History  Problem Relation Age of Onset  . Coronary artery disease Father        F, MI 59 y/o  . Stroke Mother        M d/t brain tumor   . Cancer Mother        M brain tumor  . Colon cancer Maternal Grandmother   . Diabetes Neg Hx   . Prostate cancer Neg Hx   . Esophageal cancer Neg Hx     Social History   Socioeconomic History  . Marital status: Single    Spouse name: Not on file  . Number of children: 0  . Years of education: Not on file  . Highest education level: Not on file  Occupational History  . Occupation: work Architect  Tobacco Use  . Smoking status: Former Smoker    Quit date: 08/2018    Years since quitting: 1.8  . Smokeless tobacco: Never Used  Vaping Use  . Vaping Use: Never used  Substance and Sexual Activity  . Alcohol use: Yes    Comment: social  . Drug use: No  . Sexual activity: Not on file  Other Topics Concern  . Not on file  Social History Narrative   From [place], used to live  in [place] x a while, get back to [place] 2009   lives by himself                Social Determinants of Health   Financial Resource Strain: Not on file  Food Insecurity: Not on file  Transportation Needs: Not on file  Physical Activity: Not on file  Stress: Not on file  Social Connections: Not on file  Intimate Partner Violence: Not on file    Outpatient Medications Prior to Visit  Medication Sig Dispense Refill  . Ascorbic Acid (VITAMIN C) 1000 MG tablet Take 1,000 mg by mouth daily.    Marland Kitchen aspirin EC 81 MG tablet Take 81 mg by mouth daily. Swallow whole.    . calcium carbonate (OS-CAL - DOSED IN MG OF ELEMENTAL CALCIUM) 1250 (500 Ca) MG tablet Take 1 tablet by mouth.    . co-enzyme Q-10 30 MG capsule Take 30 mg by mouth 3 (three) times daily.    . Multiple Vitamin (MULTIVITAMIN) capsule Take 1 capsule by mouth daily.    . naproxen sodium (ALEVE) 220 MG tablet Take 220 mg by mouth 2 (two) times  daily as needed (pain).    Marland Kitchen omega-3 acid ethyl esters (LOVAZA) 1 g capsule Take by mouth 2 (two) times daily.    Marland Kitchen zinc gluconate 50 MG tablet Take 50 mg by mouth daily.     No facility-administered medications prior to visit.    Allergies  Allergen Reactions  . Contrast Media [Iodinated Diagnostic Agents] Hives  . Ioxaglate Hives  . Sulfa Antibiotics Hives    Review of Systems  Musculoskeletal: Positive for back pain.  Neurological: Positive for tingling.       Objective:    Physical Exam Constitutional:      Appearance: Normal appearance.  HENT:     Head: Normocephalic and atraumatic.     Right Ear: External ear normal.     Left Ear: External ear normal.  Eyes:     Extraocular Movements: Extraocular movements intact.     Pupils: Pupils are equal, round, and reactive to light.  Cardiovascular:     Rate and Rhythm: Normal rate and regular rhythm.     Pulses: Normal pulses.          Dorsalis pedis pulses are 2+ on the right side and 2+ on the left side.       Posterior tibial pulses are 2+ on the right side and 2+ on the left side.     Heart sounds: Normal heart sounds. No murmur heard. No gallop.   Pulmonary:     Effort: Pulmonary effort is normal. No respiratory distress.     Breath sounds: Normal breath sounds. No wheezing, rhonchi or rales.  Skin:    General: Skin is warm and dry.  Neurological:     Mental Status: He is alert and oriented to person, place, and time.     Comments: + facial symmetry + sensation equal bilaterally on face Both feet noted to have intact sensation to monofilament.    Psychiatric:        Behavior: Behavior normal.     There were no vitals taken for this visit. Wt Readings from Last 3 Encounters:  05/16/20 278 lb 8 oz (126.3 kg)  05/16/20 281 lb 12.8 oz (127.8 kg)  02/15/20 283 lb (128.4 kg)    Diabetic Foot Exam - Simple   No data filed    Lab Results  Component Value Date   WBC  11.7 (H) 05/16/2020   HGB 14.2  05/16/2020   HCT 43.0 05/16/2020   PLT 204.0 05/16/2020   GLUCOSE 82 10/23/2019   CHOL 130 10/23/2019   TRIG 114 10/23/2019   HDL 36 (L) 10/23/2019   LDLDIRECT 81.2 09/21/2010   LDLCALC 74 10/23/2019   ALT 19 07/14/2019   AST 22 07/14/2019   NA 139 10/23/2019   K 4.5 10/23/2019   CL 103 10/23/2019   CREATININE 0.97 10/23/2019   BUN 11 10/23/2019   CO2 28 10/23/2019   TSH 1.58 11/14/2016   PSA 1.06 10/21/2018   HGBA1C 5.3 10/23/2019    Lab Results  Component Value Date   TSH 1.58 11/14/2016   Lab Results  Component Value Date   WBC 11.7 (H) 05/16/2020   HGB 14.2 05/16/2020   HCT 43.0 05/16/2020   MCV 85.5 05/16/2020   PLT 204.0 05/16/2020   Lab Results  Component Value Date   NA 139 10/23/2019   K 4.5 10/23/2019   CO2 28 10/23/2019   GLUCOSE 82 10/23/2019   BUN 11 10/23/2019   CREATININE 0.97 10/23/2019   BILITOT 1.0 07/14/2019   ALKPHOS 76 07/14/2019   AST 22 07/14/2019   ALT 19 07/14/2019   PROT 7.9 07/14/2019   ALBUMIN 4.3 07/14/2019   CALCIUM 9.1 10/23/2019   ANIONGAP 10 07/14/2019   GFR 77.87 10/21/2018   Lab Results  Component Value Date   CHOL 130 10/23/2019   Lab Results  Component Value Date   HDL 36 (L) 10/23/2019   Lab Results  Component Value Date   LDLCALC 74 10/23/2019   Lab Results  Component Value Date   TRIG 114 10/23/2019   Lab Results  Component Value Date   CHOLHDL 3.6 10/23/2019   Lab Results  Component Value Date   HGBA1C 5.3 10/23/2019       Assessment & Plan:   Problem List Items Addressed This Visit   None      No orders of the defined types were placed in this encounter.   I, Debbrah Alar NP, personally preformed the services described in this documentation.  All medical record entries made by the scribe were at my direction and in my presence.  I have reviewed the chart and discharge instructions (if applicable) and agree that the record reflects my personal performance and is accurate and  complete. 06/06/2020   I,Mario Jimenez,acting as a Education administrator for Nance Pear, NP.,have documented all relevant documentation on the behalf of Nance Pear, NP,as directed by  Nance Pear, NP while in the presence of Nance Pear, NP.   Mario [employer]

## 2020-07-25 ENCOUNTER — Ambulatory Visit: Payer: 59 | Admitting: Internal Medicine

## 2020-07-28 ENCOUNTER — Telehealth: Payer: Self-pay

## 2020-07-28 NOTE — Telephone Encounter (Signed)
Caller states is needing to postpone his Dr's appt on Friday.  Telephone: [phone]

## 2020-07-28 NOTE — Telephone Encounter (Signed)
Appt rescheduled

## 2020-07-29 ENCOUNTER — Ambulatory Visit: Payer: 59 | Admitting: Internal Medicine

## 2020-09-05 ENCOUNTER — Other Ambulatory Visit: Payer: Self-pay

## 2020-09-05 ENCOUNTER — Encounter: Payer: Self-pay | Admitting: Diagnostic Neuroimaging

## 2020-09-05 ENCOUNTER — Ambulatory Visit: Payer: 59 | Admitting: Diagnostic Neuroimaging

## 2020-09-05 VITALS — BP 122/80 | HR 93 | Ht 73.0 in | Wt 292.0 lb

## 2020-09-05 DIAGNOSIS — R200 Anesthesia of skin: Secondary | ICD-10-CM | POA: Diagnosis not present

## 2020-09-05 DIAGNOSIS — G629 Polyneuropathy, unspecified: Secondary | ICD-10-CM

## 2020-09-05 DIAGNOSIS — E539 Vitamin B deficiency, unspecified: Secondary | ICD-10-CM

## 2020-09-05 NOTE — Patient Instructions (Signed)
BURNING / NUMB FEET (since Dec 2021) - check neuropathy labs - may consider gabapentin in future  INTERMITTENT FACIAL NUMBNESS (when waking up from nap / sleep) - improving; could be related to hypoventilation / hyperventilation

## 2020-09-05 NOTE — Progress Notes (Signed)
[facility]  PATIENT: Mario Jimenez DOB: 1965/03/27  REFERRING CLINICIAN: Colon Branch, MD HISTORY FROM: patient  REASON FOR VISIT: new consult    HISTORICAL  CHIEF COMPLAINT:  Chief Complaint  Patient presents with   New Patient (Initial Visit)    Rm 6 alone here for consult on worsening neuropathy sx. Reports sx started last winter after being dx with covid. Sx are primarily in bilateral feet. Also reports sx are located in his face from time to time.     HISTORY OF PRESENT ILLNESS:   55 year old male here for evaluation of lower extremity numbness and tingling.  Patient was diagnosed with sleep apnea and started on CPAP in September 2021.  He was doing well until December 2021 when he developed COVID.  He then developed symptoms dry mouth, dry eyes, tingling in the feet and face.  Numbness and tingling in feet has progressed to burning sensation.  Symptoms worsening over time.  Patient was having intermittent numbness and tingling of the face and mouth, typically when waking up from sleep, especially if he did not get a chance to use his CPAP.  These facial paresthesias have improved over time.  His dry mouth, taste difficulty and dry eyes have also improved.  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  He is changed these to a new type of boot with custom fitting and soft insoles, but symptoms in his feet seem to be worse after he stands on his feet for a long time.  Patient does have some chronic low back pain which is mild and not worsening.  Also has some degenerative changes in his neck with sandpaper sensation when he turns his head side to side.  No problems with his fingers or hands.   REVIEW OF SYSTEMS: Full 14 system review of systems performed and negative with exception of: as pre HPI.  ALLERGIES: Allergies  Allergen Reactions   Contrast Media [Iodinated Diagnostic Agents] Hives   Ioxaglate  Hives   Sulfa Antibiotics Hives    HOME MEDICATIONS: Outpatient Medications Prior to Visit  Medication Sig Dispense Refill   Ascorbic Acid (VITAMIN C) 1000 MG tablet Take 1,000 mg by mouth daily.     aspirin EC 81 MG tablet Take 81 mg by mouth daily. Swallow whole.     calcium carbonate (OS-CAL - DOSED IN MG OF ELEMENTAL CALCIUM) 1250 (500 Ca) MG tablet Take 1 tablet by mouth.     co-enzyme Q-10 30 MG capsule Take 30 mg by mouth 3 (three) times daily.     Multiple Vitamin (MULTIVITAMIN) capsule Take 1 capsule by mouth daily.     naproxen sodium (ALEVE) 220 MG tablet Take 220 mg by mouth 2 (two) times daily as needed (pain).     omega-3 acid ethyl esters (LOVAZA) 1 g capsule Take by mouth 2 (two) times daily.     zinc gluconate 50 MG tablet Take 50 mg by mouth daily.     No facility-administered medications prior to visit.    PAST MEDICAL HISTORY: Past Medical History:  Diagnosis Date   Allergy    Asthma mild   no inhaler   Gallbladder polyp 2018   44m polyp   GERD (gastroesophageal reflux disease) occasional   watches diet and takes diet   Hemorrhoids    h/o Cscope    History of MRSA infection few yrs ago while in hospital in [place]   Meniscus  tear 8-12   to have surgery-- left knee   OSA on CPAP    Urolithiasis 2007[place]   [place]  3 surgeries ; has passed stones since then     PAST SURGICAL HISTORY: Past Surgical History:  Procedure Laterality Date   CYSTOSCOPY/RETROGRADE/URETEROSCOPY/STONE EXTRACTION WITH BASKET  2007-- x2   KNEE ARTHROSCOPY  12/05/2010   Procedure: ARTHROSCOPY KNEE;  Surgeon: Laurice Record Aplington;  Location: [facility];  Service: Orthopedics;  Laterality: Left;  LEFT KNEE ARTHROSCOPY WITH PARTIAL MEDIAL  MENISECTOMY, Total lateral menisectomy, Shaving of Medial Femoral condyle   leg surgery  1977- age 82   Left , s/p traction, body cast    FAMILY HISTORY: Family History  Problem Relation Age of Onset   Coronary artery  disease Father        F, MI 86 y/o   Stroke Mother        M d/t brain tumor    Cancer Mother        M brain tumor   Colon cancer Maternal Grandmother    Diabetes Neg Hx    Prostate cancer Neg Hx    Esophageal cancer Neg Hx     SOCIAL HISTORY: Social History   Socioeconomic History   Marital status: Single    Spouse name: Not on file   Number of children: 0   Years of education: Not on file   Highest education level: Not on file  Occupational History   Occupation: work Architect  Tobacco Use   Smoking status: Former    Types: Cigarettes    Quit date: 08/2018    Years since quitting: 2.0   Smokeless tobacco: Never  Vaping Use   Vaping Use: Never used  Substance and Sexual Activity   Alcohol use: Not Currently    Comment: None Since 2017 MB RN   Drug use: No   Sexual activity: Not on file  Other Topics Concern   Not on file  Social History Narrative   From [place], used to live in [place] x a while, get back to [facility] 2009   Lives by himself    Right handed    Caffeine- 32 oz per day    Social Determinants of Health   Financial Resource Strain: Not on file  Food Insecurity: Not on file  Transportation Needs: Not on file  Physical Activity: Not on file  Stress: Not on file  Social Connections: Not on file  Intimate Partner Violence: Not on file     PHYSICAL EXAM  GENERAL EXAM/CONSTITUTIONAL: Vitals:  Vitals:   09/05/20 1028  BP: 122/80  Pulse: 93  SpO2: 95%  Weight: 292 lb (132.5 kg)  Height: '6\' 1"'$  (1.854 m)   Body mass index is 38.52 kg/m. Wt Readings from Last 3 Encounters:  09/05/20 292 lb (132.5 kg)  06/06/20 283 lb 6.4 oz (128.5 kg)  06/06/20 275 lb (124.7 kg)   Patient is in no distress; well developed, nourished and groomed; neck is supple  CARDIOVASCULAR: Examination of carotid arteries is normal; no carotid bruits Regular rate and rhythm, no murmurs Examination of peripheral vascular system by observation and palpation is  normal  EYES: Ophthalmoscopic exam of optic discs and posterior segments is normal; no papilledema or hemorrhages No results found.  MUSCULOSKELETAL: Gait, strength, tone, movements noted in Neurologic exam below  NEUROLOGIC: MENTAL STATUS:  No flowsheet data found. awake, alert, oriented to person, place and time recent and remote memory intact normal attention and  concentration language fluent, comprehension intact, naming intact fund of knowledge appropriate  CRANIAL NERVE:  2nd - no papilledema on fundoscopic exam 2nd, 3rd, 4th, 6th - pupils equal and reactive to light, visual fields full to confrontation, extraocular muscles intact, no nystagmus 5th - facial sensation symmetric 7th - facial strength symmetric 8th - hearing intact 9th - palate elevates symmetrically, uvula midline 11th - shoulder shrug symmetric 12th - tongue protrusion midline  MOTOR:  normal bulk and tone, full strength in the BUE, BLE  SENSORY:  normal and symmetric to light touch, pinprick, temperature, vibration  COORDINATION:  finger-nose-finger, fine finger movements normal  REFLEXES:  deep tendon reflexes present and symmetric  GAIT/STATION:  narrow based gait; able to walk on toes, heels and tandem; romberg is negative     DIAGNOSTIC DATA (LABS, IMAGING, TESTING) - I reviewed patient records, labs, notes, testing and imaging myself where available.  Lab Results  Component Value Date   WBC 6.9 06/06/2020   HGB 14.4 06/06/2020   HCT 44.3 06/06/2020   MCV 88.1 06/06/2020   PLT 173 06/06/2020      Component Value Date/Time   NA 138 06/06/2020 1443   K 4.1 06/06/2020 1443   CL 99 06/06/2020 1443   CO2 33 (H) 06/06/2020 1443   GLUCOSE 151 (H) 06/06/2020 1443   BUN 13 06/06/2020 1443   CREATININE 1.18 06/06/2020 1443   CREATININE 0.97 10/23/2019 1545   CALCIUM 9.8 06/06/2020 1443   PROT 7.3 06/06/2020 1443   ALBUMIN 4.2 06/06/2020 1443   AST 21 06/06/2020 1443   ALT 18  06/06/2020 1443   ALKPHOS 63 06/06/2020 1443   BILITOT 0.6 06/06/2020 1443   GFRNONAA >60 06/06/2020 1443   GFRAA >60 07/14/2019 1816   Lab Results  Component Value Date   CHOL 130 10/23/2019   HDL 36 (L) 10/23/2019   LDLCALC 74 10/23/2019   LDLDIRECT 81.2 09/21/2010   TRIG 114 10/23/2019   CHOLHDL 3.6 10/23/2019   Lab Results  Component Value Date   HGBA1C 5.3 10/23/2019   Lab Results  Component Value Date   VITAMINB12 >1550 (H) 06/06/2020   Lab Results  Component Value Date   TSH 1.58 11/14/2016       ASSESSMENT AND PLAN  55 y.o. year old male here with numbness and tingling in feet since December 2021, around the time of COVID infection.  Could be related to post-COVID phenomenon/neuropathy.  We will proceed with further work-up to rule out other cause of neuropathy.   Dx:  1. Burning feet syndrome   2. Numbness in feet   3. Neuropathy       PLAN:  BURNING / NUMB FEET (since Dec 2021) - check neuropathy labs - may consider gabapentin in future - may consider MRI lumbar spine and EMG nerve conduction study in future  INTERMITTENT FACIAL NUMBNESS (when waking up from nap / sleep) - improving; could be related to hypoventilation / hyperventilation  Orders Placed This Encounter  Procedures   A1c   TSH   SPEP with IFE   ANA w/Reflex   SSA, SSB   HIV   RPR   Return for pending test results, pending if symptoms worsen or fail to improve.    Penni Bombard, MD [id number], [id number] AM Certified in Neurology, Neurophysiology and Neuroimaging  [facility] [address] [place], [place] [address] [phone]

## 2020-09-08 LAB — MULTIPLE MYELOMA PANEL, SERUM
Albumin SerPl Elph-Mcnc: 3.8 g/dL (ref 2.9–4.4)
Albumin/Glob SerPl: 1.2 (ref 0.7–1.7)
Alpha 1: 0.3 g/dL (ref 0.0–0.4)
Alpha2 Glob SerPl Elph-Mcnc: 0.8 g/dL (ref 0.4–1.0)
B-Globulin SerPl Elph-Mcnc: 1 g/dL (ref 0.7–1.3)
Gamma Glob SerPl Elph-Mcnc: 1.2 g/dL (ref 0.4–1.8)
Globulin, Total: 3.2 g/dL (ref 2.2–3.9)
IgA/Immunoglobulin A, Serum: 357 mg/dL (ref 90–386)
IgG (Immunoglobin G), Serum: 1203 mg/dL (ref 603–1613)
IgM (Immunoglobulin M), Srm: 45 mg/dL (ref 20–172)
Total Protein: 7 g/dL (ref 6.0–8.5)

## 2020-09-08 LAB — RPR: RPR Ser Ql: NONREACTIVE

## 2020-09-08 LAB — HEMOGLOBIN A1C
Est. average glucose Bld gHb Est-mCnc: 111 mg/dL
Hgb A1c MFr Bld: 5.5 % (ref 4.8–5.6)

## 2020-09-08 LAB — HIV ANTIBODY (ROUTINE TESTING W REFLEX): HIV Screen 4th Generation wRfx: NONREACTIVE

## 2020-09-08 LAB — SJOGREN'S SYNDROME ANTIBODS(SSA + SSB)
ENA SSA (RO) Ab: <0.2 AI (ref 0.0–0.9)
ENA SSB (LA) Ab: <0.2 AI (ref 0.0–0.9)

## 2020-09-08 LAB — TSH: TSH: 2.4 u[IU]/mL (ref 0.450–4.500)

## 2020-09-13 LAB — ANA W/REFLEX: ANA Titer 1: NEGATIVE

## 2020-09-14 ENCOUNTER — Telehealth: Payer: Self-pay

## 2020-09-14 DIAGNOSIS — E539 Vitamin B deficiency, unspecified: Secondary | ICD-10-CM

## 2020-09-14 NOTE — Telephone Encounter (Addendum)
Pt called back about his lab results. Pt is asking for MRI and NCS/EMG order to be placed to further investigate his burning sensations since labs were normal.  Pt is reluctant to try gabapentin at this time since this would treat the symptoms he is having and medical cause has not been determined. Advised I would ask Dr. Leta Baptist about orders and would call back.  Pt verbalized understanding.

## 2020-09-14 NOTE — Telephone Encounter (Signed)
-----   Message from Penni Bombard, MD sent at 09/14/2020 10:50 AM EDT ----- Normal labs. Please call patient. -VRP

## 2020-09-14 NOTE — Telephone Encounter (Signed)
I called the pt and advised of results of lab testing via vm ( ok per dpr).   Pt advised to CB if he had any questions.

## 2020-09-15 NOTE — Addendum Note (Signed)
Addended by: Andrey Spearman R on: 09/15/2020 12:18 PM   Modules accepted: Orders

## 2020-09-15 NOTE — Telephone Encounter (Signed)
I called the pt and advised Dr. Leta Baptist is agreeable to the MRI and NCS/EMG.  Pt has been scheduled for 11/03/20 appt for NCS/EMG. Pt advised once insurance has been contacted, we will be in touch to scheduled the MRI/ or the imaging center would be in touch.

## 2020-09-15 NOTE — Telephone Encounter (Signed)
Orders Placed This Encounter  Procedures   MR LUMBAR SPINE WO CONTRAST   NCV with EMG(electromyography)    Penni Bombard, MD [id number], [id number] PM Certified in Neurology, Neurophysiology and Neuroimaging  [facility] [address], [place], [place] [address] [phone]

## 2020-09-22 ENCOUNTER — Telehealth: Payer: Self-pay | Admitting: Diagnostic Neuroimaging

## 2020-09-22 NOTE — Telephone Encounter (Signed)
[employer] pending uploaded notes on the portal  

## 2020-09-26 NOTE — Telephone Encounter (Signed)
I called [employer] and spoke with Tollie Pizza she stated it is still pending and in Market researcher review.

## 2020-09-27 NOTE — Telephone Encounter (Signed)
[employer] did not approve the MRI.  " Your healthcare provider told us that you are having lower back pain that travels to your hip and/or leg. The request cannot be approved because: imaging requires six weeks of provider directed treatment to be complete. Supported treatments include (but not limited to) drugs for swelling or pain, an in office workout (physical therapy), and/or oral or injected steroids. This must have been completed in the past three months without improved symptoms."  There is an option to do a peer to peer the phone number is [phone] option 3. The case number is [id number]. The deadline to be completed they informed me is 10/17/20. It would need to be scheduled.

## 2020-10-17 ENCOUNTER — Other Ambulatory Visit: Payer: Self-pay

## 2020-10-17 ENCOUNTER — Telehealth: Payer: Self-pay | Admitting: Internal Medicine

## 2020-10-17 ENCOUNTER — Encounter: Payer: Self-pay | Admitting: Internal Medicine

## 2020-10-17 ENCOUNTER — Ambulatory Visit (INDEPENDENT_AMBULATORY_CARE_PROVIDER_SITE_OTHER): Payer: 59 | Admitting: Internal Medicine

## 2020-10-17 VITALS — BP 128/88 | HR 83 | Temp 98.3°F | Resp 16 | Ht 73.0 in | Wt 295.5 lb

## 2020-10-17 DIAGNOSIS — I10 Essential (primary) hypertension: Secondary | ICD-10-CM | POA: Diagnosis not present

## 2020-10-17 DIAGNOSIS — M65342 Trigger finger, left ring finger: Secondary | ICD-10-CM | POA: Diagnosis not present

## 2020-10-17 DIAGNOSIS — Z122 Encounter for screening for malignant neoplasm of respiratory organs: Secondary | ICD-10-CM

## 2020-10-17 DIAGNOSIS — N211 Calculus in urethra: Secondary | ICD-10-CM

## 2020-10-17 DIAGNOSIS — Z0000 Encounter for general adult medical examination without abnormal findings: Secondary | ICD-10-CM | POA: Diagnosis not present

## 2020-10-17 DIAGNOSIS — Z87891 Personal history of nicotine dependence: Secondary | ICD-10-CM

## 2020-10-17 LAB — LIPID PANEL
Cholesterol: 128 mg/dL (ref 0–200)
HDL: 33.9 mg/dL — ABNORMAL LOW (ref >39.00)
LDL Cholesterol: 63 mg/dL (ref 0–99)
NonHDL: 94.5
Total CHOL/HDL Ratio: 4
Triglycerides: 158 mg/dL — ABNORMAL HIGH (ref 0.0–149.0)
VLDL: 31.6 mg/dL (ref 0.0–40.0)

## 2020-10-17 LAB — PSA: PSA: 1.2 ng/mL (ref 0.10–4.00)

## 2020-10-17 NOTE — Assessment & Plan Note (Signed)
Here for CPX Burning feet syndrome,peri-oral  numbness: The patient developed burning at the feet around the time he had COVID December 2021, saw neurology, extensive labs normal.  They were considering further eval Leukocytosis: Since the last visit, saw hematology, no evidence of pathologic process, follow-up as needed. Gallbladder polyps: last [place] 10-2018 polyp 8 mm, recommend a f/u US. Declining due to cost, he is aware of the implications. OSA: Using the CPAP most nights, encouraged good compliance. Trigger finger, 4th L: Refer to Ortho RTC 6 months

## 2020-10-17 NOTE — Telephone Encounter (Signed)
Patient would like a generalized referral to be sent to the urologist office. He states that the one he got assigned to doesn't have an opening for another month but the office told him that there are other surgeons that have openings. It just has to be sent for a generalized surgeon and they will get him in sooner.

## 2020-10-17 NOTE — Assessment & Plan Note (Signed)
-  Td 09-2011 -COVID-vaccine strongly recommended benefits>>risks  -Flu shot recommended "not today" -VWP:VXYIAX 2006 @ [facility] (d/t diverticulitis?); Cscope 07-2017,  Rectal polyp, internal and external hemorrhoids grade 4.  Next: 5 years -prostate ca screening: DRE normal, check a PSA. -Labs: FLP, PSA -Diet exercise: Discussed - Lung cancer screening: smoked 22-pack-year, quit 2020, recommend CT he agreed, depending on the cost.

## 2020-10-17 NOTE — Patient Instructions (Signed)
    GO TO THE LAB : Get the blood work     GO TO THE FRONT DESK, PLEASE SCHEDULE YOUR APPOINTMENTS Come back for a checkup in 6 months 

## 2020-10-17 NOTE — Progress Notes (Signed)
Subjective:    Patient ID: Mario Jimenez, male    DOB: 1965/10/05, 55 y.o.   MRN: [id number]  DOS:  10/17/2020 Type of visit - description: CPX  Since the last office visit is doing about the same. Still have some neuropathy type of symptoms. Also trigger finger, L hand, on goinf sxs  Review of Systems  Other than above, a 14 point review of systems is negative     Past Medical History:  Diagnosis Date   Allergy    Asthma mild   no inhaler   Gallbladder polyp 2018   55mm polyp   GERD (gastroesophageal reflux disease) occasional   watches diet and takes diet   Hemorrhoids    h/o Cscope    History of MRSA infection few yrs ago while in hospital in [place]   Meniscus tear 8-12   to have surgery-- left knee   OSA on CPAP    Urolithiasis 2007-2008   [place] left [place]  3 surgeries ; has passed stones since then     Past Surgical History:  Procedure Laterality Date   CYSTOSCOPY/RETROGRADE/URETEROSCOPY/STONE EXTRACTION WITH BASKET  2007-- x2   KNEE ARTHROSCOPY  12/05/2010   Procedure: ARTHROSCOPY KNEE;  Surgeon: Jeneen Rinks P Aplington;  Location: [facility];  Service: Orthopedics;  Laterality: Left;  LEFT KNEE ARTHROSCOPY WITH PARTIAL MEDIAL  MENISECTOMY, Total lateral menisectomy, Shaving of Medial Femoral condyle   leg surgery  1977- age 74   Left , s/p traction, body cast    Social History   Socioeconomic History   Marital status: Single    Spouse name: Not on file   Number of children: 0   Years of education: Not on file   Highest education level: Not on file  Occupational History   Occupation: work Architect  Tobacco Use   Smoking status: Former    Types: Cigarettes    Quit date: 08/2018    Years since quitting: 2.1   Smokeless tobacco: Never   Tobacco comments:    Smoke on-off    1 ppd age 24 to 4 =16 pack-year    1 ppd [age] = 6 pack-year     Total: 22 pack-year    Quit 2020  Vaping Use   Vaping Use: Never used  Substance and Sexual  Activity   Alcohol use: Not Currently    Comment: None Since 2017 MB RN   Drug use: No   Sexual activity: Not on file  Other Topics Concern   Not on file  Social History Narrative   From [place], used to live in [place] x a while, get back to [facility] 2009   Lives by himself    Right handed    Caffeine- 32 oz per day    Social Determinants of Health   Financial Resource Strain: Not on file  Food Insecurity: Not on file  Transportation Needs: Not on file  Physical Activity: Not on file  Stress: Not on file  Social Connections: Not on file  Intimate Partner Violence: Not on file     Allergies as of 10/17/2020       Reactions   Contrast Media [iodinated Diagnostic Agents] Hives   Ioxaglate Hives   Sulfa Antibiotics Hives        Medication List        Accurate as of October 17, 2020  3:33 PM. If you have any questions, ask your nurse or doctor.  aspirin EC 81 MG tablet Take 81 mg by mouth daily. Swallow whole.   calcium carbonate 1250 (500 Ca) MG tablet Commonly known as: OS-CAL - dosed in mg of elemental calcium Take 1 tablet by mouth.   co-enzyme Q-10 30 MG capsule Take 30 mg by mouth 3 (three) times daily.   multivitamin capsule Take 1 capsule by mouth daily.   naproxen sodium 220 MG tablet Commonly known as: ALEVE Take 220 mg by mouth 2 (two) times daily as needed (pain).   omega-3 acid ethyl esters 1 g capsule Commonly known as: LOVAZA Take by mouth 2 (two) times daily.   vitamin C 1000 MG tablet Take 1,000 mg by mouth daily.   zinc gluconate 50 MG tablet Take 50 mg by mouth daily.           Objective:   Physical Exam BP 128/88 (BP Location: Left Arm, Patient Position: Sitting, Cuff Size: Normal)   Pulse 83   Temp 98.3 F (36.8 C) (Oral)   Resp 16   Ht 6\' 1"  (1.854 m)   Wt 295 lb 8 oz (134 kg)   SpO2 98%   BMI 38.99 kg/m  General: Well developed, NAD, BMI noted Neck: No  thyromegaly  HEENT:  Normocephalic . Face symmetric,  atraumatic Lungs:  CTA B Normal respiratory effort, no intercostal retractions, no accessory muscle use. Heart: RRR,  no murmur.  Abdomen:  Not distended, soft, non-tender. No rebound or rigidity. DRE: + External skin tags.  Sphincter normal, prostate normal, no stools found. Lower extremities: no pretibial edema bilaterally  Skin: Exposed areas without rash. Not pale. Not jaundice Neurologic:  alert & oriented X3.  Speech normal, gait appropriate for age and unassisted Strength symmetric and appropriate for age.  Psych: Cognition and judgment appear intact.  Cooperative with normal attention span and concentration.  Behavior appropriate. No anxious or depressed appearing.     Assessment     ASSESSMENT Asthma GERD Chronic sinusitis, Allergies Urolithiasis History of diverticulitis GB polyp, saw surgery/2018, rec a [place] in 6 months. Surgery if polyp >  10 mm Smoker: quit 08/2018 Chronic sinusitis: Seen by ENT 2018, Dr Laurance Flatten, rx medical treatment, no polyps OSA dx 07/2019 Covid infection 12/2019, infusion 12/23/2019  PLAN: Here for CPX Burning feet syndrome,peri-oral  numbness: The patient developed burning at the feet around the time he had COVID December 2021, saw neurology, extensive labs normal.  They were considering further eval Leukocytosis: Since the last visit, saw hematology, no evidence of pathologic process, follow-up as needed. Gallbladder polyps: last [place] 10-2018 polyp 8 mm, recommend a f/u US. Declining due to cost, he is aware of the implications. OSA: Using the CPAP most nights, encouraged good compliance. Trigger finger, 4th L: Refer to Ortho RTC 6 months     This visit occurred during the SARS-CoV-2 public health emergency.  Safety protocols were in place, including screening questions prior to the visit, additional usage of staff PPE, and extensive cleaning of exam room while observing appropriate contact time as indicated for disinfecting solutions.

## 2020-10-18 NOTE — Telephone Encounter (Signed)
Noted, thank you

## 2020-10-19 NOTE — Telephone Encounter (Signed)
Referral placed to [facility].

## 2020-10-27 ENCOUNTER — Other Ambulatory Visit: Payer: Self-pay | Admitting: Neurology

## 2020-10-27 MED ORDER — GABAPENTIN 100 MG PO CAPS: 100.0000 mg | ORAL_CAPSULE | Freq: Three times a day (TID) | ORAL | 2 refills | Status: DC

## 2020-11-03 ENCOUNTER — Encounter: Payer: 59 | Admitting: Diagnostic Neuroimaging

## 2020-11-03 ENCOUNTER — Ambulatory Visit (INDEPENDENT_AMBULATORY_CARE_PROVIDER_SITE_OTHER): Payer: 59 | Admitting: Diagnostic Neuroimaging

## 2020-11-03 DIAGNOSIS — R200 Anesthesia of skin: Secondary | ICD-10-CM | POA: Diagnosis not present

## 2020-11-03 DIAGNOSIS — E539 Vitamin B deficiency, unspecified: Secondary | ICD-10-CM

## 2020-11-03 DIAGNOSIS — Z0289 Encounter for other administrative examinations: Secondary | ICD-10-CM

## 2020-11-09 NOTE — Procedures (Signed)
[facility]  NCS (NERVE CONDUCTION STUDY) WITH EMG (ELECTROMYOGRAPHY) REPORT   STUDY DATE: 11/03/20 PATIENT NAME: Mario Jimenez DOB: 06-05-1965 MRN: [id number]  ORDERING CLINICIAN: Andrey Spearman, MD   TECHNOLOGIST: Sherre Scarlet ELECTROMYOGRAPHER: Earlean Polka. Anneli Bing, MD  CLINICAL INFORMATION: 55 year old male with bilateral foot numbness.  FINDINGS: NERVE CONDUCTION STUDY:  Bilateral peroneal motor responses are normal.  Bilateral tibial motor responses have normal distal latencies, decreased amplitudes and normal conduction velocities.  Bilateral sural and left superficial peroneal sensory responses are normal.  Right superficial peroneal sensory response has decreased amplitude and normal peak latency.  Bilateral tibial F wave latencies are slightly prolonged.    NEEDLE ELECTROMYOGRAPHY:  Needle examination of right lower extremity and right lumbar paraspinal muscles notable for chronic denervation in right tibialis anterior and right gastrocnemius muscles.   IMPRESSION:   Abnormal study demonstrating:  - Mild axonal sensorimotor polyneuropathy.    INTERPRETING PHYSICIAN:  Penni Bombard, MD Certified in Neurology, Neurophysiology and Neuroimaging  [facility] [address] [place], [place] [address] [phone]   [facility]    Nerve / Sites Muscle Latency Ref. Amplitude Ref. Rel Amp Segments Distance Velocity Ref. Area    ms ms mV mV %  cm m/s m/s mVms  L Peroneal - EDB     Ankle EDB 3.5 ?6.5 7.7 ?2.0 100 Ankle - EDB 9   19.0     Fib head EDB 11.3  6.5  83.4 Fib head - Ankle 34 44 ?44 17.7     Pop fossa EDB 13.5  6.4  99.8 Pop fossa - Fib head 10 44 ?44 18.0         Pop fossa - Ankle      R Peroneal - EDB     Ankle EDB 3.0 ?6.5 3.9 ?2.0 100 Ankle - EDB 9   10.2     Fib head EDB 10.7  3.2  83.2 Fib head - Ankle 35 46 ?44 9.2     Pop fossa EDB 12.9  3.1  97 Pop fossa - Fib head 10 46 ?44 9.1         Pop  fossa - Ankle      L Tibial - AH     Ankle AH 3.3 ?5.8 1.7 ?4.0 100 Ankle - AH 9   4.0     Pop fossa AH 13.2  1.0  59.1 Pop fossa - Ankle 47 47 ?41 3.4  R Tibial - AH     Ankle AH 2.8 ?5.8 1.0 ?4.0 100 Ankle - AH 9   1.3     Pop fossa AH 13.7  1.2  117 Pop fossa - Ankle 46 42 ?41 4.9             SNC    Nerve / Sites Rec. Site Peak Lat Ref.  Amp Ref. Segments Distance    ms ms V V  cm  L Sural - Ankle (Calf)     Calf Ankle 3.0 ?4.4 7 ?6 Calf - Ankle 14  R Sural - Ankle (Calf)     Calf Ankle 3.4 ?4.4 7 ?6 Calf - Ankle 14  L Superficial peroneal - Ankle     Lat leg Ankle 3.3 ?4.4 7 ?6 Lat leg - Ankle 14  R Superficial peroneal - Ankle     Lat leg Ankle 3.3 ?4.4 2 ?6 Lat leg - Ankle 14  F  Wave    Nerve F Lat Ref.   ms ms  L Tibial - AH 56.8 ?56.0  R Tibial - AH 58.0 ?56.0         EMG Summary Table    Spontaneous MUAP Recruitment  Muscle IA Fib PSW Fasc Other Amp Dur. Poly Pattern  R. Vastus medialis Normal None None None _______ Normal Normal Normal Normal  R. Tibialis anterior Normal None None None _______ Increased Normal Normal Reduced  R. Gastrocnemius (Medial head) Normal None None None _______ Increased Normal Normal Reduced  R. Lumbar paraspinals Normal None None None _______ Normal Normal Normal Normal

## 2021-02-20 ENCOUNTER — Telehealth: Payer: Self-pay | Admitting: Internal Medicine

## 2021-02-20 DIAGNOSIS — K824 Cholesterolosis of gallbladder: Secondary | ICD-10-CM

## 2021-02-20 NOTE — Telephone Encounter (Signed)
Pt is requesting referral for ultrasound on gallbladder and colonoscopy. Please advise.

## 2021-02-20 NOTE — Telephone Encounter (Signed)
Please arrange a gallbladder ultrasound, DX follow-up gallbladder polyp. Had a C-scope 08/14/2017, next in 5 years.  Too early for a referral unless he has any specific symptoms.  If that is the case we will need a visit with me or GI

## 2021-02-20 NOTE — Telephone Encounter (Signed)
Please advise 

## 2021-02-20 NOTE — Telephone Encounter (Signed)
Order placed for ultrasound. Mychart message sent to Pt w/ number to call imaging to schedule and to inform that he is not due for next cscope until 07/2022.

## 2021-02-24 ENCOUNTER — Ambulatory Visit: Payer: 59 | Admitting: Internal Medicine

## 2021-02-24 ENCOUNTER — Encounter: Payer: Self-pay | Admitting: Internal Medicine

## 2021-02-24 VITALS — BP 130/80 | HR 90 | Temp 98.5°F | Resp 16 | Ht 74.0 in | Wt 300.0 lb

## 2021-02-24 DIAGNOSIS — K625 Hemorrhage of anus and rectum: Secondary | ICD-10-CM

## 2021-02-24 DIAGNOSIS — K5900 Constipation, unspecified: Secondary | ICD-10-CM

## 2021-02-24 DIAGNOSIS — R202 Paresthesia of skin: Secondary | ICD-10-CM

## 2021-02-24 DIAGNOSIS — D414 Neoplasm of uncertain behavior of bladder: Secondary | ICD-10-CM | POA: Diagnosis not present

## 2021-02-24 LAB — CBC
HCT: 42.6 % (ref 39.0–52.0)
Hemoglobin: 14 g/dL (ref 13.0–17.0)
MCHC: 32.8 g/dL (ref 30.0–36.0)
MCV: 88 fl (ref 78.0–100.0)
Platelets: 196 10*3/uL (ref 150.0–400.0)
RBC: 4.84 Mil/uL (ref 4.22–5.81)
RDW: 14.4 % (ref 11.5–15.5)
WBC: 11.4 10*3/uL — ABNORMAL HIGH (ref 4.0–10.5)

## 2021-02-24 MED ORDER — HYDROCORTISONE ACETATE 25 MG RE SUPP: 25.0000 mg | Freq: Two times a day (BID) | RECTAL | 1 refills | Status: DC | PRN

## 2021-02-24 MED ORDER — GABAPENTIN 100 MG PO CAPS: 200.0000 mg | ORAL_CAPSULE | Freq: Two times a day (BID) | ORAL | 6 refills | Status: DC

## 2021-02-24 NOTE — Patient Instructions (Signed)
We are referring you to the gastroenterologist, if they do not call you within few days, reach out to them at [facility] : Get the blood work     [place], [place] back for a physical exam by October 2023   Stop by the first floor and schedule ultrasound of your gallbladder

## 2021-02-24 NOTE — Progress Notes (Signed)
Subjective:    Patient ID: Mario Jimenez, male    DOB: 07/22/1965, 56 y.o.   MRN: [id number]  DOS:  02/24/2021 Type of visit - description: f/u Follow-up from previous visit. Since the last visit, his brother was diagnosed with stage IV colon cancer. Patient is concerned.  Last week, experienced constipation for about 4 days. He had some nausea but no vomiting. He started to pass red blood per rectum few times a day ("a lot of blood" per pt ) until he finally had a BM.  Denies chronic or ongoing GI symptoms. No fever chills No weight loss   Wt Readings from Last 3 Encounters:  02/24/21 300 lb (136.1 kg)  10/17/20 295 lb 8 oz (134 kg)  09/05/20 292 lb (132.5 kg)     Review of Systems See above   Past Medical History:  Diagnosis Date   Allergy    Asthma mild   no inhaler   Gallbladder polyp 2018   72mm polyp   GERD (gastroesophageal reflux disease) occasional   watches diet and takes diet   Hemorrhoids    h/o Cscope    History of MRSA infection few yrs ago while in hospital in [place]   Meniscus tear 8-12   to have surgery-- left knee   OSA on CPAP    Urolithiasis 2007-2008   [place] left kidney  3 surgeries ; has passed stones since then     Past Surgical History:  Procedure Laterality Date   CYSTOSCOPY/RETROGRADE/URETEROSCOPY/STONE EXTRACTION WITH BASKET  2007-- x2   KNEE ARTHROSCOPY  12/05/2010   Procedure: ARTHROSCOPY KNEE;  Surgeon: Jeneen Rinks P Aplington;  Location: [facility];  Service: Orthopedics;  Laterality: Left;  LEFT KNEE ARTHROSCOPY WITH PARTIAL MEDIAL  MENISECTOMY, Total lateral menisectomy, Shaving of Medial Femoral condyle   leg surgery  1977- age 38   Left , s/p traction, body cast    Current Outpatient Medications  Medication Instructions   aspirin EC 81 mg, Oral, Daily, Swallow whole.   calcium carbonate (OS-CAL - DOSED IN MG OF ELEMENTAL CALCIUM) 1250 (500 Ca) MG tablet 1 tablet, Oral   co-enzyme Q-10 30 mg, Oral, 3 times daily    gabapentin (NEURONTIN) 100 mg, Oral, 3 times daily   Multiple Vitamin (MULTIVITAMIN) capsule 1 capsule, Oral, Daily   naproxen sodium (ALEVE) 220 mg, Oral, 2 times daily PRN   omega-3 acid ethyl esters (LOVAZA) 1 g capsule Oral, 2 times daily   vitamin C 1,000 mg, Oral, Daily   zinc gluconate 50 mg, Oral, Daily       Objective:   Physical Exam BP 130/80 (BP Location: Right Arm, Patient Position: Sitting, Cuff Size: Normal)    Pulse 90    Temp 98.5 F (36.9 C) (Oral)    Resp 16    Ht 6\' 2"  (1.88 m)    Wt 300 lb (136.1 kg)    SpO2 95%    BMI 38.52 kg/m  General:   Well developed, NAD, BMI noted.  HEENT:  Normocephalic . Face symmetric, atraumatic Lungs:  CTA B Normal respiratory effort, no intercostal retractions, no accessory muscle use. Heart: RRR,  no murmur.  Abdomen:  Not distended, soft, minimal discomfort ample palpation of the lower abdomen without mass or rebound Skin: Not pale. Not jaundice Lower extremities: no pretibial edema bilaterally  Neurologic:  alert & oriented X3.  Speech normal, gait appropriate for age and unassisted Psych--  Cognition and judgment appear intact.  Cooperative with normal attention  span and concentration.  Behavior appropriate. No anxious or depressed appearing.     Assessment    ASSESSMENT Asthma GERD Chronic sinusitis, Allergies Urolithiasis History of diverticulitis GB polyp, saw surgery/2018, rec a [place] in 6 months. Surgery if polyp >  10 mm Smoker: quit 08/2018 Chronic sinusitis: Seen by ENT 2018, Dr Laurance Flatten, rx medical treatment, no polyps OSA dx 07/2019 Covid infection 12/2019, infusion 12/23/2019  PLAN: Constipation, red blood per rectum, H/oh hemorrhoids: Symptoms as described above, in addition his older brother was just dx w/  advanced colon cancer.  He is concerned, we agreed on a GI referral, consider early colonoscopy. Check a CBC to be sure he has no severe anemia.  Suppository sent for symptomatic treatment on what  seems to be distal benign bleeding. Gallbladder polyps: Check ultrasound. Burning feet syndrome, perioral numbness: Saw neurology, was Rx gabapentin, it is helping.  Would like to change from TID to BID for convenience sake.  New prescription sent. Preventive care: Declines flu or COVID-vaccine RTC 10-2021 CPX     This visit occurred during the SARS-CoV-2 public health emergency.  Safety protocols were in place, including screening questions prior to the visit, additional usage of staff PPE, and extensive cleaning of exam room while observing appropriate contact time as indicated for disinfecting solutions.

## 2021-02-26 NOTE — Assessment & Plan Note (Signed)
Constipation, red blood per rectum, H/oh hemorrhoids: Symptoms as described above, in addition his older brother was just dx w/  advanced colon cancer.  He is concerned, we agreed on a GI referral, consider early colonoscopy. Check a CBC to be sure he has no severe anemia.  Suppository sent for symptomatic treatment on what seems to be distal benign bleeding. Gallbladder polyps: Check ultrasound. Burning feet syndrome, perioral numbness: Saw neurology, was Rx gabapentin, it is helping.  Would like to change from TID to BID for convenience sake.  New prescription sent. Preventive care: Declines flu or COVID-vaccine RTC 10-2021 CPX

## 2021-02-27 ENCOUNTER — Encounter: Payer: Self-pay | Admitting: Internal Medicine

## 2021-02-27 ENCOUNTER — Ambulatory Visit: Payer: 59 | Admitting: Internal Medicine

## 2021-02-28 ENCOUNTER — Encounter: Payer: Self-pay | Admitting: Gastroenterology

## 2021-03-27 ENCOUNTER — Other Ambulatory Visit: Payer: Self-pay

## 2021-03-27 ENCOUNTER — Encounter: Payer: Self-pay | Admitting: Gastroenterology

## 2021-03-27 ENCOUNTER — Telehealth: Payer: Self-pay | Admitting: Internal Medicine

## 2021-03-27 ENCOUNTER — Ambulatory Visit (INDEPENDENT_AMBULATORY_CARE_PROVIDER_SITE_OTHER): Payer: 59 | Admitting: Gastroenterology

## 2021-03-27 VITALS — BP 130/88 | HR 100 | Ht 74.0 in | Wt 298.2 lb

## 2021-03-27 DIAGNOSIS — K5730 Diverticulosis of large intestine without perforation or abscess without bleeding: Secondary | ICD-10-CM | POA: Diagnosis not present

## 2021-03-27 DIAGNOSIS — K580 Irritable bowel syndrome with diarrhea: Secondary | ICD-10-CM

## 2021-03-27 DIAGNOSIS — K5010 Crohn's disease of large intestine without complications: Secondary | ICD-10-CM

## 2021-03-27 DIAGNOSIS — Z800 Family history of malignant neoplasm of digestive organs: Secondary | ICD-10-CM | POA: Diagnosis not present

## 2021-03-27 MED ORDER — GABAPENTIN 100 MG PO CAPS: 200.0000 mg | ORAL_CAPSULE | Freq: Two times a day (BID) | ORAL | 0 refills | Status: DC

## 2021-03-27 MED ORDER — DICYCLOMINE HCL 10 MG PO CAPS: 10.0000 mg | ORAL_CAPSULE | Freq: Two times a day (BID) | ORAL | 3 refills | Status: DC

## 2021-03-27 MED ORDER — CLENPIQ 10-3.5-12 MG-GM -GM/160ML PO SOLN: 1.0000 | Freq: Once | ORAL | 0 refills | Status: DC

## 2021-03-27 NOTE — Telephone Encounter (Signed)
Pt requesting refill on meds  ? ?gabapentin (NEURONTIN) 100 MG capsule [[id number]] ? ?[id number] - [address], [address]  ?[place], [place] [address]  ?Phone:  [phone]  Fax:  [phone]   ?

## 2021-03-27 NOTE — Telephone Encounter (Signed)
Rx sent 

## 2021-03-27 NOTE — Progress Notes (Signed)
? ? ?Chief Complaint: For colonoscopy ? ?Referring Provider:  Colon Branch, MD    ? ? ?ASSESSMENT AND PLAN;  ? ?#1. FH colon ca (age 56, brother with stage IV)- new Dx ? ?#2. H/O SCAD ? ?#3. IBS-D.  Diarrhea currently resolved. ? ?#4.  GERD (EGD 2006.  No Barrett's), well controlled on pepcid ? ? ? ?Plan: ?-Colon with 2 day prep (mon or Friday) in 6-8 weeks ?-Bentyl '10mg'$  po BID #60 ?-Nonpharmacologic means of reflux control. ? ? ? ?Discussed risks & benefits of colonoscopy. Risks including rare perforation req laparotomy, bleeding after bx/polypectomy req blood transfusion, rarely missing neoplasms, risks of anesthesia/sedation, rare risk of damage to internal organs. Benefits outweigh the risks. Patient agrees to proceed. All the questions were answered. Pt consents to proceed. ? ? ? ?HPI:   ? ?Mario Jimenez is a 56 y.o. male  ?With long-standing history of GI complaints ? ?Brother  Dx with stage IV colon cancer at age 23 ? ?He has been advised to get repeat colon performed. ? ?Occ abdominal discomfort-more or less generalized.  No diarrhea.  Has occasional constipation with pellet-like stools.  Better with increased water intake and fiber supplements. ? ?He did notice some bright red blood per rectum which he attributes to hemorrhoids.  Note that he does have history of scad. ? ?No upper GI symptoms including nausea, vomiting, heartburn, regurgitation, odynophagia or dysphagia.  No weight loss. ? ? ?Previous GI history/work-up: ? ?H/O diverticulitis in 2006 -colonoscopy by Dr. Sharlett Iles revealed sigmoid diverticulosis with segmental colitis. CT 05/2017 showed sigmoid diverticulitis with sigmoid wall thickening.  Treated with antibiotics. ? ?CT Abdo/pelvis without contrast 06/2019 ?-Bilateral nephrolithiasis. 4 mm proximal right ureteral stone. No ?hydronephrosis. ?-Sigmoid diverticulosis.  No active diverticulitis. ? ?Colonoscopy 07/2017 ?- Rectal polyp status post polypectomy. Bx-hyperplastic ?- Moderate sigmoid  diverticulosis with mild associated colitis likely SCAD ( bx- neg) ?- Internal and external hemorrhoids (Grade IV). ? ?Wt Readings from Last 3 Encounters:  ?03/27/21 298 lb 4 oz (135.3 kg)  ?02/24/21 300 lb (136.1 kg)  ?10/17/20 295 lb 8 oz (134 kg)  ? ? ? ?Past Medical History:  ?Diagnosis Date  ? Allergy   ? Asthma mild  ? no inhaler  ? Gallbladder polyp 2018  ? 20m polyp  ? GERD (gastroesophageal reflux disease) occasional  ? watches diet and takes diet  ? Hemorrhoids   ? h/o Cscope   ? History of MRSA infection few yrs ago while in hospital in [place] ? Meniscus tear 8-12  ? to have surgery-- left knee  ? OSA on CPAP   ? Urolithiasis 2007-2008  ? ([place] kidney  3 surgeries ; has passed stones since then   ? ? ?Past Surgical History:  ?Procedure Laterality Date  ? CYSTOSCOPY/RETROGRADE/URETEROSCOPY/STONE EXTRACTION WITH BASKET  2007-- x2  ? KNEE ARTHROSCOPY  12/05/2010  ? Procedure: ARTHROSCOPY KNEE;  Surgeon: JLaurice RecordAplington;  Location[facility]  Service: Orthopedics;  Laterality: Left;  LEFT KNEE ARTHROSCOPY WITH PARTIAL MEDIAL  MENISECTOMY, Total lateral menisectomy, Shaving of Medial Femoral condyle  ? leg surgery  132 age 56 ? Left , s/p traction, body cast  ? ? ?Family History  ?Problem Relation Age of Onset  ? Stroke Mother   ?     M d/t brain tumor   ? Cancer Mother   ?     M brain tumor  ? Coronary artery disease Father   ?  F, MI 68 y/o  ? Colon cancer Brother 69  ?     dx 12-2020  ? Colon cancer Maternal Grandmother   ? Diabetes Neg Hx   ? Prostate cancer Neg Hx   ? Esophageal cancer Neg Hx   ? ? ?Social History  ? ?Tobacco Use  ? Smoking status: Former  ?  Types: Cigarettes  ?  Quit date: 08/2018  ?  Years since quitting: 2.6  ? Smokeless tobacco: Never  ? Tobacco comments:  ?  Smoke on-off  ?  1 ppd age 60 to 89 =16 pack-year  ?  1 ppd [age] = 6 pack-year   ?  Total: 22 pack-year  ?  Quit 2020  ?Vaping Use  ? Vaping Use: Never used  ?Substance Use Topics  ? Alcohol use:  Not Currently  ?  Comment: None Since 2017 MB RN  ? Drug use: No  ? ? ?Current Outpatient Medications  ?Medication Sig Dispense Refill  ? Ascorbic Acid (VITAMIN C) 1000 MG tablet Take 1,000 mg by mouth daily.    ? aspirin EC 81 MG tablet Take 81 mg by mouth daily. Swallow whole.    ? calcium carbonate (OS-CAL - DOSED IN MG OF ELEMENTAL CALCIUM) 1250 (500 Ca) MG tablet Take 1 tablet by mouth.    ? co-enzyme Q-10 30 MG capsule Take 30 mg by mouth 3 (three) times daily.    ? gabapentin (NEURONTIN) 100 MG capsule Take 2 capsules (200 mg total) by mouth 2 (two) times daily. 120 capsule 6  ? hydrocortisone (ANUSOL-HC) 25 MG suppository Place 1 suppository (25 mg total) rectally 2 (two) times daily as needed for hemorrhoids. 12 suppository 1  ? Multiple Vitamin (MULTIVITAMIN) capsule Take 1 capsule by mouth daily.    ? naproxen sodium (ALEVE) 220 MG tablet Take 220 mg by mouth 2 (two) times daily as needed (pain).    ? omega-3 acid ethyl esters (LOVAZA) 1 g capsule Take by mouth 2 (two) times daily.    ? zinc gluconate 50 MG tablet Take 50 mg by mouth daily.    ? ?No current facility-administered medications for this visit.  ? ? ?Allergies  ?Allergen Reactions  ? Contrast Media [Iodinated Contrast Media] Hives  ? Ioxaglate Hives  ? Sulfa Antibiotics Hives  ? ? ?Review of Systems:  ?Constitutional: Denies fever, chills, diaphoresis, appetite change and fatigue.  ?HEENT: Denies photophobia, eye pain, redness, hearing loss, ear pain, congestion, sore throat, rhinorrhea, sneezing, mouth sores, neck pain, neck stiffness and tinnitus. Has allergies. ?Respiratory: Denies SOB, DOE, chest tightness,  and wheezing. Has cough. ?Cardiovascular: Denies chest pain, palpitations and leg swelling.  ?Genitourinary: Has kidney stones ?Musculoskeletal: Denies myalgias, back pain, joint swelling, arthralgias and gait problem.  ?Skin: No rash.  ?Neurological: Denies dizziness, seizures, syncope, weakness, light-headedness, numbness and  headaches.  ?Hematological: Denies adenopathy. Easy bruising, personal or family bleeding history  ?Psychiatric/Behavioral: Has anxiety due to his job , nodepression ? ?  ? ?Physical Exam:   ? ?BP 130/88   Pulse 100   Ht '6\' 2"'$  (1.88 m)   Wt 298 lb 4 oz (135.3 kg)   SpO2 100%   BMI 38.29 kg/m?  ?Filed Weights  ? 03/27/21 1005  ?Weight: 298 lb 4 oz (135.3 kg)  ? ?Constitutional:  Well-developed, in no acute distress. ?Psychiatric: Normal mood and affect. Behavior is normal. ?HEENT: Pupils normal.  Conjunctivae are normal. No scleral icterus. ?Neck supple.  ?Cardiovascular: Normal rate, regular rhythm. No edema ?  Pulmonary/chest: Effort normal and breath sounds normal. No wheezing, rales or rhonchi. ?Abdominal: Soft, nondistended. Nontender. Bowel sounds active throughout. There are no masses palpable. No hepatomegaly. ?Rectal:  defered ?Neurological: Alert and oriented to person place and time. ?Skin: Skin is warm and dry. No rashes noted. ? ?Data Reviewed: I have personally reviewed following labs and imaging studies ? ?CBC: ?CBC Latest Ref Rng & Units 02/24/2021 06/06/2020 05/16/2020  ?WBC 4.0 - 10.5 K/uL 11.4(H) 6.9 11.7(H)  ?Hemoglobin 13.0 - 17.0 g/dL 14.0 14.4 14.2  ?Hematocrit 39.0 - 52.0 % 42.6 44.3 43.0  ?Platelets 150.0 - 400.0 K/uL 196.0 173 204.0  ? ? ?CMP: ?CMP Latest Ref Rng & Units 09/05/2020 06/06/2020 10/23/2019  ?Glucose 70 - 99 mg/dL - 151(H) 82  ?BUN 6 - 20 mg/dL - 13 11  ?Creatinine 0.61 - 1.24 mg/dL - 1.18 0.97  ?Sodium 135 - 145 mmol/L - 138 139  ?Potassium 3.5 - 5.1 mmol/L - 4.1 4.5  ?Chloride 98 - 111 mmol/L - 99 103  ?CO2 22 - 32 mmol/L - 33(H) 28  ?Calcium 8.9 - 10.3 mg/dL - 9.8 9.1  ?Total Protein 6.0 - 8.5 g/dL 7.0 7.3 -  ?Total Bilirubin 0.3 - 1.2 mg/dL - 0.6 -  ?Alkaline Phos 38 - 126 U/L - 63 -  ?AST 15 - 41 U/L - 21 -  ?ALT 0 - 44 U/L - 18 -  ? ?CT report from [facility] was reviewed-sigmoid wall thickening with diverticulitis.  Recommend colonoscopy electively. ? ? ?Carmell Austria, MD  03/27/2021, 10:22 AM ? ?Cc: Colon Branch, MD ? ? ?

## 2021-03-27 NOTE — Patient Instructions (Addendum)
If you are age 54 or older, your body mass index should be between 23-30. Your Body mass index is 38.29 kg/m?Marland Kitchen If this is out of the aforementioned range listed, please consider follow up with your Primary Care Provider. ? ?If you are age 92 or younger, your body mass index should be between 19-25. Your Body mass index is 38.29 kg/m?Marland Kitchen If this is out of the aformentioned range listed, please consider follow up with your Primary Care Provider.  ? ?________________________________________________________ ? ?The [facility] providers would like to encourage you to use [facility] to communicate with providers for non-urgent requests or questions.  Due to long hold times on the telephone, sending your provider a message by [facility] may be a faster and more efficient way to get a response.  Please allow 48 business hours for a response.  Please remember that this is for non-urgent requests.  ?_______________________________________________________ ? ?You have been scheduled for a colonoscopy. Please follow written instructions given to you at your visit today.  ?Please pick up your prep supplies at the pharmacy within the next 1-3 days. ?If you use inhalers (even only as needed), please bring them with you on the day of your procedure. ? ?Two days before your procedure: Mix 3 packs (or capfuls) of Miralax in 48 ounces of clear liquid and drink at 6pm. ? ? ?We have sent the following medications to your pharmacy for you to pick up at your convenience: ?Clenpiq-please call us 3 weeks in advance to let us know where  to send the script ?Bentyl ? ?Please call with any questions or concerns. ? ?Thank you, ? ?Dr. Jackquline Denmark ? ?

## 2021-05-03 ENCOUNTER — Other Ambulatory Visit: Payer: Self-pay | Admitting: Internal Medicine

## 2021-05-04 MED ORDER — GABAPENTIN 100 MG PO CAPS: 200.0000 mg | ORAL_CAPSULE | Freq: Two times a day (BID) | ORAL | 5 refills | Status: DC

## 2021-05-16 ENCOUNTER — Encounter: Payer: Self-pay | Admitting: Gastroenterology

## 2021-05-30 ENCOUNTER — Encounter: Payer: Self-pay | Admitting: Gastroenterology

## 2021-05-30 ENCOUNTER — Ambulatory Visit (AMBULATORY_SURGERY_CENTER): Payer: 59 | Admitting: Gastroenterology

## 2021-05-30 VITALS — BP 138/85 | HR 81 | Temp 98.6°F | Resp 16 | Ht 74.0 in | Wt 298.0 lb

## 2021-05-30 DIAGNOSIS — Z800 Family history of malignant neoplasm of digestive organs: Secondary | ICD-10-CM

## 2021-05-30 DIAGNOSIS — Z1211 Encounter for screening for malignant neoplasm of colon: Secondary | ICD-10-CM

## 2021-05-30 DIAGNOSIS — K5730 Diverticulosis of large intestine without perforation or abscess without bleeding: Secondary | ICD-10-CM

## 2021-05-30 DIAGNOSIS — K580 Irritable bowel syndrome with diarrhea: Secondary | ICD-10-CM

## 2021-05-30 DIAGNOSIS — K5010 Crohn's disease of large intestine without complications: Secondary | ICD-10-CM | POA: Diagnosis not present

## 2021-05-30 MED ORDER — SODIUM CHLORIDE 0.9 % IV SOLN: 500.0000 mL | Freq: Once | INTRAVENOUS | Status: DC

## 2021-05-30 NOTE — Progress Notes (Signed)
? ? ?Chief Complaint: For colonoscopy ? ?Referring Provider:  Colon Branch, MD    ? ? ?ASSESSMENT AND PLAN;  ? ?#1. FH colon ca (age 56, brother with stage IV)- new Dx ? ?#2. H/O SCAD ? ?#3. IBS-D.  Diarrhea currently resolved. ? ?#4.  GERD (EGD 2006.  No Barrett's), well controlled on pepcid ? ? ? ?Plan: ?-Colon with 2 day prep (mon or Friday) in 6-8 weeks ?-Bentyl '10mg'$  po BID #60 ?-Nonpharmacologic means of reflux control. ? ? ? ?Discussed risks & benefits of colonoscopy. Risks including rare perforation req laparotomy, bleeding after bx/polypectomy req blood transfusion, rarely missing neoplasms, risks of anesthesia/sedation, rare risk of damage to internal organs. Benefits outweigh the risks. Patient agrees to proceed. All the questions were answered. Pt consents to proceed. ? ? ? ?HPI:   ? ?Mario Jimenez is a 56 y.o. male  ?With long-standing history of GI complaints ? ?Brother  Dx with stage IV colon cancer at age 14 ? ?He has been advised to get repeat colon performed. ? ?Occ abdominal discomfort-more or less generalized.  No diarrhea.  Has occasional constipation with pellet-like stools.  Better with increased water intake and fiber supplements. ? ?He did notice some bright red blood per rectum which he attributes to hemorrhoids.  Note that he does have history of scad. ? ?No upper GI symptoms including nausea, vomiting, heartburn, regurgitation, odynophagia or dysphagia.  No weight loss. ? ? ?Previous GI history/work-up: ? ?H/O diverticulitis in 2006 -colonoscopy by Dr. Sharlett Iles revealed sigmoid diverticulosis with segmental colitis. CT 05/2017 showed sigmoid diverticulitis with sigmoid wall thickening.  Treated with antibiotics. ? ?CT Abdo/pelvis without contrast 06/2019 ?-Bilateral nephrolithiasis. 4 mm proximal right ureteral stone. No ?hydronephrosis. ?-Sigmoid diverticulosis.  No active diverticulitis. ? ?Colonoscopy 07/2017 ?- Rectal polyp status post polypectomy. Bx-hyperplastic ?- Moderate sigmoid  diverticulosis with mild associated colitis likely SCAD ( bx- neg) ?- Internal and external hemorrhoids (Grade IV). ? ?Wt Readings from Last 3 Encounters:  ?05/30/21 298 lb (135.2 kg)  ?03/27/21 298 lb 4 oz (135.3 kg)  ?02/24/21 300 lb (136.1 kg)  ? ? ? ?Past Medical History:  ?Diagnosis Date  ? Allergy   ? Asthma mild  ? no inhaler  ? Gallbladder polyp 2018  ? 30m polyp  ? GERD (gastroesophageal reflux disease) occasional  ? watches diet and takes diet  ? Hemorrhoids   ? h/o Cscope   ? History of MRSA infection few yrs ago while in [place] ? Meniscus tear 08/2010  ? to have surgery-- left knee  ? OSA on CPAP   ? Sleep apnea   ? Urolithiasis 2007-2008  ? [place] kidney  3 surgeries ; has passed stones since then   ? ? ?Past Surgical History:  ?Procedure Laterality Date  ? CYSTOSCOPY/RETROGRADE/URETEROSCOPY/STONE EXTRACTION WITH BASKET  2007-- x2  ? KNEE ARTHROSCOPY  12/05/2010  ? Procedure: ARTHROSCOPY KNEE;  Surgeon: JLaurice RecordAplington;  [facility]  Service: Orthopedics;  Laterality: Left;  LEFT KNEE ARTHROSCOPY WITH PARTIAL MEDIAL  MENISECTOMY, Total lateral menisectomy, Shaving of Medial Femoral condyle  ? leg surgery  123 age 56 ? Left , s/p traction, body cast  ? ? ?Family History  ?Problem Relation Age of Onset  ? Stroke Mother   ?     M d/t brain tumor   ? Cancer Mother   ?     M brain tumor  ? Coronary artery disease Father   ?  F, MI 72 y/o  ? Colon cancer Brother 12  ?     dx 12-2020  ? Colon cancer Maternal Grandmother   ? Diabetes Neg Hx   ? Prostate cancer Neg Hx   ? Esophageal cancer Neg Hx   ? ? ?Social History  ? ?Tobacco Use  ? Smoking status: Former  ?  Types: Cigarettes  ?  Quit date: 08/2018  ?  Years since quitting: 2.7  ? Smokeless tobacco: Never  ? Tobacco comments:  ?  Smoke on-off  ?  1 ppd age 21 to 77 =16 pack-year  ?  1 ppd 44-50 = 6 pack-year   ?  Total: 22 pack-year  ?  Quit 2020  ?Vaping Use  ? Vaping Use: Never used  ?Substance Use Topics   ? Alcohol use: Not Currently  ?  Comment: None Since 2017 MB RN  ? Drug use: No  ? ? ?Current Outpatient Medications  ?Medication Sig Dispense Refill  ? aspirin EC 81 MG tablet Take 81 mg by mouth daily. Swallow whole.    ? gabapentin (NEURONTIN) 100 MG capsule Take 2 capsules (200 mg total) by mouth 2 (two) times daily. 120 capsule 5  ? Multiple Vitamin (MULTIVITAMIN) capsule Take 1 capsule by mouth daily.    ? Ascorbic Acid (VITAMIN C) 1000 MG tablet Take 1,000 mg by mouth daily.    ? calcium carbonate (OS-CAL - DOSED IN MG OF ELEMENTAL CALCIUM) 1250 (500 Ca) MG tablet Take 1 tablet by mouth.    ? co-enzyme Q-10 30 MG capsule Take 30 mg by mouth 3 (three) times daily.    ? dicyclomine (BENTYL) 10 MG capsule Take 1 capsule (10 mg total) by mouth 2 (two) times daily. (Patient not taking: Reported on 05/30/2021) 60 capsule 3  ? hydrocortisone (ANUSOL-HC) 25 MG suppository Place 1 suppository (25 mg total) rectally 2 (two) times daily as needed for hemorrhoids. 12 suppository 1  ? naproxen sodium (ALEVE) 220 MG tablet Take 220 mg by mouth 2 (two) times daily as needed (pain).    ? omega-3 acid ethyl esters (LOVAZA) 1 g capsule Take by mouth 2 (two) times daily.    ? zinc gluconate 50 MG tablet Take 50 mg by mouth daily.    ? ?Current Facility-Administered Medications  ?Medication Dose Route Frequency Provider Last Rate Last Admin  ? 0.9 %  sodium chloride infusion  500 mL Intravenous Once Jackquline Denmark, MD      ? ? ?Allergies  ?Allergen Reactions  ? Contrast Media [Iodinated Contrast Media] Hives  ? Ioxaglate Hives  ? Sulfa Antibiotics Hives  ? ? ?Review of Systems:  ?Constitutional: Denies fever, chills, diaphoresis, appetite change and fatigue.  ?HEENT: Denies photophobia, eye pain, redness, hearing loss, ear pain, congestion, sore throat, rhinorrhea, sneezing, mouth sores, neck pain, neck stiffness and tinnitus. Has allergies. ?Respiratory: Denies SOB, DOE, chest tightness,  and wheezing. Has  cough. ?Cardiovascular: Denies chest pain, palpitations and leg swelling.  ?Genitourinary: Has kidney stones ?Musculoskeletal: Denies myalgias, back pain, joint swelling, arthralgias and gait problem.  ?Skin: No rash.  ?Neurological: Denies dizziness, seizures, syncope, weakness, light-headedness, numbness and headaches.  ?Hematological: Denies adenopathy. Easy bruising, personal or family bleeding history  ?Psychiatric/Behavioral: Has anxiety due to his job , nodepression ? ?  ? ?Physical Exam:   ? ?BP 131/73   Pulse 81   Temp 98.6 ?F (37 ?C)   Ht '6\' 2"'$  (1.88 m)   Wt 298 lb (135.2 kg)  SpO2 96%   BMI 38.26 kg/m?  ?Filed Weights  ? 05/30/21 0933  ?Weight: 298 lb (135.2 kg)  ? ?Constitutional:  Well-developed, in no acute distress. ?Psychiatric: Normal mood and affect. Behavior is normal. ?HEENT: Pupils normal.  Conjunctivae are normal. No scleral icterus. ?Neck supple.  ?Cardiovascular: Normal rate, regular rhythm. No edema ?Pulmonary/chest: Effort normal and breath sounds normal. No wheezing, rales or rhonchi. ?Abdominal: Soft, nondistended. Nontender. Bowel sounds active throughout. There are no masses palpable. No hepatomegaly. ?Rectal:  defered ?Neurological: Alert and oriented to person place and time. ?Skin: Skin is warm and dry. No rashes noted. ? ?Data Reviewed: I have personally reviewed following labs and imaging studies ? ?CBC: ? ?  Latest Ref Rng & Units 02/24/2021  ?  1:31 PM 06/06/2020  ?  2:43 PM 05/16/2020  ?  2:44 PM  ?CBC  ?WBC 4.0 - 10.5 K/uL 11.4   6.9   11.7    ?Hemoglobin 13.0 - 17.0 g/dL 14.0   14.4   14.2    ?Hematocrit 39.0 - 52.0 % 42.6   44.3   43.0    ?Platelets 150.0 - 400.0 K/uL 196.0   173   204.0    ? ? ?CMP: ? ?  Latest Ref Rng & Units 09/05/2020  ? 11:48 AM 06/06/2020  ?  2:43 PM 10/23/2019  ?  3:45 PM  ?CMP  ?Glucose 70 - 99 mg/dL  151   82    ?BUN 6 - 20 mg/dL  13   11    ?Creatinine 0.61 - 1.24 mg/dL  1.18   0.97    ?Sodium 135 - 145 mmol/L  138   139    ?Potassium 3.5 - 5.1  mmol/L  4.1   4.5    ?Chloride 98 - 111 mmol/L  99   103    ?CO2 22 - 32 mmol/L  33   28    ?Calcium 8.9 - 10.3 mg/dL  9.8   9.1    ?Total Protein 6.0 - 8.5 g/dL 7.0   7.3     ?Total Bilirubin 0.3 - 1.2 mg/dL  0.6     ?Alkaline Phos

## 2021-05-30 NOTE — Progress Notes (Signed)
Report to PACU, RN, vss, BBS= Clear.  

## 2021-05-30 NOTE — Op Note (Signed)
[facility] ?Patient Name: Mario Jimenez ?Procedure Date: 05/30/2021 10:00 AM ?MRN: [id number] ?Endoscopist: Jackquline Denmark , MD ?Age: 56 ?Referring MD:  ?Date of Birth: 1965-04-04 ?Gender: Male ?Account #: [id number] ?Procedure:                Colonoscopy ?Indications:              Screening patient at increased risk: FH colon ca  ?                          (age 77, brother with stage IV). H/O recurrent  ?                          diverticulitis. ?Medicines:                Monitored Anesthesia Care ?Procedure:                Pre-Anesthesia Assessment: ?                          - Prior to the procedure, a History and Physical  ?                          was performed, and patient medications and  ?                          allergies were reviewed. The patient's tolerance of  ?                          previous anesthesia was also reviewed. The risks  ?                          and benefits of the procedure and the sedation  ?                          options and risks were discussed with the patient.  ?                          All questions were answered, and informed consent  ?                          was obtained. Prior Anticoagulants: The patient has  ?                          taken no previous anticoagulant or antiplatelet  ?                          agents. ASA Grade Assessment: III - A patient with  ?                          severe systemic disease. After reviewing the risks  ?                          and benefits, the patient was deemed in  ?  satisfactory condition to undergo the procedure. ?                          After obtaining informed consent, the colonoscope  ?                          was passed under direct vision. Throughout the  ?                          procedure, the patient's blood pressure, pulse, and  ?                          oxygen saturations were monitored continuously. The  ?                          Colonoscope was introduced through the anus with   ?                          the intention of advancing to the cecum. Adult  ?                          scope could only be passed to mid sigmoid colon. We  ?                          switched to pediatric scope. The PCF scope was  ?                          advanced to the descending colon before the  ?                          procedure was aborted. Medications were given. The  ?                          colonoscopy was performed with moderate difficulty  ?                          due to restricted mobility of the colon,  ?                          significant looping and the patient's body habitus.  ?                          The patient tolerated the procedure well. The  ?                          quality of the bowel preparation was good. ?Scope In: 10:05:41 AM ?Scope Out: 10:23:46 AM ?Total Procedure Duration: 0 hours 18 minutes 5 seconds  ?Findings:                 Difficult exam d/t "fixed sigmoid colon" and  ?                          sigmoid stricture. Multiple small and large-mouthed  ?  diverticula were found in the sigmoid colon. The  ?                          adult scope CF could not be passed beyond mid  ?                          sigmoid. We switched to pediatric colonoscope which  ?                          was passed with some difficulty to mid descending  ?                          colon. Also due to formation of loops, it was not  ?                          safe to pass the scope further. Hence we decided to  ?                          hold off. ?                          Non-bleeding external and internal hemorrhoids were  ?                          found during retroflexion and during perianal exam.  ?                          The hemorrhoids were severe and Grade IV (internal  ?                          hemorrhoids that prolapse and cannot be reduced  ?                          manually). ?Complications:            No immediate complications. ?Estimated Blood Loss:      Estimated blood loss: none. ?Impression:               - Moderate to severe sigmoid diverticulosis with  ?                          "fixed" sigmoid colon with likely sigmoid stricture. ?                          - Moderate to severe external and internal  ?                          hemorrhoids. ?                          - No specimens collected. ?Recommendation:           - Patient has a contact number available for  ?                          emergencies.  The signs and symptoms of potential  ?                          delayed complications were discussed with the  ?                          patient. Return to normal activities tomorrow.  ?                          Written discharge instructions were provided to the  ?                          patient. ?                          - High fiber diet. ?                          - Continue present medications. ?                          - Proceed with Virtual colonoscopy (CT colography) ?                          - Would recommend surgical consultation for  ?                          hemorrhoidectomy and possible sigmoid colectomy d/t  ?                          recurrent diverticulitis. ?                          - The findings and recommendations were discussed  ?                          with the patient. ?Jackquline Denmark, MD ?05/30/2021 10:35:18 AM ?This report has been signed electronically. ?

## 2021-05-30 NOTE — Patient Instructions (Signed)
Handouts provided on diverticulosis, hemorrhoids and high-fiber diet.  ? ?Recommend a high-fiber diet.  ? ?Would recommend surgical consultation for hemorrhoidectomy and possible sigmoid colectomy due to recurrent diverticulitis.  ?Proceed with Virtual colonoscopy (CT colography).  ? ?YOU HAD AN ENDOSCOPIC PROCEDURE TODAY AT [facility]:   Refer to the procedure report that was given to you for any specific questions about what was found during the examination.  If the procedure report does not answer your questions, please call your gastroenterologist to clarify.  If you requested that your care partner not be given the details of your procedure findings, then the procedure report has been included in a sealed envelope for you to review at your convenience later. ? ?YOU SHOULD EXPECT: Some feelings of bloating in the abdomen. Passage of more gas than usual.  Walking can help get rid of the air that was put into your GI tract during the procedure and reduce the bloating. If you had a lower endoscopy (such as a colonoscopy or flexible sigmoidoscopy) you may notice spotting of blood in your stool or on the toilet paper. If you underwent a bowel prep for your procedure, you may not have a normal bowel movement for a few days. ? ?Please Note:  You might notice some irritation and congestion in your nose or some drainage.  This is from the oxygen used during your procedure.  There is no need for concern and it should clear up in a day or so. ? ?SYMPTOMS TO REPORT IMMEDIATELY: ? ?Following lower endoscopy (colonoscopy or flexible sigmoidoscopy): ? Excessive amounts of blood in the stool ? Significant tenderness or worsening of abdominal pains ? Swelling of the abdomen that is new, acute ? Fever of 100?F or higher ? ?For urgent or emergent issues, a gastroenterologist can be reached at any hour by calling [phone]. ?Do not use MyChart messaging for urgent concerns.  ? ? ?DIET:  We do recommend a  small meal at first, but then you may proceed to your regular diet.  Drink plenty of fluids but you should avoid alcoholic beverages for 24 hours. ? ?ACTIVITY:  You should plan to take it easy for the rest of today and you should NOT DRIVE or use heavy machinery until tomorrow (because of the sedation medicines used during the test).   ? ?FOLLOW UP: ?Our staff will call the number listed on your records 48-72 hours following your procedure to check on you and address any questions or concerns that you may have regarding the information given to you following your procedure. If we do not reach you, we will leave a message.  We will attempt to reach you two times.  During this call, we will ask if you have developed any symptoms of COVID 19. If you develop any symptoms (ie: fever, flu-like symptoms, shortness of breath, cough etc.) before then, please call [phone].  If you test positive for Covid 19 in the 2 weeks post procedure, please call and report this information to [place].   ? ?If any biopsies were taken you will be contacted by phone or by letter within the next 1-3 weeks.  Please call us at [phone] if you have not heard about the biopsies in 3 weeks.  ? ? ?SIGNATURES/CONFIDENTIALITY: ?You and/or your care partner have signed paperwork which will be entered into your electronic medical record.  These signatures attest to the fact that that the information above on your After Visit Summary has been reviewed  and is understood.  Full responsibility of the confidentiality of this discharge information lies with you and/or your care-partner. ? ?

## 2021-06-01 ENCOUNTER — Other Ambulatory Visit: Payer: Self-pay

## 2021-06-01 ENCOUNTER — Telehealth: Payer: Self-pay

## 2021-06-01 ENCOUNTER — Telehealth: Payer: Self-pay | Admitting: *Deleted

## 2021-06-01 DIAGNOSIS — K5732 Diverticulitis of large intestine without perforation or abscess without bleeding: Secondary | ICD-10-CM

## 2021-06-01 DIAGNOSIS — Z800 Family history of malignant neoplasm of digestive organs: Secondary | ICD-10-CM

## 2021-06-01 DIAGNOSIS — K56699 Other intestinal obstruction unspecified as to partial versus complete obstruction: Secondary | ICD-10-CM

## 2021-06-01 DIAGNOSIS — K5730 Diverticulosis of large intestine without perforation or abscess without bleeding: Secondary | ICD-10-CM

## 2021-06-01 NOTE — Telephone Encounter (Signed)
  Follow up Call-     05/30/2021    9:35 AM  Call back number  Post procedure Call Back phone  # [phone]  Permission to leave phone message Yes     Patient questions:  Do you have a fever, pain , or abdominal swelling? No. Pain Score  3 *  Have you tolerated food without any problems? Yes.    Have you been able to return to your normal activities? Yes.    Do you have any questions about your discharge instructions: Diet   No. Medications  No. Follow up visit  No.  Do you have questions or concerns about your Care? Yes.    Actions: * If pain score is 4 or above: No action needed, pain <4.  Patient stated that he had abdominal pain.  He came in for abdominal pain.  Ct colography to be done to determine cause of pain.

## 2021-06-01 NOTE — Telephone Encounter (Signed)
Left message for pt to call back  °

## 2021-06-01 NOTE — Telephone Encounter (Signed)
Left message on follow up call. 

## 2021-06-02 NOTE — Telephone Encounter (Signed)
Off of recommendations from Recent Colonoscopy Pt was scheduled for a Virtual  Colonoscopy at [facility]: Pt made aware there office will contact him: Referral along with pt records were sent to [facility] for surgical consultation  for hemorrhoidectomy and possible sigmoid colectomy d/t recurrent diverticulitis: Pt made aware that there office will contact him far an appointment: Pt notified to call our office back if he has not heard anything in two weeks in regard to the Virtual Colonoscopy or the Referral to [facility]: Pt verbalized understanding with all questions answered.

## 2021-06-07 ENCOUNTER — Telehealth: Payer: Self-pay | Admitting: Internal Medicine

## 2021-06-07 DIAGNOSIS — K824 Cholesterolosis of gallbladder: Secondary | ICD-10-CM

## 2021-06-07 NOTE — Telephone Encounter (Signed)
Chart reviewed- Pt was supposed to have US abdomen to f/u on gallbladder polyps in 02/2021- order placed.

## 2021-06-07 NOTE — Telephone Encounter (Signed)
Patient would like his ultrasound order to be sent to [facility] instead. Please advise.

## 2021-06-07 NOTE — Addendum Note (Signed)
Addended byDamita Dunnings D on: 06/07/2021 09:28 AM   Modules accepted: Orders

## 2021-06-30 ENCOUNTER — Other Ambulatory Visit: Payer: Self-pay

## 2021-06-30 ENCOUNTER — Emergency Department (HOSPITAL_BASED_OUTPATIENT_CLINIC_OR_DEPARTMENT_OTHER)
Admission: EM | Admit: 2021-06-30 | Discharge: 2021-06-30 | Disposition: A | Discharge status: Home / Self Care | Payer: 59 | Attending: Emergency Medicine | Admitting: Emergency Medicine

## 2021-06-30 ENCOUNTER — Emergency Department (HOSPITAL_BASED_OUTPATIENT_CLINIC_OR_DEPARTMENT_OTHER): Payer: 59

## 2021-06-30 ENCOUNTER — Ambulatory Visit: Payer: 59 | Admitting: Medical

## 2021-06-30 ENCOUNTER — Encounter (HOSPITAL_BASED_OUTPATIENT_CLINIC_OR_DEPARTMENT_OTHER): Payer: Self-pay

## 2021-06-30 VITALS — BP 140/77 | HR 100 | Resp 18 | Ht 74.0 in | Wt 305.0 lb

## 2021-06-30 DIAGNOSIS — Z7982 Long term (current) use of aspirin: Secondary | ICD-10-CM | POA: Insufficient documentation

## 2021-06-30 DIAGNOSIS — R0602 Shortness of breath: Secondary | ICD-10-CM | POA: Insufficient documentation

## 2021-06-30 DIAGNOSIS — R1011 Right upper quadrant pain: Secondary | ICD-10-CM | POA: Diagnosis not present

## 2021-06-30 DIAGNOSIS — R1013 Epigastric pain: Secondary | ICD-10-CM | POA: Diagnosis present

## 2021-06-30 DIAGNOSIS — F17200 Nicotine dependence, unspecified, uncomplicated: Secondary | ICD-10-CM

## 2021-06-30 DIAGNOSIS — Z122 Encounter for screening for malignant neoplasm of respiratory organs: Secondary | ICD-10-CM | POA: Diagnosis not present

## 2021-06-30 DIAGNOSIS — R944 Abnormal results of kidney function studies: Secondary | ICD-10-CM | POA: Diagnosis not present

## 2021-06-30 DIAGNOSIS — R0789 Other chest pain: Secondary | ICD-10-CM

## 2021-06-30 DIAGNOSIS — D72829 Elevated white blood cell count, unspecified: Secondary | ICD-10-CM | POA: Diagnosis not present

## 2021-06-30 DIAGNOSIS — Z87891 Personal history of nicotine dependence: Secondary | ICD-10-CM | POA: Insufficient documentation

## 2021-06-30 LAB — CBC
HCT: 42.6 % (ref 39.0–52.0)
Hemoglobin: 13.9 g/dL (ref 13.0–17.0)
MCH: 29.1 pg (ref 26.0–34.0)
MCHC: 32.6 g/dL (ref 30.0–36.0)
MCV: 89.1 fL (ref 80.0–100.0)
Platelets: 191 10*3/uL (ref 150–400)
RBC: 4.78 MIL/uL (ref 4.22–5.81)
RDW: 13.8 % (ref 11.5–15.5)
WBC: 12.4 10*3/uL — ABNORMAL HIGH (ref 4.0–10.5)
nRBC: 0 % (ref 0.0–0.2)

## 2021-06-30 LAB — COMPREHENSIVE METABOLIC PANEL
ALT: 20 U/L (ref 0–44)
AST: 28 U/L (ref 15–41)
Albumin: 3.8 g/dL (ref 3.5–5.0)
Alkaline Phosphatase: 66 U/L (ref 38–126)
Anion gap: 6 (ref 5–15)
BUN: 14 mg/dL (ref 6–20)
CO2: 26 mmol/L (ref 22–32)
Calcium: 9 mg/dL (ref 8.9–10.3)
Chloride: 108 mmol/L (ref 98–111)
Creatinine, Ser: 1.25 mg/dL — ABNORMAL HIGH (ref 0.61–1.24)
GFR, Estimated: >60 mL/min (ref >60)
Glucose, Bld: 92 mg/dL (ref 70–99)
Potassium: 3.9 mmol/L (ref 3.5–5.1)
Sodium: 140 mmol/L (ref 135–145)
Total Bilirubin: 0.6 mg/dL (ref 0.3–1.2)
Total Protein: 7.7 g/dL (ref 6.5–8.1)

## 2021-06-30 LAB — LIPASE, BLOOD: Lipase: 40 U/L (ref 11–51)

## 2021-06-30 LAB — TROPONIN I (HIGH SENSITIVITY)
Troponin I (High Sensitivity): 7 ng/L (ref <18)
Troponin I (High Sensitivity): 4 ng/L (ref <18)

## 2021-06-30 MED ORDER — ALUM & MAG HYDROXIDE-SIMETH 200-200-20 MG/5ML PO SUSP
30.0000 mL | Freq: Once | ORAL | Status: AC
  Administered 2021-06-30: 30 mL via ORAL
  Filled 2021-06-30: qty 30

## 2021-06-30 MED ORDER — LIDOCAINE VISCOUS HCL 2 % MT SOLN
15.0000 mL | Freq: Once | OROMUCOSAL | Status: AC
  Administered 2021-06-30: 15 mL via ORAL
  Filled 2021-06-30: qty 15

## 2021-06-30 MED ORDER — PANTOPRAZOLE SODIUM 20 MG PO TBEC: 20.0000 mg | DELAYED_RELEASE_TABLET | Freq: Every day | ORAL | 0 refills | Status: DC

## 2021-06-30 NOTE — Progress Notes (Signed)
Subjective:    Patient ID: Mario Jimenez, male    DOB: 1965-07-04, 56 y.o.   MRN: [id number]  HPI  Pt in reporting having some sharp dull pains end of sternum region. He has this for 2 weeks. Earlier today had pain in this area that started around 10 am. Pain level 4/10.  When pt states above pain can last 5-10 minutes usually. More constant today. Today seem more with activity. Lasting now for 6 hours.  Pain 3-4 days each week for past 2 weeks pain. Eating does excacerbated pain. No cough. No injury or accident.   Pt dad had ruptured aortic aneurysm.  High cholesterol mild and recent mid bp elevation. 140/98 last week. Hx of some chronic mild pedal edema.   Smoked 45-40 yr old and then   9-50 yr old. Pack a day when did smoke.   Review of Systems  Constitutional:  Negative for chills, fatigue and fever.  Respiratory:  Negative for cough, chest tightness, shortness of breath and wheezing.   Cardiovascular:  Positive for chest pain. Negative for palpitations.  Gastrointestinal:  Positive for abdominal pain. Negative for rectal pain and vomiting.  Musculoskeletal:  Positive for back pain. Negative for joint swelling and neck stiffness.       Some back pain.  Skin:  Negative for rash.  Neurological:  Negative for dizziness, speech difficulty, weakness and light-headedness.  Hematological:  Negative for adenopathy. Does not bruise/bleed easily.  Psychiatric/Behavioral:  Negative for behavioral problems and confusion. The patient is not nervous/anxious.     Past Medical History:  Diagnosis Date   Allergy    Asthma mild   no inhaler   Gallbladder polyp 2018   38m polyp   GERD (gastroesophageal reflux disease) occasional   watches diet and takes diet   Hemorrhoids    h/o Cscope    History of MRSA infection few yrs ago while in hospital in [place]   Meniscus tear 08/2010   to have surgery-- left knee   OSA on CPAP    Sleep apnea    Urolithiasis 2007-2008   ([place]) left  kidney  3 surgeries ; has passed stones since then      Social History   Socioeconomic History   Marital status: Single    Spouse name: Not on file   Number of children: 0   Years of education: Not on file   Highest education level: Not on file  Occupational History   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 Tobacco Use   Smoking status: Former    Types: Cigarettes    Quit date: 08/2018    Years since quitting: 2.8   Smokeless tobacco: Never   Tobacco comments:    Smoke on-off    1 ppd [age] pack-year    1 ppd [age] = 6 pack-year     Total: 22 pack-year    Quit 2020  Vaping Use   Vaping Use: Never used  Substance and Sexual Activity   Alcohol use: Not Currently    Comment: None Since 2017 MB RN   Drug use: No   Sexual activity: Not on file  Other Topics Concern   Not on file  Social History Narrative   From [place] [facility] [place] [facility]   Lives by himself    Right handed    Caffeine- 32 oz per day    Social Determinants of HRadio broadcast assistant  Strain: Not on file  Food Insecurity: Not on file  Transportation Needs: Not on file  Physical Activity: Not on file  Stress: Not on file  Social Connections: Not on file  Intimate Partner Violence: Not on file    Past Surgical History:  Procedure Laterality Date   CYSTOSCOPY/RETROGRADE/URETEROSCOPY/STONE EXTRACTION WITH BASKET  2007-- x2   KNEE ARTHROSCOPY  12/05/2010   Procedure: ARTHROSCOPY KNEE;  Surgeon: Jeneen Rinks P [place];  Location: [facility];  Service: Orthopedics;  Laterality: Left;  LEFT KNEE ARTHROSCOPY WITH PARTIAL MEDIAL  MENISECTOMY, Total lateral menisectomy, Shaving of Medial Femoral condyle   leg surgery  1977- age 56   Left , s/p traction, body cast    Family History  Problem Relation Age of Onset   Stroke Mother        M d/t brain tumor    Cancer Mother        M brain tumor   Coronary artery disease Father        F, MI 97 y/o   Colon cancer  Brother 62       dx 12-2020   Colon cancer Maternal Grandmother    Diabetes Neg Hx    Prostate cancer Neg Hx    Esophageal cancer Neg Hx     Allergies  Allergen Reactions   Contrast Media [Iodinated Contrast Media] Hives   Ioxaglate Hives   Sulfa Antibiotics Hives    Current Outpatient Medications on File Prior to Visit  Medication Sig Dispense Refill   Ascorbic Acid (VITAMIN C) 1000 MG tablet Take 1,000 mg by mouth daily.     aspirin EC 81 MG tablet Take 81 mg by mouth daily. Swallow whole.     calcium carbonate (OS-CAL - DOSED IN MG OF ELEMENTAL CALCIUM) 1250 (500 Ca) MG tablet Take 1 tablet by mouth.     co-enzyme Q-10 30 MG capsule Take 30 mg by mouth 3 (three) times daily.     dicyclomine (BENTYL) 10 MG capsule Take 1 capsule (10 mg total) by mouth 2 (two) times daily. 60 capsule 3   gabapentin (NEURONTIN) 100 MG capsule Take 2 capsules (200 mg total) by mouth 2 (two) times daily. 120 capsule 5   hydrocortisone (ANUSOL-HC) 25 MG suppository Place 1 suppository (25 mg total) rectally 2 (two) times daily as needed for hemorrhoids. 12 suppository 1   Multiple Vitamin (MULTIVITAMIN) capsule Take 1 capsule by mouth daily.     naproxen sodium (ALEVE) 220 MG tablet Take 220 mg by mouth 2 (two) times daily as needed (pain).     omega-3 acid ethyl esters (LOVAZA) 1 g capsule Take by mouth 2 (two) times daily.     zinc gluconate 50 MG tablet Take 50 mg by mouth daily.     No current facility-administered medications on file prior to visit.    BP 140/77   Pulse 100   Resp 18   Ht '6\' 2"'$  (1.88 m)   Wt (!) 305 lb (138.3 kg)   SpO2 94%   BMI 39.16 kg/m        Objective:   Physical Exam  General Mental Status- Alert. General Appearance- Not in acute distress.   Skin General: Color- Normal Color. Moisture- Normal Moisture.  Neck Carotid Arteries- Normal color. Moisture- Normal Moisture. No carotid bruits. No JVD.  Chest and Lung Exam Auscultation: Breath  Sounds:-Normal.  Cardiovascular Auscultation:Rythm- Regular. Murmurs & Other Heart Sounds:Auscultation of the heart reveals- No Murmurs.  Abdomen Inspection:-Inspeection Normal. Palpation/Percussion:Note:No mass.  Palpation and Percussion of the abdomen reveal- mild-moderate Tender, Non Distended + BS, no rebound or guarding.   Neurologic Cranial Nerve exam:- CN III-XII intact(No nystagmus), symmetric smile. Drift Test:- No drift. Romberg Exam:- Negative.  Heal to Toe Gait exam:-Normal. Finger to Nose:- Normal/Intact Strength:- 5/5 equal and symmetric strength both upper and lower extremities.       Assessment & Plan:   Patient Instructions  Recent 2 weeks of lower chest/epigastric area pain occurring 3 to 4 days out of each week.  Typically pain lasts 5 to 10 minutes and not associated with eating.  Today the pain has been present for 6 hours and you note some pain mildly worse with activity.  Some mild epigastric and right upper quadrant tenderness to palpation.  EKG showed sinus rhythm with no ischemia.  Family history of ruptured aortic aneurysm though on your previous CT no aneurysm noted.  History of smoking 22 years pack per day.  Mild triglyceride elevation and recent mild blood pressure elevation.  As we approach  weekend and with above presentation, risk factors and family history do think is best for you to be seen in the emergency department for further work-up which could include labs such as CBC, CMP lipase troponin and cardiac monitoring.  Work-up will be determined by the emergency department MD.  I did talk with EDMD and notified him of your presentation.  Follow-up date to be determined after emergency department work-up reviewed.   Mackie Pai, PA-C   Time spent with patient today was 40  minutes which consisted of chart revdiew, discussing diagnosis, work up treatment and documentation.

## 2021-06-30 NOTE — ED Triage Notes (Signed)
C/o intermittent epigastric/right sided chest pain x 2 weeks. States worse today since 0900. Intermittent pain, slight sob. Sent here by [employer] PA.

## 2021-06-30 NOTE — Patient Instructions (Addendum)
Recent 2 weeks of lower chest/epigastric area pain occurring 3 to 4 days out of each week.  Typically pain lasts 5 to 10 minutes and not associated with eating.  Today the pain has been present for 6 hours and you note some pain mildly worse with activity.  Some mild epigastric and right upper quadrant tenderness to palpation.  EKG showed sinus rhythm with no ischemia.  Family history of ruptured aortic aneurysm though on your previous CT no aneurysm noted.  History of smoking 22 years pack per day.  Mild triglyceride elevation and recent mild blood pressure elevation.  As we approach  weekend and with above presentation, risk factors and family history do think is best for you to be seen in the emergency department for further work-up which could include labs such as CBC, CMP lipase troponin and cardiac monitoring.  Work-up will be determined by the emergency department MD.  I did talk with EDMD and notified him of your presentation.  Follow-up date to be determined after emergency department work-up reviewed.

## 2021-06-30 NOTE — ED Provider Notes (Signed)
[facility] EM[facility]ENC[facility] DEPARTMENT Provider Note   CSN: [id number] Arrival date & time: 06/30/21  1651     History  Chief Complaint  Patient presents with   Chest Pain    Mario Jimenez is a 56 y.o. male.  Patient is a 56 year old male who presents with chest pain.  He describes an intermittent pain in his epigastric area and right upper abdomen.  He says been going on about 2 weeks.  Its been intermittent in usually last about 10 to 30 minutes at a time.  He does not have associated symptoms.  He does say occasionally he will get shortness of breath but not frequently.  No nausea or vomiting.  No diaphoresis.  No history of known prior heart problems.  He said it does seem to be worse when he is more active.  It does not seem to be worse after eating.  He has not taken any medications for it.  He is a prior smoker but quit 3 years ago.       Home Medications Prior to Admission medications   Medication Sig Start Date End Date Taking? Authorizing Provider  pantoprazole (PROTONIX) 20 MG tablet Take 1 tablet (20 mg total) by mouth daily. 06/30/21  Yes Malvin Johns, MD  Ascorbic Acid (VITAMIN C) 1000 MG tablet Take 1,000 mg by mouth daily.    [provider]  aspirin EC 81 MG tablet Take 81 mg by mouth daily. Swallow whole.    [provider]  calcium carbonate (OS-CAL - DOSED IN MG OF ELEMENTAL CALCIUM) 1250 (500 Ca) MG tablet Take 1 tablet by mouth.    [provider]  co-enzyme Q-10 30 MG capsule Take 30 mg by mouth 3 (three) times daily.    [provider]  dicyclomine (BENTYL) 10 MG capsule Take 1 capsule (10 mg total) by mouth 2 (two) times daily. 03/27/21   Jackquline Denmark, MD  gabapentin (NEURONTIN) 100 MG capsule Take 2 capsules (200 mg total) by mouth 2 (two) times daily. 05/04/21   Colon Branch, MD  hydrocortisone (ANUSOL-HC) 25 MG suppository Place 1 suppository (25 mg total) rectally 2 (two) times daily as needed for hemorrhoids.  02/24/21   Colon Branch, MD  Multiple Vitamin (MULTIVITAMIN) capsule Take 1 capsule by mouth daily.    [provider]  naproxen sodium (ALEVE) 220 MG tablet Take 220 mg by mouth 2 (two) times daily as needed (pain).    [provider]  omega-3 acid ethyl esters (LOVAZA) 1 g capsule Take by mouth 2 (two) times daily.    [provider]  zinc gluconate 50 MG tablet Take 50 mg by mouth daily.    [provider]      Allergies    Contrast media [iodinated contrast media], Ioxaglate, and Sulfa antibiotics    Review of Systems   Review of Systems  Constitutional:  Negative for chills, diaphoresis, fatigue and fever.  HENT:  Negative for congestion, rhinorrhea and sneezing.   Eyes: Negative.   Respiratory:  Positive for shortness of breath. Negative for cough and chest tightness.   Cardiovascular:  Positive for chest pain. Negative for leg swelling.  Gastrointestinal:  Positive for abdominal pain. Negative for blood in stool, diarrhea, nausea and vomiting.  Genitourinary:  Negative for difficulty urinating, flank pain, frequency and hematuria.  Musculoskeletal:  Negative for arthralgias and back pain.  Skin:  Negative for rash.  Neurological:  Negative for dizziness, speech difficulty, weakness, numbness and  headaches.    Physical Exam Updated Vital Signs BP 131/89   Pulse 80   Temp 99 F (37.2 C) (Oral)   Resp 18   Ht '6\' 1"'$  (1.854 m)   Wt (!) 138.3 kg   SpO2 99%   BMI 40.24 kg/m  Physical Exam Constitutional:      Appearance: He is well-developed.  HENT:     Head: Normocephalic and atraumatic.  Eyes:     Pupils: Pupils are equal, round, and reactive to light.  Cardiovascular:     Rate and Rhythm: Normal rate and regular rhythm.     Heart sounds: Normal heart sounds.  Pulmonary:     Effort: Pulmonary effort is normal. No respiratory distress.     Breath sounds: Normal breath sounds. No wheezing or rales.  Chest:     Chest wall: No  tenderness.  Abdominal:     General: Bowel sounds are normal.     Palpations: Abdomen is soft.     Tenderness: There is abdominal tenderness (Tenderness to the epigastrium and right upper quadrant). There is no guarding or rebound.  Musculoskeletal:        General: Normal range of motion.     Cervical back: Normal range of motion and neck supple.  Lymphadenopathy:     Cervical: No cervical adenopathy.  Skin:    General: Skin is warm and dry.     Findings: No rash.  Neurological:     Mental Status: He is alert and oriented to person, place, and time.     ED Results / Procedures / Treatments   Labs (all labs ordered are listed, but only abnormal results are displayed) Labs Reviewed  CBC - Abnormal; Notable for the following components:      Result Value   WBC 12.4 (*)    All other components within normal limits  COMPREHENSIVE METABOLIC PANEL - Abnormal; Notable for the following components:   Creatinine, Ser 1.25 (*)    All other components within normal limits  LIPASE, BLOOD  TROPONIN I (HIGH SENSITIVITY)  TROPONIN I (HIGH SENSITIVITY)    EKG EKG Interpretation  Date/Time:  Friday June 30 2021 16:58:01 EDT Ventricular Rate:  93 PR Interval:  146 QRS Duration: 92 QT Interval:  348 QTC Calculation: 432 R Axis:   51 Text Interpretation: Normal sinus rhythm Normal ECG No previous ECGs available Confirmed by Malvin Johns [phone]) on 06/30/2021 6:20:54 PM  Radiology US Abdomen Limited RUQ (LIVER/GB)  Result Date: 06/30/2021 CLINICAL DATA:  Right upper quadrant pain. EXAM: ULTRASOUND ABDOMEN LIMITED RIGHT UPPER QUADRANT COMPARISON:  CT 07/14/2019, abdominal ultrasound 10/18/2016 FINDINGS: Gallbladder: Only partially distended. Gallbladder wall thickening of 5 mm is likely accentuated by nondistention. Gallbladder polyps with the largest being 6 mm, unchanged from 2018 exam. No gallstones. No pericholecystic fluid. No sonographic Murphy sign noted by sonographer. Common bile  duct: Diameter: 2-3 mm. Liver: No focal lesion identified. Heterogeneous in parenchymal echogenicity. Portal vein is patent on color Doppler imaging with normal direction of blood flow towards the liver. Other: No right upper quadrant ascites. IMPRESSION: 1. Contracted gallbladder which is nonspecific. This can be seen in the setting of chronic cholecystitis. Gallbladder wall thickness of 5 mm is likely accentuated by nondistention. 2. Stable gallbladder polyp dating back to 2018 exam. Given size and stability, no further follow-up is needed. No gallstones. Electronically Signed   By: Keith Rake M.D.   On: 06/30/2021 20:19   DG Chest 2 View  Result Date: 06/30/2021 CLINICAL  DATA:  Chest pain, shortness of breath EXAM: CHEST - 2 VIEW COMPARISON:  None Available. FINDINGS: Cardiac size is within normal limits. There are no signs of pulmonary edema or focal pulmonary consolidation. There is transverse linear density at the left cardiophrenic angle. There is no pleural effusion or pneumothorax. IMPRESSION: There are no signs of pulmonary edema or focal pulmonary consolidation. Left cardiophrenic angle is indistinct along with small linear density in the left lower lung fields which may suggest scarring or subsegmental atelectasis. Electronically Signed   By: Elmer Picker M.D.   On: 06/30/2021 17:31    Procedures Procedures    Medications Ordered in ED Medications  alum & mag hydroxide-simeth (MAALOX/MYLANTA) 200-200-20 MG/5ML suspension 30 mL (30 mLs Oral Given 06/30/21 1958)    And  lidocaine (XYLOCAINE) 2 % viscous mouth solution 15 mL (15 mLs Oral Given 06/30/21 1959)    ED Course/ Medical Decision Making/ A&P                           Medical Decision Making Amount and/or Complexity of Data Reviewed Labs: ordered. Radiology: ordered.  Risk OTC drugs. Prescription drug management.   Patient is a 56 year old male who presents with chest pain.  His pain seems to be actually more  epigastric and right upper quadrant.  He is tender on palpation.  His EKG does not show any ischemic changes.  He has had 2 negative troponins.  His labs are nonconcerning.  His creatinine is slightly elevated.  His WBC count is slightly elevated.  Given his symptoms, right upper quadrant ultrasound was ordered.  The gallbladder was contracted.  No gallstones were visualized.  No obvious signs of cholecystitis.  He is afebrile.  I suspect however that his symptoms may be coming from his gallbladder.  I encouraged him to have close follow-up with his primary care doctor.  If his symptoms continue, he may need a HIDA scan.  He did get some improvement in symptoms with a GI cocktail.  We will start him on Protonix and advised him to use a bland diet.  He was given instructions to follow-up with his primary care doctor early next week.  Return precautions were given.  Final Clinical Impression(s) / ED Diagnoses Final diagnoses:  Atypical chest pain  Epigastric pain    Rx / DC Orders ED Discharge Orders          Ordered    pantoprazole (PROTONIX) 20 MG tablet  Daily        06/30/21 2158              Malvin Johns, MD 06/30/21 2201

## 2021-06-30 NOTE — ED Notes (Signed)
Patient verbalizes understanding of discharge instructions. Opportunity for questioning and answers were provided. Armband removed by staff, pt discharged from ED. Ambulated out to lobby  

## 2021-06-30 NOTE — Discharge Instructions (Signed)
Follow-up with your primary care doctor this week for recheck.  Use a bland diet.  Take the Protonix as discussed.  Return to the emergency room if you have any worsening symptoms.

## 2021-06-30 NOTE — ED Notes (Signed)
Pt reporting relief of chest pain

## 2021-07-07 ENCOUNTER — Encounter: Payer: Self-pay | Admitting: Internal Medicine

## 2021-07-10 ENCOUNTER — Other Ambulatory Visit: Payer: Self-pay | Admitting: Internal Medicine

## 2021-07-10 MED ORDER — PANTOPRAZOLE SODIUM 20 MG PO TBEC: 20.0000 mg | DELAYED_RELEASE_TABLET | Freq: Every day | ORAL | 0 refills | Status: DC

## 2021-07-12 ENCOUNTER — Ambulatory Visit: Payer: 59 | Admitting: Internal Medicine

## 2021-07-12 ENCOUNTER — Encounter: Payer: Self-pay | Admitting: Internal Medicine

## 2021-07-12 VITALS — BP 128/78 | HR 94 | Temp 97.8°F | Resp 18 | Ht 73.0 in | Wt 308.8 lb

## 2021-07-12 DIAGNOSIS — E539 Vitamin B deficiency, unspecified: Secondary | ICD-10-CM

## 2021-07-12 DIAGNOSIS — K824 Cholesterolosis of gallbladder: Secondary | ICD-10-CM

## 2021-07-12 DIAGNOSIS — R1011 Right upper quadrant pain: Secondary | ICD-10-CM | POA: Diagnosis not present

## 2021-07-12 MED ORDER — GABAPENTIN 300 MG PO CAPS: 300.0000 mg | ORAL_CAPSULE | Freq: Two times a day (BID) | ORAL | 6 refills | Status: DC

## 2021-07-12 NOTE — Progress Notes (Unsigned)
Subjective:    Patient ID: Mario Jimenez, male    DOB: March 08, 1965, 56 y.o.   MRN: [id number]  DOS:  07/12/2021 Type of visit - description: ER f/u  Went to the ER 12 days ago. Presented with on and off pain for 3 weeks prior to the ER evaluation. Pain was located at the right anterior chest and right upper quadrant of the abdomen, on and off. He typically has his breakfast at 9 am and typically he had pain at that time. At the ER, symptoms improved with GI cocktail. Work-up:  Creatinine 1.2 slightly elevated but not far from baseline.  CBC with a white count of 12.4.  Lipase normal, troponin negative, chest x-ray no major findings except possibly subsegmental atelectasis RUQ abdomen: Contracted gallbladder.  Gallbladder polyp is stable.  No follow-up needed for that condition  Since he left the ER, pain have subsided. He has developed some heartburn but is now on PPIs.  Denies dysphagia or odynophagia. No fever chills No shortness of breath No nausea vomiting. Occasional blood per rectum, history of hemorrhoids. No gross hematuria.  Review of Systems See above   Past Medical History:  Diagnosis Date   Allergy    Asthma mild   no inhaler   Gallbladder polyp 2018   61m polyp   GERD (gastroesophageal reflux disease) occasional   watches diet and takes diet   Hemorrhoids    h/o Cscope    History of MRSA infection few yrs ago while in hospital in [place]   Meniscus tear 08/2010   to have surgery-- left knee   OSA on CPAP    Sleep apnea    Urolithiasis 2007-2008   ([place]) left kidney  3 surgeries ; has passed stones since then     Past Surgical History:  Procedure Laterality Date   CYSTOSCOPY/RETROGRADE/URETEROSCOPY/STONE EXTRACTION WITH BASKET  2007-- x2   KNEE ARTHROSCOPY  12/05/2010   Procedure: ARTHROSCOPY KNEE;  Surgeon: JJeneen RinksP [place];  [facility] [facility]  Service: Orthopedics;  Laterality: Left;  LEFT KNEE ARTHROSCOPY WITH PARTIAL MEDIAL   MENISECTOMY, Total lateral menisectomy, Shaving of Medial Femoral condyle   leg surgery  1977- age 89  Left , s/p traction, body cast    Current Outpatient Medications  Medication Instructions   aspirin EC 81 mg, Oral, Daily, Swallow whole.   calcium carbonate (OS-CAL - DOSED IN MG OF ELEMENTAL CALCIUM) 1250 (500 Ca) MG tablet 1 tablet, Oral   co-enzyme Q-10 30 mg, Oral, 3 times daily   dicyclomine (BENTYL) 10 mg, Oral, 2 times daily   gabapentin (NEURONTIN) 200 mg, Oral, 2 times daily   hydrocortisone (ANUSOL-HC) 25 mg, Rectal, 2 times daily PRN   Multiple Vitamin (MULTIVITAMIN) capsule 1 capsule, Oral, Daily   naproxen sodium (ALEVE) 220 mg, Oral, 2 times daily PRN   omega-3 acid ethyl esters (LOVAZA) 1 g capsule Oral, 2 times daily   pantoprazole (PROTONIX) 20 mg, Oral, Daily   vitamin C 1,000 mg, Oral, Daily   zinc gluconate 50 mg, Oral, Daily       Objective:   Physical Exam BP 128/78 (BP Location: Left Arm, Patient Position: Sitting, Cuff Size: Large)   Pulse 94   Temp 97.8 F (36.6 C) (Oral)   Resp 18   Ht '6\' 1"'$  (1.854 m)   Wt (!) 308 lb 12.8 oz (140.1 kg)   SpO2 96%   BMI 40.74 kg/m  General:   Well developed, NAD, BMI noted.  HEENT:  Normocephalic . Face symmetric, atraumatic.  No jaundice Lungs:  CTA B Normal respiratory effort, no intercostal retractions, no accessory muscle use. Heart: RRR,  no murmur.  Abdomen:  Not distended, soft, minimal tenderness at the RUQ of the abdomen without mass or rebound Skin: Not pale. Not jaundice Lower extremities: no pretibial edema bilaterally  Neurologic:  alert & oriented X3.  Speech normal, gait appropriate for age and unassisted Psych--  Cognition and judgment appear intact.  Cooperative with normal attention span and concentration.  Behavior appropriate. No anxious or depressed appearing.     Assessment     ASSESSMENT Asthma GERD Chronic sinusitis, Allergies Urolithiasis History of diverticulitis GB  polyp, saw surgery/2018, rec a [place] in 6 months. Surgery if polyp >  10 mm Smoker: quit 08/2018 Chronic sinusitis: Seen by ENT 2018, Dr Laurance Flatten, rx medical treatment, no polyps OSA dx 07/2019 Covid infection 12/2019, infusion 12/23/2019  PLAN: RUQ abdominal pain: Presented to the ER with abdominal pain and also some right-sided chest pain. Work-up and notes reviewed, suspect pain is related to the gallbladder. Recommend to keep the appointment he already has with surgery next month. Recheck a CMP, creatinine was slightly elevated Also recommend good hydration and dicyclomine if needed for pain. ER if symptoms severe, see AVS. Gallbladder polyps: Has a history of GB polyps, RUQ abdominal ultrasound was done at the ER and polyps were stable.  We will cancel the already schedule [place] for July 2023. Burning feet syndrome, perioral numbness: Request increase gabapentin, will increase to 300 mg twice daily.  Rx sent GERD: Heartburn restarted after the ER visit, now on PPIs. RTC already scheduled October 2023 CPE  2=== Constipation, red blood per rectum, H/oh hemorrhoids: Symptoms as described above, in addition his older brother was just dx w/  advanced colon cancer.  He is concerned, we agreed on a GI referral, consider early colonoscopy. Check a CBC to be sure he has no severe anemia.  Suppository sent for symptomatic treatment on what seems to be distal benign bleeding. Gallbladder polyps: Check ultrasound. Burning feet syndrome, perioral numbness: Saw neurology, was Rx gabapentin, it is helping.  Would like to change from TID to BID for convenience sake.  New prescription sent. Preventive care: Declines flu or COVID-vaccine RTC 10-2021 CPX

## 2021-07-12 NOTE — Patient Instructions (Addendum)
Increase gabapentin to 300 mg twice daily  Take dicyclomine to if you have mild pain on the right side of the abdomen  If you have fever, chills, severe pain on the right side of the abdomen: Go to the ER  Go to the lab for blood work  Next appointment with me is in October, see you then.

## 2021-07-13 LAB — COMPREHENSIVE METABOLIC PANEL
ALT: 15 U/L (ref 0–53)
AST: 23 U/L (ref 0–37)
Albumin: 4.2 g/dL (ref 3.5–5.2)
Alkaline Phosphatase: 75 U/L (ref 39–117)
BUN: 13 mg/dL (ref 6–23)
CO2: 28 mEq/L (ref 19–32)
Calcium: 9 mg/dL (ref 8.4–10.5)
Chloride: 102 mEq/L (ref 96–112)
Creatinine, Ser: 1.06 mg/dL (ref 0.40–1.50)
GFR: 79.99 mL/min (ref >60.00)
Glucose, Bld: 107 mg/dL — ABNORMAL HIGH (ref 70–99)
Potassium: 4 mEq/L (ref 3.5–5.1)
Sodium: 139 mEq/L (ref 135–145)
Total Bilirubin: 0.4 mg/dL (ref 0.2–1.2)
Total Protein: 7.5 g/dL (ref 6.0–8.3)

## 2021-07-13 NOTE — Assessment & Plan Note (Signed)
RUQ abdominal pain: Presented to the ER with abdominal pain and also some right-sided chest pain. Work-up and notes reviewed, suspect sxs related to the gallbladder. Recommend to keep f/u he has w/ surgery next month. Recheck a CMP, creatinine was slightly elevated Also recommend good hydration and dicyclomine if needed for pain. ER if symptoms severe, see AVS. Gallbladder polyps: Has a history of GB polyps and I was planning a follow-up US however the ultrasound done at the ER showed the polyps are stable thus will cancel the [place] scheduled for July 2023. Burning feet syndrome, perioral numbness: Request increase gabapentin, will increase to 300 mg twice daily.  Rx sent GERD: Heartburn restarted after the ER visit, now on PPIs. RTC already scheduled October 2023 CPE

## 2021-07-31 ENCOUNTER — Other Ambulatory Visit: Payer: 59

## 2021-07-31 ENCOUNTER — Ambulatory Visit
Admission: RE | Admit: 2021-07-31 | Discharge: 2021-07-31 | Disposition: A | Discharge status: Home / Self Care | Payer: 59 | Source: Ambulatory Visit | Attending: Gastroenterology | Admitting: Gastroenterology

## 2021-07-31 DIAGNOSIS — Z800 Family history of malignant neoplasm of digestive organs: Secondary | ICD-10-CM

## 2021-07-31 DIAGNOSIS — K5730 Diverticulosis of large intestine without perforation or abscess without bleeding: Secondary | ICD-10-CM

## 2021-07-31 DIAGNOSIS — K56699 Other intestinal obstruction unspecified as to partial versus complete obstruction: Secondary | ICD-10-CM

## 2021-08-18 ENCOUNTER — Telehealth: Payer: Self-pay | Admitting: Licensed Clinical Social Worker

## 2021-08-18 NOTE — Telephone Encounter (Signed)
Scheduled appt per 8/2 referral. Pt is aware of appt date and time. Pt is aware to arrive 15 mins prior to appt time and to bring and updated insurance card. Pt is aware of appt location.   

## 2021-08-24 ENCOUNTER — Telehealth: Payer: Self-pay | Admitting: Gastroenterology

## 2021-08-24 NOTE — Telephone Encounter (Signed)
Spoke to pt: Documented under result notes

## 2021-08-24 NOTE — Telephone Encounter (Signed)
Patient called to return your call. Please call to advise.  

## 2021-08-24 NOTE — Telephone Encounter (Signed)
Left message for pt to call back  °

## 2021-08-26 ENCOUNTER — Other Ambulatory Visit: Payer: Self-pay | Admitting: Internal Medicine

## 2021-09-11 ENCOUNTER — Other Ambulatory Visit (INDEPENDENT_AMBULATORY_CARE_PROVIDER_SITE_OTHER): Payer: 59

## 2021-09-11 ENCOUNTER — Encounter: Payer: Self-pay | Admitting: Gastroenterology

## 2021-09-11 ENCOUNTER — Ambulatory Visit: Payer: 59 | Admitting: Gastroenterology

## 2021-09-11 VITALS — BP 118/70 | HR 84 | Ht 71.75 in | Wt 299.2 lb

## 2021-09-11 DIAGNOSIS — K219 Gastro-esophageal reflux disease without esophagitis: Secondary | ICD-10-CM

## 2021-09-11 DIAGNOSIS — K56699 Other intestinal obstruction unspecified as to partial versus complete obstruction: Secondary | ICD-10-CM

## 2021-09-11 DIAGNOSIS — K581 Irritable bowel syndrome with constipation: Secondary | ICD-10-CM

## 2021-09-11 LAB — COMPREHENSIVE METABOLIC PANEL
ALT: 18 U/L (ref 0–53)
AST: 21 U/L (ref 0–37)
Albumin: 4.1 g/dL (ref 3.5–5.2)
Alkaline Phosphatase: 78 U/L (ref 39–117)
BUN: 16 mg/dL (ref 6–23)
CO2: 27 mEq/L (ref 19–32)
Calcium: 9 mg/dL (ref 8.4–10.5)
Chloride: 104 mEq/L (ref 96–112)
Creatinine, Ser: 1.03 mg/dL (ref 0.40–1.50)
GFR: 82.7 mL/min (ref >60.00)
Glucose, Bld: 96 mg/dL (ref 70–99)
Potassium: 4.8 mEq/L (ref 3.5–5.1)
Sodium: 137 mEq/L (ref 135–145)
Total Bilirubin: 0.5 mg/dL (ref 0.2–1.2)
Total Protein: 7.6 g/dL (ref 6.0–8.3)

## 2021-09-11 LAB — CBC WITH DIFFERENTIAL/PLATELET
Basophils Absolute: 0.1 10*3/uL (ref 0.0–0.1)
Basophils Relative: 0.8 % (ref 0.0–3.0)
Eosinophils Absolute: 0.1 10*3/uL (ref 0.0–0.7)
Eosinophils Relative: 1.6 % (ref 0.0–5.0)
HCT: 43.2 % (ref 39.0–52.0)
Hemoglobin: 14.5 g/dL (ref 13.0–17.0)
Lymphocytes Relative: 22.3 % (ref 12.0–46.0)
Lymphs Abs: 2 10*3/uL (ref 0.7–4.0)
MCHC: 33.5 g/dL (ref 30.0–36.0)
MCV: 87.8 fl (ref 78.0–100.0)
Monocytes Absolute: 0.4 10*3/uL (ref 0.1–1.0)
Monocytes Relative: 4.3 % (ref 3.0–12.0)
Neutro Abs: 6.2 10*3/uL (ref 1.4–7.7)
Neutrophils Relative %: 71 % (ref 43.0–77.0)
Platelets: 175 10*3/uL (ref 150.0–400.0)
RBC: 4.93 Mil/uL (ref 4.22–5.81)
RDW: 14.4 % (ref 11.5–15.5)
WBC: 8.8 10*3/uL (ref 4.0–10.5)

## 2021-09-11 NOTE — Progress Notes (Unsigned)
Chief Complaint: For colonoscopy  Referring Provider:  Colon Branch, MD      ASSESSMENT AND PLAN;   #1. FH colon ca (age 56, brother with stage IV)- new Dx  #2. Sigmoid div stricture. Seen Dr Dema Severin  #3. IBS-C  #4.  GERD (EGD 2006.  No Barrett's), well controlled on pepcid    Plan:  -Continue metamucil and prune juice -Add miralax 1 TSP po QD -CBC, CMP -FU in 6 months. Earlier if any bleeding or problems. -He will make FU with Dr Dema Severin.    HPI:    Mario Jimenez is a 56 y.o. male  With long-standing history of GI complaints  Brother  Dx with stage IV colon cancer at age 64  No bleeding Doing very well  Seen Dr Dema Severin, wants to hold off Sx   Occ abdominal discomfort-more or less generalized.  No diarrhea.  Has occasional constipation with pellet-like stools.  Better with increased water intake and fiber supplements.  He did notice some bright red blood per rectum which he attributes to hemorrhoids.  Note that he does have history of scad.  No upper GI symptoms including nausea, vomiting, heartburn, regurgitation, odynophagia or dysphagia.  No weight loss.  Wt Readings from Last 3 Encounters:  09/11/21 299 lb 4 oz (135.7 kg)  07/12/21 (!) 308 lb 12.8 oz (140.1 kg)  06/30/21 (!) 305 lb (138.3 kg)     Previous GI history/work-up:  H/O diverticulitis in 2006 -colonoscopy by Dr. Sharlett Iles revealed sigmoid diverticulosis with segmental colitis. CT 05/2017 showed sigmoid diverticulitis with sigmoid wall thickening.  Treated with antibiotics.  CT Abdo/pelvis without contrast 06/2019 -Bilateral nephrolithiasis. 4 mm proximal right ureteral stone. No hydronephrosis. -Sigmoid diverticulosis.  No active diverticulitis.  Colonoscopy 07/2017 - Rectal polyp status post polypectomy. Bx-hyperplastic - Moderate sigmoid diverticulosis with mild associated colitis likely SCAD ( bx- neg) - Internal and external hemorrhoids (Grade IV).  VC 07/2021 IMPRESSION: Severe  sigmoid diverticulosis. Circumferential wall thickening within the mid sigmoid colon may be related to muscular hypertrophy from diverticulosis, but cannot exclude annular constricting malignancy, especially given the several mildly prominent adjacent mesenteric lymph nodes. Recommend correlation colonoscopy/biopsy results.   Bilateral nephrolithiasis.  No hydronephrosis.     Electronically Signed   By: Rolm Baptise M.D.   On: 08/01/2021 12:53  Wt Readings from Last 3 Encounters:  09/11/21 299 lb 4 oz (135.7 kg)  07/12/21 (!) 308 lb 12.8 oz (140.1 kg)  06/30/21 (!) 305 lb (138.3 kg)     Past Medical History:  Diagnosis Date   Allergy    Asthma mild   no inhaler   Gallbladder polyp 2018   50m polyp   GERD (gastroesophageal reflux disease) occasional   watches diet and takes diet   Hemorrhoids    h/o Cscope    History of MRSA infection [place]rs ago [place]  Meniscus tear 08/2010   to have surgery-- left knee   OSA on CPAP    Sleep apnea    Urolithiasis 2007-2008   [place] left kidney  3 surgeries ; has passed stones since then     Past Surgical History:  Procedure Laterality Date   CYSTOSCOPY/RETROGRADE/URETEROSCOPY/STONE EXTRACTION WITH BASKET  2007-- x2   KNEE ARTHROSCOPY  12/05/2010   Procedure: ARTHROSCOPY KNEE;  Surgeon: JJeneen RinksP Aplington;  [facility]  Service: Orthopedics;  Laterality: Left;  LEFT KNEE ARTHROSCOPY WITH PARTIAL MEDIAL  MENISECTOMY, Total lateral menisectomy, Shaving  of Medial Femoral condyle   leg surgery  1977- age 79   Left , s/p traction, body cast    Family History  Problem Relation Age of Onset   Stroke Mother        M d/t brain tumor    Cancer Mother        M brain tumor   Coronary artery disease Father        F, MI 27 y/o   Colon cancer Brother 58       dx 12-2020   Colon cancer Maternal Grandmother    Diabetes Neg Hx    Prostate cancer Neg Hx    Esophageal cancer Neg Hx      Social History   Tobacco Use   Smoking status: Former    Types: Cigarettes    Quit date: 08/2018    Years since quitting: 3.0   Smokeless tobacco: Never   Tobacco comments:    Smoke on-off    1 ppd age 20 to 77 =16 pack-year    1 ppd [age] = 6 pack-year     Total: 22 pack-year    Quit 2020  Vaping Use   Vaping Use: Never used  Substance Use Topics   Alcohol use: Not Currently    Comment: None Since 2017 MB RN   Drug use: No    Current Outpatient Medications  Medication Sig Dispense Refill   Ascorbic Acid (VITAMIN C) 1000 MG tablet Take 1,000 mg by mouth daily.     aspirin EC 81 MG tablet Take 81 mg by mouth daily. Swallow whole.     bisacodyl (DULCOLAX) 5 MG EC tablet Take 5 mg by mouth as needed for moderate constipation.     calcium carbonate (OS-CAL - DOSED IN MG OF ELEMENTAL CALCIUM) 1250 (500 Ca) MG tablet Take 1 tablet by mouth.     co-enzyme Q-10 30 MG capsule Take 30 mg by mouth 3 (three) times daily.     docusate sodium (STOOL SOFTENER) 100 MG capsule Take 100 mg by mouth as needed for mild constipation.     FIBER PO Take 1 Dose by mouth daily.     gabapentin (NEURONTIN) 300 MG capsule Take 1 capsule (300 mg total) by mouth 2 (two) times daily. 60 capsule 6   hydrocortisone (ANUSOL-HC) 25 MG suppository Place 1 suppository (25 mg total) rectally 2 (two) times daily as needed for hemorrhoids. 12 suppository 1   Multiple Vitamin (MULTIVITAMIN) capsule Take 1 capsule by mouth daily.     naproxen sodium (ALEVE) 220 MG tablet Take 220 mg by mouth 2 (two) times daily as needed (pain).     omega-3 acid ethyl esters (LOVAZA) 1 g capsule Take by mouth 2 (two) times daily.     polyethylene glycol (MIRALAX / GLYCOLAX) 17 g packet Take 17 g by mouth daily.     zinc gluconate 50 MG tablet Take 50 mg by mouth daily.     dicyclomine (BENTYL) 10 MG capsule Take 1 capsule (10 mg total) by mouth 2 (two) times daily. (Patient not taking: Reported on 09/11/2021) 60 capsule 3    pantoprazole (PROTONIX) 20 MG tablet Take 1 tablet (20 mg total) by mouth daily. (Patient not taking: Reported on 09/11/2021) 30 tablet 0   No current facility-administered medications for this visit.    Allergies  Allergen Reactions   Contrast Media [Iodinated Contrast Media] Hives   Ioxaglate Hives    Contrast   Sulfa Antibiotics Hives  Review of Systems:  Constitutional: Denies fever, chills, diaphoresis, appetite change and fatigue.  HEENT: Denies photophobia, eye pain, redness, hearing loss, ear pain, congestion, sore throat, rhinorrhea, sneezing, mouth sores, neck pain, neck stiffness and tinnitus. Has allergies. Respiratory: Denies SOB, DOE, chest tightness,  and wheezing. Has cough. Cardiovascular: Denies chest pain, palpitations and leg swelling.  Genitourinary: Has kidney stones Musculoskeletal: Denies myalgias, back pain, joint swelling, arthralgias and gait problem.  Skin: No rash.  Neurological: Denies dizziness, seizures, syncope, weakness, light-headedness, numbness and headaches.  Hematological: Denies adenopathy. Easy bruising, personal or family bleeding history  Psychiatric/Behavioral: Has anxiety due to his job , nodepression     Physical Exam:    BP 118/70 (BP Location: Left Arm, Patient Position: Sitting, Cuff Size: Normal)   Pulse 84   Ht 5' 11.75" (1.822 m) Comment: height measured without shoes  Wt 299 lb 4 oz (135.7 kg)   BMI 40.87 kg/m  Filed Weights   09/11/21 0941  Weight: 299 lb 4 oz (135.7 kg)   Constitutional:  Well-developed, in no acute distress. Psychiatric: Normal mood and affect. Behavior is normal. HEENT: Pupils normal.  Conjunctivae are normal. No scleral icterus. Neck supple.  Cardiovascular: Normal rate, regular rhythm. No edema Pulmonary/chest: Effort normal and breath sounds normal. No wheezing, rales or rhonchi. Abdominal: Soft, nondistended. Nontender. Bowel sounds active throughout. There are no masses palpable. No  hepatomegaly. Rectal:  defered Neurological: Alert and oriented to person place and time. Skin: Skin is warm and dry. No rashes noted.  Data Reviewed: I have personally reviewed following labs and imaging studies  CBC:    Latest Ref Rng & Units 06/30/2021    5:13 PM 02/24/2021    1:31 PM 06/06/2020    2:43 PM  CBC  WBC 4.0 - 10.5 K/uL 12.4  11.4  6.9   Hemoglobin 13.0 - 17.0 g/dL 13.9  14.0  14.4   Hematocrit 39.0 - 52.0 % 42.6  42.6  44.3   Platelets 150 - 400 K/uL 191  196.0  173     CMP:    Latest Ref Rng & Units 07/12/2021    4:19 PM 06/30/2021    5:13 PM 09/05/2020   11:48 AM  CMP  Glucose 70 - 99 mg/dL 107  92    BUN 6 - 23 mg/dL 13  14    Creatinine 0.40 - 1.50 mg/dL 1.06  1.25    Sodium 135 - 145 mEq/L 139  140    Potassium 3.5 - 5.1 mEq/L 4.0  3.9    Chloride 96 - 112 mEq/L 102  108    CO2 19 - 32 mEq/L 28  26    Calcium 8.4 - 10.5 mg/dL 9.0  9.0    Total Protein 6.0 - 8.3 g/dL 7.5  7.7  7.0   Total Bilirubin 0.2 - 1.2 mg/dL 0.4  0.6    Alkaline Phos 39 - 117 U/L 75  66    AST 0 - 37 U/L 23  28    ALT 0 - 53 U/L 15  20     CT [facility] from [facility] was reviewed-sigmoid wall thickening with diverticulitis.  Recommend colonoscopy electively.   Carmell Austria, MD 09/11/2021, 10:01 AM  Cc: Colon Branch, MD

## 2021-09-11 NOTE — Patient Instructions (Addendum)
_______________________________________________________  If you are age 54 or older, your body mass index should be between 23-30. Your Body mass index is 40.87 kg/m. If this is out of the aforementioned range listed, please consider follow up with your Primary Care Provider.  If you are age 70 or younger, your body mass index should be between 19-25. Your Body mass index is 40.87 kg/m. If this is out of the aformentioned range listed, please consider follow up with your Primary Care Provider.   ________________________________________________________  The [facility] providers would like to encourage you to use [facility] to communicate with providers for non-urgent requests or questions.  Due to long hold times on the telephone, sending your provider a message by [facility] may be a faster and more efficient way to get a response.  Please allow 48 business hours for a response.  Please remember that this is for non-urgent requests.  _______________________________________________________  Your provider has requested that you go to the basement level for lab work before leaving today. Press "B" on the elevator. The lab is located at the first door on the left as you exit the elevator.  Continue metamucil and prune juice Add miralax 1 TSP daily  Call in 6 months to schedule follow up appointment  Thank you,  Dr. Jackquline Denmark

## 2021-10-16 ENCOUNTER — Encounter: Payer: Self-pay | Admitting: Licensed Clinical Social Worker

## 2021-10-16 ENCOUNTER — Inpatient Hospital Stay: Payer: 59

## 2021-10-16 ENCOUNTER — Other Ambulatory Visit: Payer: Self-pay | Admitting: Licensed Clinical Social Worker

## 2021-10-16 ENCOUNTER — Other Ambulatory Visit: Payer: Self-pay

## 2021-10-16 ENCOUNTER — Inpatient Hospital Stay: Payer: 59 | Attending: Genetic Counselor | Admitting: Licensed Clinical Social Worker

## 2021-10-16 DIAGNOSIS — Z800 Family history of malignant neoplasm of digestive organs: Secondary | ICD-10-CM

## 2021-10-16 DIAGNOSIS — Z808 Family history of malignant neoplasm of other organs or systems: Secondary | ICD-10-CM

## 2021-10-16 DIAGNOSIS — Z803 Family history of malignant neoplasm of breast: Secondary | ICD-10-CM

## 2021-10-16 NOTE — Progress Notes (Signed)
REFERRING PROVIDER: Ileana Roup, MD [address] [address],  [place] [address]  PRIMARY PROVIDER:  Colon Branch, MD  PRIMARY REASON FOR VISIT:  1. Family history of colon cancer   2. Family history of brain cancer      HISTORY OF PRESENT ILLNESS:   Mario Jimenez, a 56 y.o. male, was seen for a [place] cancer genetics consultation at the request of Dr. Dema Severin due to a family history of colon cancer.  Mario Jimenez presents to clinic today to discuss the possibility of a hereditary predisposition to cancer, genetic testing, and to further clarify his future cancer risks, as well as potential cancer risks for family members.    CANCER HISTORY:  Mario Jimenez is a 56 y.o. male with no personal history of cancer.  He had a colonoscopy in 2019 that showed 1 hyperplastic polyp. He had another colonoscopy in 2023 that did not reveal any polyps.   Past Medical History:  Diagnosis Date   Allergy    Asthma mild   no inhaler   Gallbladder polyp 2018   46m polyp   GERD (gastroesophageal reflux disease) occasional   watches diet and takes diet   Hemorrhoids    h/o Cscope    History of [place] infection [place] ago [place]  Meniscus tear 08/2010   to have surgery-- left knee   OSA on CPAP    Sleep apnea    Urolithiasis 2007-2008   [place] left kidney  3 surgeries ; has passed stones since then     Past Surgical History:  Procedure Laterality Date   CYSTOSCOPY/RETROGRADE/URETEROSCOPY/STONE EXTRACTION WITH BASKET  2007-- x2   KNEE ARTHROSCOPY  12/05/2010   Procedure: ARTHROSCOPY KNEE;  Surgeon: JJeneen RinksP Aplington;  Location[facility]  Service: Orthopedics;  Laterality: Left;  LEFT KNEE ARTHROSCOPY WITH PARTIAL MEDIAL  MENISECTOMY, Total lateral menisectomy, Shaving of Medial Femoral condyle   leg surgery  1977- age 56  Left , s/p traction, body cast    FAMILY HISTORY:  We obtained a detailed, 4-generation family history.   Significant diagnoses are listed below: Family History  Problem Relation Age of Onset   Stroke Mother        M d/t brain tumor    Cancer Mother        brain, poss. glioblastoma   Coronary artery disease Father        F, MI 554y/o   Colon cancer Brother 650      dx 12-2020   Colon cancer Paternal Uncle        dx 748s-80s  Colon cancer Maternal Grandmother        dx 8-s   Diabetes Neg Hx    Prostate cancer Neg Hx    Esophageal cancer Neg Hx    Mario Jimenez 1 brother and 1 sister. His brother passed recently from colon cancer at 662   Mr. SHardwickmother had brain cancer, possibly glioblastoma, she passed at 762 Her mother had colon cancer and died at 790  Mr. SRenteriafather passed at 9102 no cancer history. A paternal uncle had colon cancer in his 776s-80sand passed from it. Patient's paternal grandfather passed of lung cancer in his 83s  Mr. SBrensingeris unaware of previous family history of genetic testing for hereditary cancer risks. There is no reported Ashkenazi Jewish ancestry. There is no known consanguinity.    GENETIC COUNSELING ASSESSMENT: Mr.  Jimenez is a 56 y.o. male with a family history of colon cancer which is somewhat suggestive of a hereditary cancer syndrome and predisposition to cancer. We, therefore, discussed and recommended the following at today's visit.   DISCUSSION: We discussed that approximately 10% of colorectal cancer is hereditary. Most cases of hereditary colorectal cancer are associated with Lynch syndrome genes, although there are other genes associated with hereditary  cancer as well. Cancers and risks are gene specific. We discussed that testing is beneficial for several reasons including knowing about cancer risks, identifying potential screening and risk-reduction options that may be appropriate, and to understand if other family members could be at risk for cancer and allow them to undergo genetic testing.   We reviewed the characteristics, features  and inheritance patterns of hereditary cancer syndromes. We also discussed genetic testing, including the appropriate family members to test, the process of testing, insurance coverage and turn-around-time for results. We discussed the implications of a negative, positive and/or variant of uncertain significant result. We recommended Mario Jimenez pursue genetic testing for the [facility] Multi-Cancer+RNA gene panel.   Based on Mario Jimenez's family history of cancer, he meets medical criteria for genetic testing. Despite that he meets criteria, he may still have an out of pocket cost. We discussed that if his out of pocket cost for testing is over $100, the laboratory will call and confirm whether he wants to proceed with testing.  If the out of pocket cost of testing is less than $100 he will be billed by the genetic testing laboratory.   PLAN: After considering the risks, benefits, and limitations, Mario Jimenez provided informed consent to pursue genetic testing and the blood sample was sent to [facility] for analysis of the Multi-Cancer+RNA panel. Results should be available within approximately 2-3 weeks' time, at which point they will be disclosed by telephone to Mario Jimenez, as will any additional recommendations warranted by these results. Mario Jimenez will receive a summary of his genetic counseling visit and a copy of his results once available. This information will also be available in Epic.   [name] questions were answered to his satisfaction today. Our contact information was provided should additional questions or concerns arise. Thank you for the referral and allowing [place] to share in the care of your patient.   Faith Rogue, MS, [facility] Genetic Counselor [email or web address] Phone: [phone]  The patient was seen for a total of 25 minutes in face-to-face genetic counseling.  Dr. Grayland Ormond was available for discussion regarding this case.    _______________________________________________________________________ For Office Staff:  Number of people involved in session: 1 Was an Intern/ student involved with case: no

## 2021-10-23 ENCOUNTER — Encounter: Payer: Self-pay | Admitting: Internal Medicine

## 2021-10-23 ENCOUNTER — Ambulatory Visit (INDEPENDENT_AMBULATORY_CARE_PROVIDER_SITE_OTHER): Payer: 59 | Admitting: Internal Medicine

## 2021-10-23 VITALS — BP 132/72 | HR 78 | Temp 98.2°F | Resp 18 | Ht 71.75 in | Wt 295.4 lb

## 2021-10-23 DIAGNOSIS — I10 Essential (primary) hypertension: Secondary | ICD-10-CM | POA: Diagnosis not present

## 2021-10-23 DIAGNOSIS — Z0000 Encounter for general adult medical examination without abnormal findings: Secondary | ICD-10-CM

## 2021-10-23 DIAGNOSIS — Z125 Encounter for screening for malignant neoplasm of prostate: Secondary | ICD-10-CM

## 2021-10-23 DIAGNOSIS — Z122 Encounter for screening for malignant neoplasm of respiratory organs: Secondary | ICD-10-CM | POA: Diagnosis not present

## 2021-10-23 DIAGNOSIS — Z87891 Personal history of nicotine dependence: Secondary | ICD-10-CM

## 2021-10-23 DIAGNOSIS — Z23 Encounter for immunization: Secondary | ICD-10-CM

## 2021-10-23 LAB — LIPID PANEL
Cholesterol: 128 mg/dL (ref 0–200)
HDL: 33.6 mg/dL — ABNORMAL LOW (ref >39.00)
LDL Cholesterol: 70 mg/dL (ref 0–99)
NonHDL: 94.48
Total CHOL/HDL Ratio: 4
Triglycerides: 121 mg/dL (ref 0.0–149.0)
VLDL: 24.2 mg/dL (ref 0.0–40.0)

## 2021-10-23 LAB — PSA: PSA: 0.9 ng/mL (ref 0.10–4.00)

## 2021-10-23 NOTE — Patient Instructions (Addendum)
Referring you for an CAT scan of your chest, if they do not call you in the next few days let us know  Start a gradual exercise program, just fast walking is good enough.  Your goal is to do a total of 3 hours a week.  GO TO THE LAB : Get the blood work     [place], [place] Come back for   checkup in 4 months       Advanced care planning:  Do you have a "Living will", "[place] of attorney" ?   If you already have a living will or healthcare power of attorney, is recommended you bring the copy to be scanned in your chart. The document will be available to all the doctors you see in the system.  If you don't have one, please consider create one.  Advance care planning is a process that supports adults in  understanding and sharing their preferences regarding future medical care.   The patient's preferences are recorded in documents called Advance Directives.    Advanced directives are completed (and can be modified at any time) while the patient is in full mental capacity.   The documentation should be available at all times to the patient, the family and the healthcare providers.   This legal documents direct treatment decision making and/or appoint a surrogate to make the decision if the patient is not capable to do so.    Advance directives can be documented in many types of formats,  documents have names such as:  Lliving will  Durable power of attorney for healthcare (healthcare proxy or healthcare power of attorney)  Combined directives  Physician orders for life-sustaining treatment    More information at:  [email or web address]

## 2021-10-23 NOTE — Assessment & Plan Note (Signed)
For CPX Asthma: Not an issue at this time GERD: On medications prn Recurrent diverticulitis, family history of colon cancer:  Saw GI 09/11/2021, they recommended conservative treatment for his history of sigmoid stricture d/t recurrent diverticulitis.  Next OV 6 months Genetic counseling: Due to family history of colon and breast cancer, saw a geneticist, they recommended further blood testing.  Results pending DOE: Going on for a while, only when he walks up  a stairs, no associated sxs .  Recommend a gradual increase in physical activity and reassess on RTC RTC 4 months

## 2021-10-23 NOTE — Progress Notes (Signed)
Subjective:    Patient ID: Mario Jimenez, male    DOB: February 12, 1965, 56 y.o.   MRN: [id number]  DOS:  10/23/2021 Type of visit - description: cpx  Since the last office visit, saw GI.  Note reviewed. In general feels well. Still has from time to time red blood per rectum, not a new issue. Also for a while has noted some shortness of breath when he goes upstairs.  No associated chest pain, cough, wheezing, lower extremity edema. He feels fine doing  any other activity at home or at work.    Review of Systems  Other than above, a 14 point review of systems is negative     Past Medical History:  Diagnosis Date   Allergy    Asthma mild   no inhaler   Gallbladder polyp 2018   23m polyp   GERD (gastroesophageal reflux disease) occasional   watches diet and takes diet   Hemorrhoids    h/o Cscope    History of [place] infection [place]  Meniscus tear 08/2010   to have surgery-- left knee   OSA on CPAP    Sleep apnea    Urolithiasis 2007-2008   [place]  3 surgeries ; has passed stones since then     Past Surgical History:  Procedure Laterality Date   CYSTOSCOPY/RETROGRADE/URETEROSCOPY/STONE EXTRACTION WITH BASKET  2007-- x2   KNEE ARTHROSCOPY  12/05/2010   Procedure: ARTHROSCOPY KNEE;  Surgeon: JJeneen RinksP [place];  [facility]  Service: Orthopedics;  Laterality: Left;  LEFT KNEE ARTHROSCOPY WITH PARTIAL MEDIAL  MENISECTOMY, Total lateral menisectomy, Shaving of Medial Femoral condyle   leg surgery  1977- age 56  Left , s/p traction, body cast   Social History   Socioeconomic History   Marital status: Single    Spouse name: Not on file   Number of children: 0   Years of education: Not on file   Highest education level: Not on file  Occupational History   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 Tobacco Use   Smoking status: Former    Types: Cigarettes    Quit date: 08/2018    Years since quitting: 3.1    Smokeless tobacco: Never   Tobacco comments:    Smoke on-off    1 ppd [age] pack-year    1 ppd [age] = 6 pack-year     Total: 22 pack-year    Quit 2020  Vaping Use   Vaping Use: Never used  Substance and Sexual Activity   Alcohol use: Not Currently    Comment: None Since 2017 MB RN   Drug use: No   Sexual activity: Not on file  Other Topics Concern   Not on file  Social History Narrative   [place] [facility]   Lives by himself    Right handed    Caffeine- 32 oz per day    Social Determinants of Health   Financial Resource Strain: Not on file  Food Insecurity: Not on file  Transportation Needs: Not on file  Physical Activity: Not on file  Stress: Not on file  Social Connections: Not on file  Intimate Partner Violence: Not on file     Current Outpatient Medications  Medication Instructions   aspirin EC 81 mg, Oral, Daily, Swallow whole.   bisacodyl (DULCOLAX) 5 mg, Oral, As needed  calcium carbonate (OS-CAL - DOSED IN MG OF ELEMENTAL CALCIUM) 1250 (500 Ca) MG tablet 1 tablet, Oral   co-enzyme Q-10 30 mg, Oral, 3 times daily   dicyclomine (BENTYL) 10 mg, Oral, 2 times daily   docusate sodium (STOOL SOFTENER) 100 mg, As needed   FIBER PO 1 Dose, Oral, Daily   gabapentin (NEURONTIN) 300 mg, Oral, 2 times daily   hydrocortisone (ANUSOL-HC) 25 mg, Rectal, 2 times daily PRN   Multiple Vitamin (MULTIVITAMIN) capsule 1 capsule, Oral, Daily   naproxen sodium (ALEVE) 220 mg, Oral, 2 times daily PRN   omega-3 acid ethyl esters (LOVAZA) 1 g capsule Oral, 2 times daily   pantoprazole (PROTONIX) 20 mg, Oral, Daily   polyethylene glycol (MIRALAX / GLYCOLAX) 17 g, Oral, Daily   vitamin C 1,000 mg, Oral, Daily   zinc gluconate 50 mg, Oral, Daily       Objective:   Physical Exam BP 132/72   Pulse 78   Temp 98.2 F (36.8 C) (Oral)   Resp 18   Ht 5' 11.75" (1.822 m)   Wt 295 lb 6 oz (134 kg)   SpO2 97%   BMI 40.34  kg/m  General: Well developed, NAD, BMI noted Neck: No  thyromegaly  HEENT:  Normocephalic . Face symmetric, atraumatic Lungs:  CTA B Normal respiratory effort, no intercostal retractions, no accessory muscle use. Heart: RRR,  no murmur.  Abdomen:  Not distended, soft, non-tender. No rebound or rigidity.   Lower extremities: no pretibial edema bilaterally  Skin: Exposed areas without rash. Not pale. Not jaundice Neurologic:  alert & oriented X3.  Speech normal, gait appropriate for age and unassisted Strength symmetric and appropriate for age.  Psych: Cognition and judgment appear intact.  Cooperative with normal attention span and concentration.  Behavior appropriate. No anxious or depressed appearing.     Assessment    ASSESSMENT Asthma GERD Chronic sinusitis, Allergies Urolithiasis Recurrent  diverticulitis GB polyp, saw surgery/2018, rec a [place] in 6 months. Surgery if polyp >  10 mm.  [place] 06/2021: Polyps stable, no further routine [place] needed Smoker: quit 08/2018 Chronic sinusitis: Seen by ENT 2018, Dr Laurance Flatten, rx medical treatment, no polyps OSA dx 07/2019 FH colon cancer, breast cancer Neuropathy,Burning feet syndrome, perioral numbness:  NCS 10-2020 , saw neuro, Rx gaba  PLAN: For CPX Asthma: Not an issue at this time GERD: On medications prn Recurrent diverticulitis, family history of colon cancer:  Saw GI 09/11/2021, they recommended conservative treatment for his history of sigmoid stricture d/t recurrent diverticulitis.  Next OV 6 months Genetic counseling: Due to family history of colon and breast cancer, saw a geneticist, they recommended further blood testing.  Results pending DOE: Going on for a while, only when he walks up  a stairs, no associated sxs .  Recommend a gradual increase in physical activity and reassess on RTC RTC 4 months

## 2021-10-23 NOTE — Assessment & Plan Note (Signed)
-  Td: Booster 10/23/2021 - COVID-vaccine consistently declined, benefits discussed. - Shingrix discussed, #1 today, next on RTC 4 m - Flu shot recommended: Declined -FPK:GYBNLW 2006 @ [facility] (d/t diverticulitis?); Cscope 07-2017,  Rectal polyp, internal and external hemorrhoids grade 4.  C-scope 05/2021, next per GI -prostate ca screening: DRE normal last year, check a PSA. -Labs: FLP, PSA -Diet exercise: Discussed - Lung cancer screening: smoked 22-pack-year, quit 2020, previous referral for CT screening failed, will try again. - ACP discussed.

## 2021-11-13 ENCOUNTER — Telehealth: Payer: Self-pay | Admitting: Licensed Clinical Social Worker

## 2021-11-13 NOTE — Telephone Encounter (Signed)
I contacted Mr. Lynne to discuss his genetic testing results. Single pathogenic variant in [place] called [id number] (p.Lys477dup) identified. 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 This particular mutation is not associated with HLRCC.  No other pathogenic variants were identified in the 84 genes analyzed. Detailed clinic note to follow.   The test report has been scanned into EPIC and is located under the Molecular Pathology section of the Results Review tab.  A portion of the result report is included below for reference.      Faith Rogue, MS, [facility] Genetic Counselor [email or web address] Phone: [phone]

## 2021-11-14 ENCOUNTER — Ambulatory Visit: Payer: Self-pay | Admitting: Licensed Clinical Social Worker

## 2021-11-14 ENCOUNTER — Encounter: Payer: Self-pay | Admitting: Licensed Clinical Social Worker

## 2021-11-14 DIAGNOSIS — Z1379 Encounter for other screening for genetic and chromosomal anomalies: Secondary | ICD-10-CM

## 2021-11-14 NOTE — Progress Notes (Signed)
HPI:   Mr. Thornsberry was previously seen in the Progress clinic due to a family history of colon cancer and concerns regarding a hereditary predisposition to cancer. Please refer to our prior cancer genetics clinic note for more information regarding our discussion, assessment and recommendations, at the time. Mr. Rueb recent genetic test results were disclosed to him, as were recommendations warranted by these results. These results and recommendations are discussed in more detail below.  CANCER HISTORY:  Oncology History   No history exists.    FAMILY HISTORY:  We obtained a detailed, 4-generation family history.  Significant diagnoses are listed below: Family History  Problem Relation Age of Onset   Stroke Mother        M d/t brain tumor    Cancer Mother        brain, poss. glioblastoma   Coronary artery disease Father        F, MI 55 y/o   Colon cancer Brother 29       dx 12-2020   Colon cancer Paternal Uncle        dx 46s-80s   Colon cancer Maternal Grandmother        dx 8-s   Diabetes Neg Hx    Prostate cancer Neg Hx    Esophageal cancer Neg Hx     Mr. Sudano has 1 brother and 1 sister. His brother passed recently from colon cancer at 58.    Mr. Falkner mother had brain cancer, possibly glioblastoma, she passed at 88. Her mother had colon cancer and died at 59.   Mr. Goldwater father passed at 78, no cancer history. A paternal uncle had colon cancer in his 10s-80s and passed from it. Patient's paternal grandfather passed of lung cancer in his 35s.   Mr. Leffler is unaware of previous family history of genetic testing for hereditary cancer risks. There is no reported Ashkenazi Jewish ancestry. There is no known consanguinity.     GENETIC TEST RESULTS:  The [facility] Multi-Cancer+RNA Panel found no pathogenic mutations.   The Multi-Cancer Panel + RNA offered by [facility] includes sequencing and/or deletion duplication testing of the following 84 genes:  AIP, ALK, APC, ATM, AXIN2,BAP1,  BARD1, BLM, BMPR1A, BRCA1, BRCA2, BRIP1, CASR, CDC73, CDH1, CDK4, CDKN1B, CDKN1C, CDKN2A (p14ARF), CDKN2A (p16INK4a), CEBPA, CHEK2, CTNNA1, DICER1, DIS3L2, EGFR (c.2369C>T, p.Thr790Met variant only), EPCAM (Deletion/duplication testing only), FH, FLCN, GATA2, GPC3, GREM1 (Promoter region deletion/duplication testing only), HOXB13 (c.251G>A, p.Gly84Glu), HRAS, KIT, MAX, MEN1, MET, MITF (c.952G>A, p.Glu318Lys variant only), MLH1, MSH2, MSH3, MSH6, MUTYH, NBN, NF1, NF2, NTHL1, PALB2, PDGFRA, PHOX2B, PMS2, POLD1, POLE, POT1, PRKAR1A, PTCH1, PTEN, RAD50, RAD51C, RAD51D, RB1, RECQL4, RET, RUNX1, SDHAF2, SDHA (sequence changes only), SDHB, SDHC, SDHD, SMAD4, SMARCA4, SMARCB1, SMARCE1, STK11, SUFU, TERC, TERT, TMEM127, TP53, TSC1, TSC2, VHL, WRN and WT1.   The test report has been scanned into EPIC and is located under the Molecular Pathology section of the Results Review tab.  A portion of the result report is included below for reference. Genetic testing reported out on 11/07/2021.     Even though a pathogenic variant that explains the family history was not identified, possible explanations for the cancer in the family may include: There may be no hereditary risk for cancer in the family. The cancers in  his family may be sporadic/familial or due to other genetic and environmental factors. There may be a gene mutation in one of these genes that current testing methods cannot detect but that chance is small.  There could be another gene that has not yet been discovered, or that we have not yet tested, that is responsible for the cancer diagnoses in the family.  It is also possible there is a hereditary cause for the cancer in the family that Mr. Blyden did not inherit.  Therefore, it is important to remain in touch with cancer genetics in the future so that we can continue to offer Mr. Glauser the most up to date genetic testing.   ADDITIONAL GENETIC TESTING:  We discussed  with Mr. Valente that his genetic testing was fairly extensive.  If there are additional relevant genes identified to increase cancer risk that can be analyzed in the future, we would be happy to discuss and coordinate this testing at that time.    FHD (Fumerate Hydratase Deficiency) Carrier FHD is an inborn error of metabolism that causes infantile encephalopathy, along with symptoms of failure to thrive, hypotonia, lethargy, and seizures. Affected individuals often have microcephaly, distinctive facial features, structural brain abnormalities and developmental delay.  The [id number] (p.Lys477dup) variant in the FH gene is associated with autosomal recessive fumarate hydratase deficiency (FHD). The association of this variant with autosomal dominant FH tumor predisposition syndrome (previously known as hereditary leiomyomatosis and renal cell cancer (HLRCC)) (MedGen UID: [id number]) is unclear. This individual is a carrier for autosomal recessive FHD. This specific variant has not been reported to confer risk for autosomal dominant FH tumor predisposition syndrome. This result is insufficient to cause autosomal recessive FHD; however, carrier status does impact reproductive risk.  Biological relatives have a chance of being a carrier for autosomal recessive FH-related conditions. The chance of having a child with autosomal recessive FH-related conditions depends on the carrier state of this individual's partner.  CANCER SCREENING RECOMMENDATIONS:  Mr. Dasaro test result is considered essentially normal.  This means that we have not identified a hereditary cause for his family history of cancer at this time.   An individual's cancer risk and medical management are not determined by genetic test results alone. Overall cancer risk assessment incorporates additional factors, including personal medical history, family history, and any available genetic information that may result in a personalized plan for  cancer prevention and surveillance. Therefore, it is recommended he continue to follow the cancer management and screening guidelines provided by his primary healthcare provider.  Based on the reported personal and family history, specific cancer screenings for Mr. HOYLE BARKDULL and his family include:  Colon Cancer Screening: Due to brother's history of colon cancer, he is recommended to repeat colonoscopies at least every 5 years. More frequent colonoscopies may be recommended if polyps are identified.  RECOMMENDATIONS FOR FAMILY MEMBERS:   Individuals in this family might be at some increased risk of developing cancer, over the general population risk, due to the family history of cancer.  Individuals in the family should notify their providers of the family history of cancer. We recommend women in this family have a yearly mammogram beginning at age 56, or 11 years younger than the earliest onset of cancer, an annual clinical breast exam, and perform monthly breast self-exams.  Family members should have colonoscopies by at age 50, or earlier, as recommended by their providers. Other members of the family may still carry a pathogenic variant in one of these genes that Mr. Howton did not inherit. Based on the family history, we recommend his siblings/maternal relatives genetic counseling and testing. Mr. Walkins will let us know if we can be of any assistance in  coordinating genetic counseling and/or testing for this family member.   Family members can consider testing for the FH gene mutation identified in Mr. Atwater.  FOLLOW-UP:  Lastly, we discussed with Mr. Lever that cancer genetics is a rapidly advancing field and it is possible that new genetic tests will be appropriate for him and/or his family members in the future. We encouraged him to remain in contact with cancer genetics on an annual basis so we can update his personal and family histories and let him know of advances in cancer genetics  that may benefit this family.   Our contact number was provided. Mr. Glantz questions were answered to his satisfaction, and he knows he is welcome to call us at anytime with additional questions or concerns.    Faith Rogue, MS, [facility] Genetic Counselor [email or web address] Phone: [phone]

## 2022-02-26 ENCOUNTER — Encounter: Payer: Self-pay | Admitting: Internal Medicine

## 2022-02-26 ENCOUNTER — Ambulatory Visit (INDEPENDENT_AMBULATORY_CARE_PROVIDER_SITE_OTHER): Payer: Self-pay | Admitting: Internal Medicine

## 2022-02-26 VITALS — BP 136/74 | HR 91 | Temp 98.1°F | Resp 16 | Ht 71.75 in | Wt 291.0 lb

## 2022-02-26 DIAGNOSIS — R0609 Other forms of dyspnea: Secondary | ICD-10-CM

## 2022-02-26 DIAGNOSIS — R202 Paresthesia of skin: Secondary | ICD-10-CM

## 2022-02-26 MED ORDER — GABAPENTIN 300 MG PO CAPS: 300.0000 mg | ORAL_CAPSULE | Freq: Three times a day (TID) | ORAL | 6 refills | Status: DC

## 2022-02-26 NOTE — Assessment & Plan Note (Signed)
Asthma: Not an issue at this time. Neuropathy, burning feet syndrome: Ongoing burning at the feet nocturnally, also occasionally burning at the hands.  Patient would like to increase gabapentin to 300 mg 3 times daily.  That is reasonable, prescription sent. Genetic counseling: FH colon & breast cancer. Saw a geneticist, tests were done, results essentially normal. Shingrix: Due for a booster however has no medical insurance.  Will discuss at the next opportunity. DOE: At baseline, encouraged gradual increase in exercise RTC October 2024 for CPX

## 2022-02-26 NOTE — Progress Notes (Signed)
Subjective:    Patient ID: Mario Jimenez, male    DOB: 08/03/1965, 57 y.o.   MRN: [id number]  DOS:  02/26/2022 Type of visit - description: f/u  Has been unemployed since January 5.  In at least a couple of occasions developed pain described as itching and burning at both hands, from the wrist down.  Denies elbow or neck pain. No change in the color of the hands.  DOE: See LOV, about the same, denies chest pain or palpitations.  Review of Systems See above   Past Medical History:  Diagnosis Date   Allergy    Asthma mild   no inhaler   Gallbladder polyp 2018   10m polyp   GERD (gastroesophageal reflux disease) occasional   watches diet and takes diet   Hemorrhoids    h/o Cscope    History of MRSA infection few yrs ago while in hospital in [place]   Meniscus tear 08/2010   to have surgery-- left knee   OSA on CPAP    Sleep apnea    Urolithiasis 2007-2008   [place]  3 surgeries ; has passed stones since then     Past Surgical History:  Procedure Laterality Date   CYSTOSCOPY/RETROGRADE/URETEROSCOPY/STONE EXTRACTION WITH BASKET  2007-- x2   KNEE ARTHROSCOPY  12/05/2010   Procedure: ARTHROSCOPY KNEE;  Surgeon: JJeneen RinksP [place];  [facility]  Service: Orthopedics;  Laterality: Left;  LEFT KNEE ARTHROSCOPY WITH PARTIAL MEDIAL  MENISECTOMY, Total lateral menisectomy, Shaving of Medial Femoral condyle   leg surgery  1977- age 89  Left , s/p traction, body cast    Current Outpatient Medications  Medication Instructions   aspirin EC 81 mg, Oral, Daily, Swallow whole.   bisacodyl (DULCOLAX) 5 mg, Oral, As needed   calcium carbonate (OS-CAL - DOSED IN MG OF ELEMENTAL CALCIUM) 1250 (500 Ca) MG tablet 1 tablet, Oral   co-enzyme Q-10 30 mg, Oral, 3 times daily   dicyclomine (BENTYL) 10 mg, Oral, 2 times daily   docusate sodium (STOOL SOFTENER) 100 mg, As needed   FIBER PO 1 Dose, Oral, Daily   gabapentin (NEURONTIN) 300 mg, Oral, 2 times  daily   hydrocortisone (ANUSOL-HC) 25 mg, Rectal, 2 times daily PRN   Multiple Vitamin (MULTIVITAMIN) capsule 1 capsule, Oral, Daily   naproxen sodium (ALEVE) 220 mg, Oral, 2 times daily PRN   omega-3 acid ethyl esters (LOVAZA) 1 g capsule Oral, 2 times daily   pantoprazole (PROTONIX) 20 mg, Oral, Daily   polyethylene glycol (MIRALAX / GLYCOLAX) 17 g, Oral, Daily   vitamin C 1,000 mg, Oral, Daily   zinc gluconate 50 mg, Oral, Daily       Objective:   Physical Exam BP 136/74   Pulse 91   Temp 98.1 F (36.7 C) (Oral)   Resp 16   Ht 5' 11.75" (1.822 m)   Wt 291 lb (132 kg)   SpO2 97%   BMI 39.74 kg/m  General:   Well developed, NAD, BMI noted. HEENT:  Normocephalic . Face symmetric, atraumatic Lungs:  CTA B Normal respiratory effort, no intercostal retractions, no accessory muscle use. Heart: RRR,  no murmur.  Lower extremities: no pretibial edema bilaterally Upper extremities: Hands and wrists normal to inspection and palpation Skin: Not pale. Not jaundice Neurologic:  alert & oriented X3.  Speech normal, gait appropriate for age and unassisted Psych--  Cognition and judgment appear intact.  Cooperative with normal attention span and concentration.  Behavior appropriate. No anxious or depressed appearing.      Assessment     ASSESSMENT Asthma GERD Chronic sinusitis, Allergies Urolithiasis GI: Recurrent  diverticulitis-- saw GI 09/11/2021, they rex conservative treatment for his sigmoid stricture d/t recurrent diverticulitis.  GB polyp, saw surgery/2018, rec a [place] in 6 months. Surgery if polyp >  10 mm.  [place] 06/2021: Polyps stable, no further routine [place] needed Smoker: quit 08/2018 Chronic sinusitis: Seen by ENT 2018, Dr Laurance Flatten, rx medical treatment, no polyps OSA dx 07/2019 FH colon cancer, breast cancer Neuropathy,Burning feet syndrome, perioral numbness:  NCS 10-2020 , saw neuro, Rx gaba  PLAN: Asthma: Not an issue at this time. Neuropathy, burning feet  syndrome: Ongoing burning at the feet nocturnally, also occasionally burning at the hands.  Patient would like to increase gabapentin to 300 mg 3 times daily.  That is reasonable, prescription sent. Genetic counseling: FH colon & breast cancer. Saw a geneticist, tests were done, results essentially normal. Shingrix: Due for a booster however has no medical insurance.  Will discuss at the next opportunity. DOE: At baseline, encouraged gradual increase in exercise RTC October 2024 for CPX

## 2022-02-26 NOTE — Patient Instructions (Signed)
Increase gabapentin to 300 mg: 1 tablet 3 times a day      GO TO THE FRONT DESK, PLEASE SCHEDULE YOUR APPOINTMENTS Come back for   a physical exam by October 2024

## 2022-10-30 ENCOUNTER — Encounter: Payer: Self-pay | Admitting: Internal Medicine

## 2022-11-05 ENCOUNTER — Other Ambulatory Visit: Payer: Self-pay | Admitting: Internal Medicine

## 2022-11-15 IMAGING — US US ABDOMEN LIMITED
1 series · 14 of 25 positions shown · non-contrast
Comparison: CT 07/14/2019, abdominal ultrasound 10/18/2016

CLINICAL DATA: Right upper quadrant pain.

EXAM:
ULTRASOUND ABDOMEN LIMITED RIGHT UPPER QUADRANT

[Series 1: us abdomen limited · 14 of 41 slices shown]
[im 1/41]
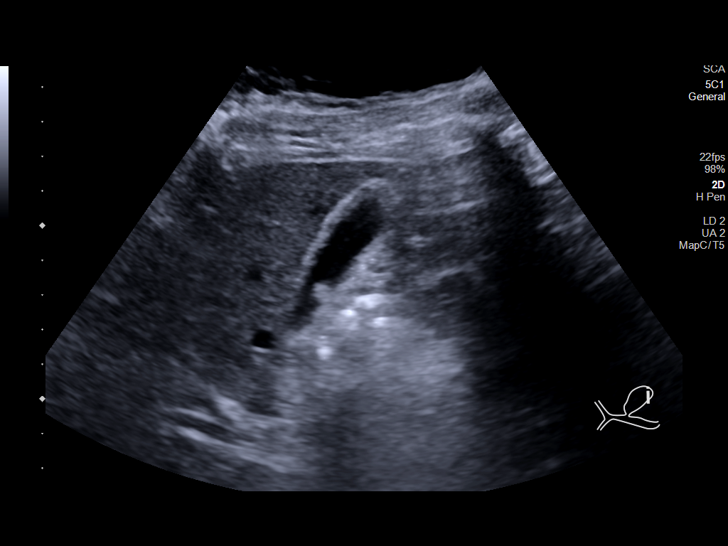
[im 4/41]
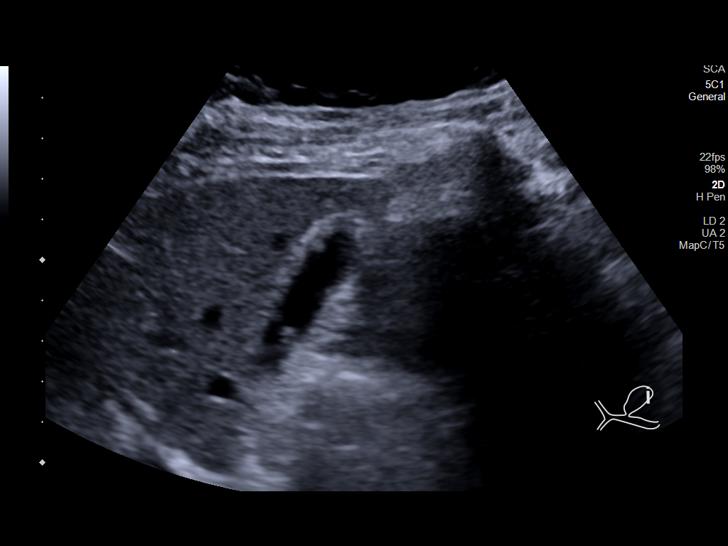
[im 7/41]
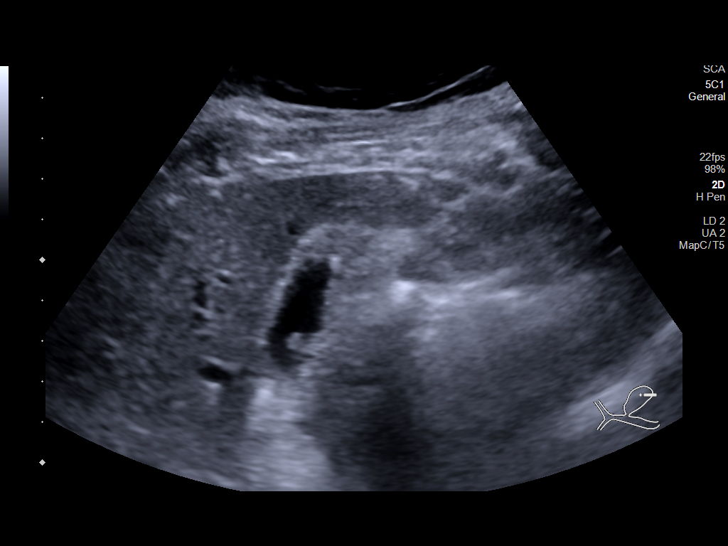
[im 11/41]
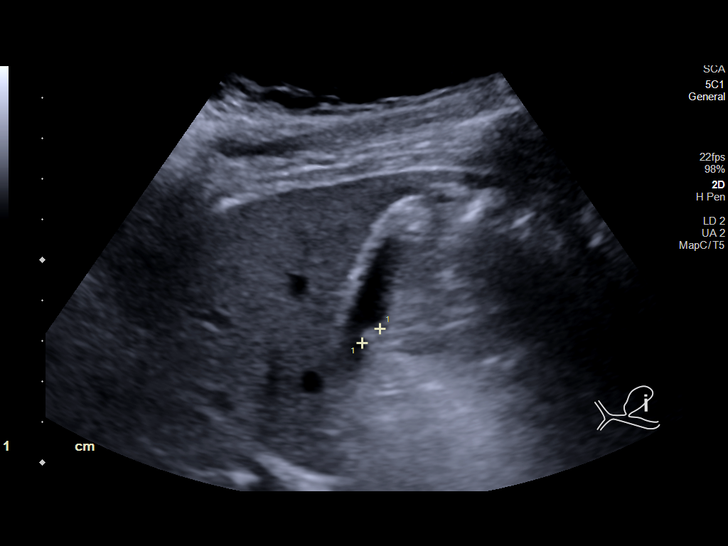
[im 14/41]
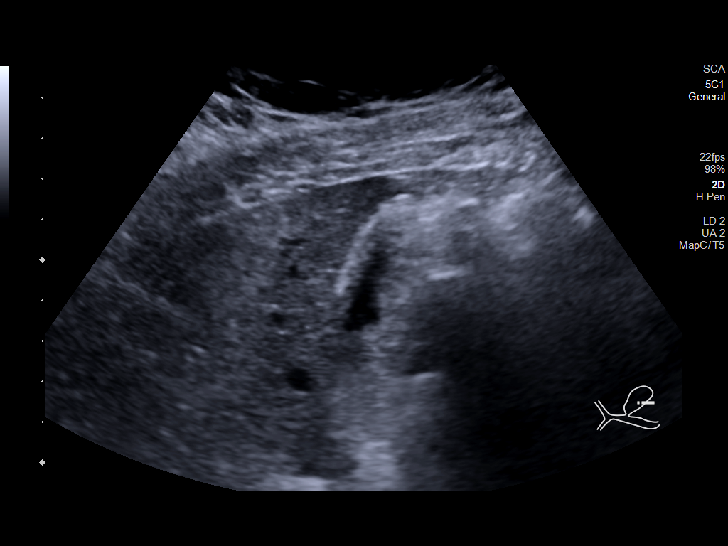
[im 16/41]
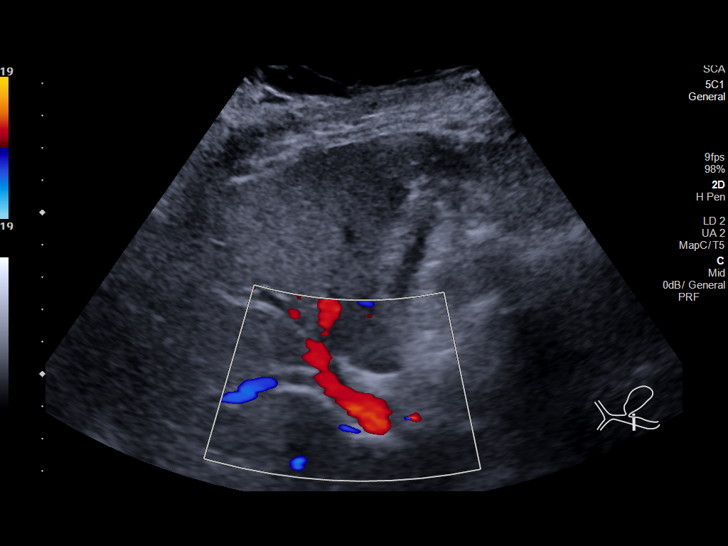
[im 19/41]
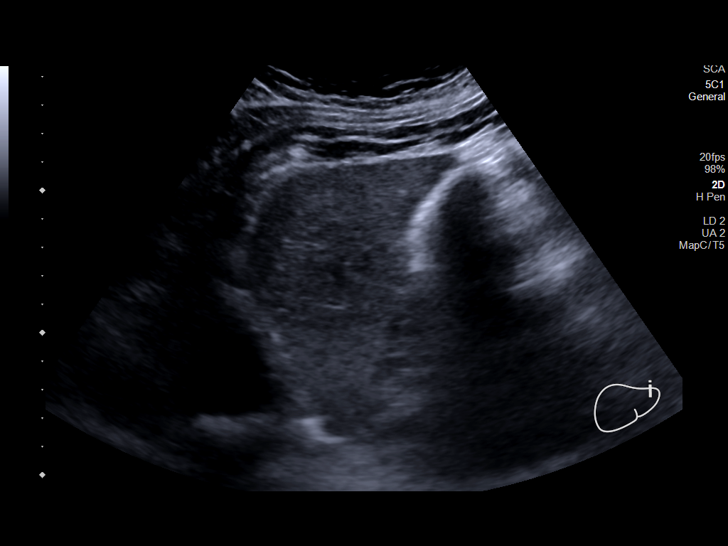
[im 22/41]
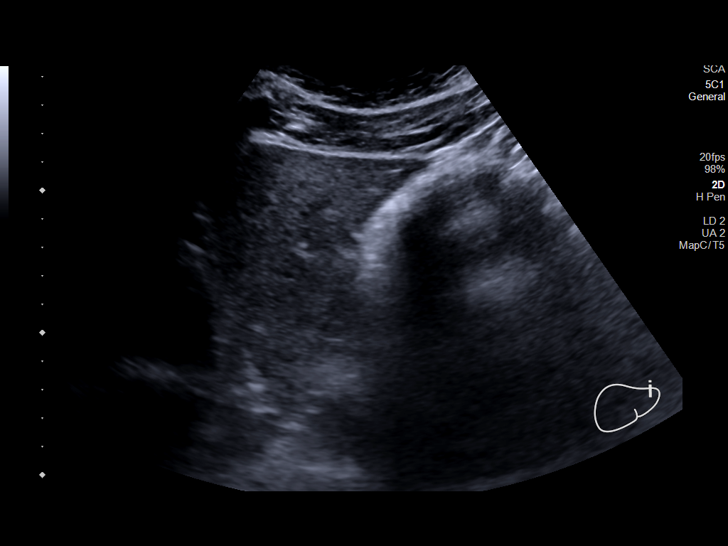
[im 26/41]
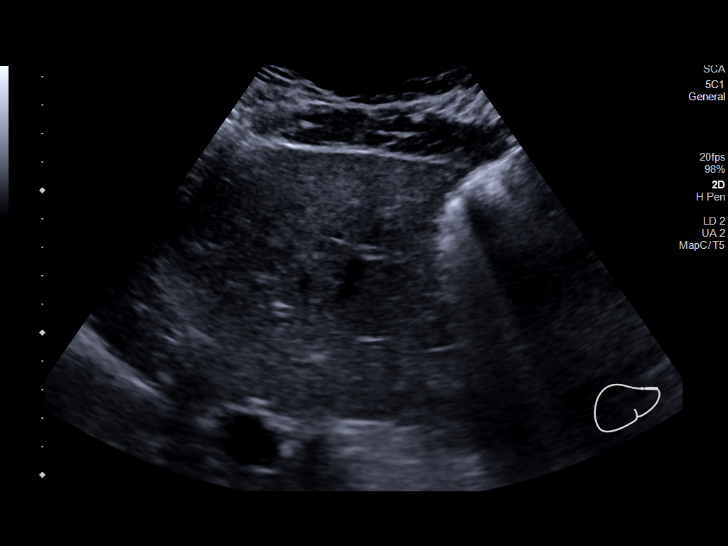
[im 27/41]
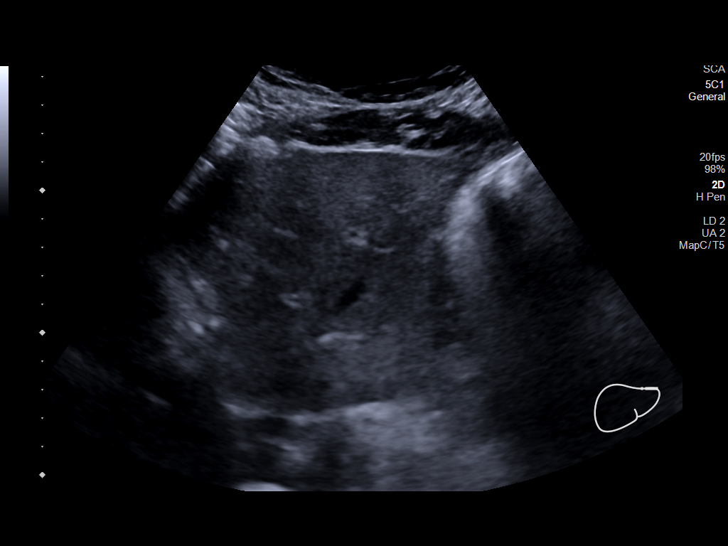
[im 31/41]
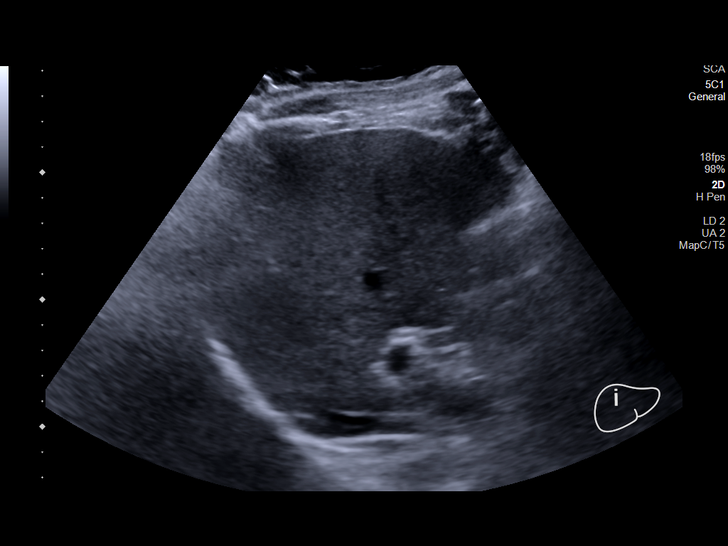
[im 34/41]
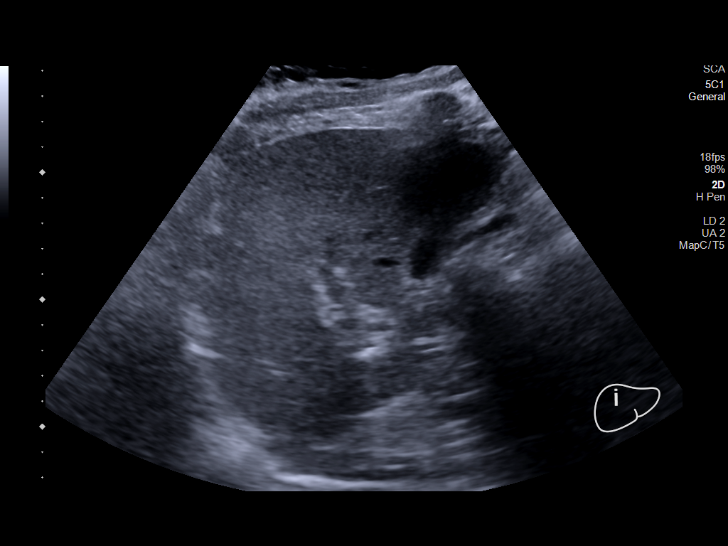
[im 37/41]
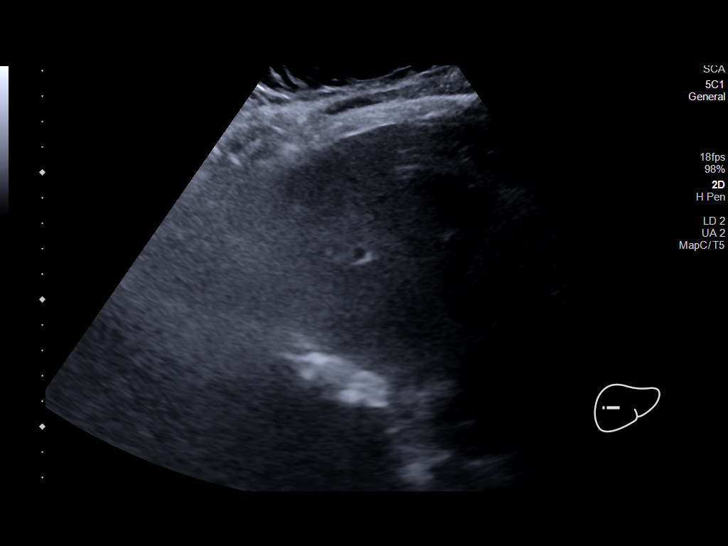
[im 41/41]
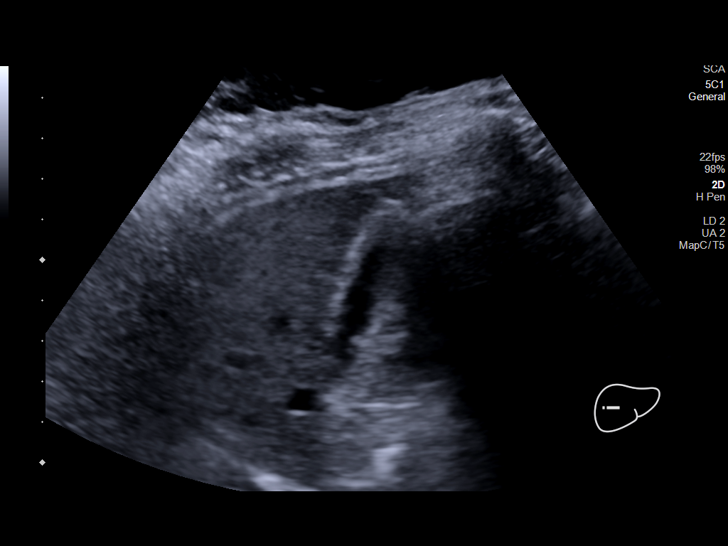

[14 of 25 positions shown; findings below may reference images not displayed]

FINDINGS: Gallbladder:

Only partially distended. Gallbladder wall thickening of 5 mm is
likely accentuated by nondistention. Gallbladder polyps with the
largest being 6 mm, unchanged from 4063 exam. No gallstones. No
pericholecystic fluid. No sonographic Murphy sign noted by
sonographer.

Common bile duct:

Diameter: 2-3 mm.

Liver:

No focal lesion identified. Heterogeneous in parenchymal
echogenicity. Portal vein is patent on color Doppler imaging with
normal direction of blood flow towards the liver.

Other: No right upper quadrant ascites.
IMPRESSION: 1. Contracted gallbladder which is nonspecific. This can be seen in
the setting of chronic cholecystitis. Gallbladder wall thickness of
5 mm is likely accentuated by nondistention.
2. Stable gallbladder polyp dating back to 4063 exam. Given size and
stability, no further follow-up is needed. No gallstones.

## 2022-11-26 ENCOUNTER — Encounter: Payer: Self-pay | Admitting: Gastroenterology

## 2022-12-18 DIAGNOSIS — H1032 Unspecified acute conjunctivitis, left eye: Secondary | ICD-10-CM | POA: Diagnosis not present

## 2022-12-28 ENCOUNTER — Ambulatory Visit (INDEPENDENT_AMBULATORY_CARE_PROVIDER_SITE_OTHER): Payer: BC Managed Care – PPO | Admitting: Internal Medicine

## 2022-12-28 VITALS — BP 148/84 | HR 86 | Temp 97.6°F | Resp 18 | Ht 71.75 in | Wt 303.2 lb

## 2022-12-28 DIAGNOSIS — I10 Essential (primary) hypertension: Secondary | ICD-10-CM | POA: Diagnosis not present

## 2022-12-28 DIAGNOSIS — Z23 Encounter for immunization: Secondary | ICD-10-CM

## 2022-12-28 DIAGNOSIS — G629 Polyneuropathy, unspecified: Secondary | ICD-10-CM | POA: Diagnosis not present

## 2022-12-28 DIAGNOSIS — M79672 Pain in left foot: Secondary | ICD-10-CM

## 2022-12-28 DIAGNOSIS — Z0000 Encounter for general adult medical examination without abnormal findings: Secondary | ICD-10-CM

## 2022-12-28 DIAGNOSIS — M79671 Pain in right foot: Secondary | ICD-10-CM

## 2022-12-28 DIAGNOSIS — Z0001 Encounter for general adult medical examination with abnormal findings: Secondary | ICD-10-CM

## 2022-12-28 MED ORDER — GABAPENTIN 600 MG PO TABS: 600.0000 mg | ORAL_TABLET | Freq: Three times a day (TID) | ORAL | 1 refills | Status: DC

## 2022-12-28 MED ORDER — AMLODIPINE BESYLATE 5 MG PO TABS: 5.0000 mg | ORAL_TABLET | Freq: Every day | ORAL | 0 refills | Status: AC

## 2022-12-28 MED ORDER — ZEPBOUND 2.5 MG/0.5ML ~~LOC~~ SOAJ: 2.5000 mg | SUBCUTANEOUS | 0 refills | Status: DC

## 2022-12-28 NOTE — Progress Notes (Unsigned)
Subjective:    Patient ID: Mario Jimenez, male    DOB: 20-Oct-1965, 57 y.o.   MRN: [id number]  DOS:  12/28/2022 Type of visit - description: CPX  Here for CPX Multiple other issues addressed. Neuropathy is getting worse.  Also complaining of pain at both heels, he thinks his planta fasciitis Ambulatory BPs elevated.  Denies chest pain or difficulty breathing. Stamina is somewhat decreased. Denies cough or quizzing. Occasionally sees blood in the toilet paper after a bowel movement, thinks related to hemorrhoids.  Had a couple of falls at work, he works in [employer], reports he is stumble, "I think is my weight, I'm not very agile".    Review of Systems See above   Past Medical History:  Diagnosis Date   Allergy    Asthma mild   no inhaler   Gallbladder polyp 2018   6mm polyp   GERD (gastroesophageal reflux disease) occasional   watches diet and takes diet   Hemorrhoids    h/o Cscope    History of MRSA infection few yrs ago while in hospital in [place]   Meniscus tear 08/2010   to have surgery-- left knee   OSA on CPAP    Sleep apnea    Urolithiasis 2007-2008   [place] left [place]  3 surgeries ; has passed stones since then     Past Surgical History:  Procedure Laterality Date   CYSTOSCOPY/RETROGRADE/URETEROSCOPY/STONE EXTRACTION WITH BASKET  2007-- x2   KNEE ARTHROSCOPY  12/05/2010   Procedure: ARTHROSCOPY KNEE;  Surgeon: Fayrene Fearing P Aplington;  Location: [facility];  Service: Orthopedics;  Laterality: Left;  LEFT KNEE ARTHROSCOPY WITH PARTIAL MEDIAL  MENISECTOMY, Total lateral menisectomy, Shaving of Medial Femoral condyle   leg surgery  1977- age 63   Left , s/p traction, body cast    Current Outpatient Medications  Medication Instructions   aspirin EC 81 mg, Daily   bisacodyl (DULCOLAX) 5 mg, As needed   calcium carbonate (OS-CAL - DOSED IN MG OF ELEMENTAL CALCIUM) 1250 (500 Ca) MG tablet 1 tablet   co-enzyme Q-10 30 mg, 3 times daily    dicyclomine (BENTYL) 10 mg, Oral, 2 times daily   docusate sodium (STOOL SOFTENER) 100 mg, As needed   FIBER PO 1 Dose, Daily   gabapentin (NEURONTIN) 300 mg, Oral, 3 times daily   hydrocortisone (ANUSOL-HC) 25 mg, Rectal, 2 times daily PRN   Multiple Vitamin (MULTIVITAMIN) capsule 1 capsule, Daily   naproxen sodium (ALEVE) 220 mg, 2 times daily PRN   omega-3 acid ethyl esters (LOVAZA) 1 g capsule 2 times daily   pantoprazole (PROTONIX) 20 mg, Oral, Daily   polyethylene glycol (MIRALAX / GLYCOLAX) 17 g, Daily   vitamin C 1,000 mg, Daily   zinc gluconate 50 mg, Daily       Objective:   Physical Exam BP (!) 148/88   Pulse 86   Temp 97.6 F (36.4 C) (Oral)   Resp 18   Ht 5' 11.75" (1.822 m)   Wt (!) 303 lb 4 oz (137.6 kg)   SpO2 96%   BMI 41.42 kg/m  General: Well developed, NAD, BMI noted Neck: No  thyromegaly  HEENT:  Normocephalic . Face symmetric, atraumatic Lungs:  CTA B Normal respiratory effort, no intercostal retractions, no accessory muscle use. Heart: RRR,  no murmur.  Abdomen:  Not distended, soft, non-tender. No rebound or rigidity.   Lower extremities: no pretibial edema bilaterally.  Flat-footed.  Slightly TTP at the base of both  heels.  Skin: Exposed areas without rash. Not pale. Not jaundice Neurologic:  alert & oriented X3.  Speech normal, gait appropriate for age and unassisted Strength symmetric and appropriate for age.  Psych: Cognition and judgment appear intact.  Cooperative with normal attention span and concentration.  Behavior appropriate. No anxious or depressed appearing.     Assessment    ASSESSMENT Asthma GERD Chronic sinusitis, Allergies Urolithiasis GI: --Recurrent  diverticulitis-- saw GI 09/11/2021, they rex conservative treatment for his sigmoid stricture d/t recurrent diverticulitis.  GB polyp, saw surgery/2018, rec a [place] in 6 months. Surgery if polyp >  10 mm.  [place] 06/2021: Polyps stable, no further routine [place] needed Smoker:  quit 08/2018 Chronic sinusitis: Seen by ENT 2018, Dr Christell Constant, rx medical treatment, no polyps OSA dx 07/2019 FH colon cancer, breast cancer Neuropathy, Burning feet syndrome, perioral numbness:  NCS 10-2020 , saw neuro, Rx gaba  PLAN: Here for CPX Td:  2023 Shingrix No. 2 today -Vaccines are recommended: Flu shot,  COVID  -WUJ:WJXBJY 2006 @ [place] (d/t diverticulitis?); Cscope 07-2017,  Rectal polyp, internal and external hemorrhoids grade 4.  C-scope 05/2021, next per GI -prostate ca screening: DRE normal last year, check a PSA. -Labs: CMP FLP CBC A1c TSH PSA  -Diet exercise: Discussed  - Lung cancer screening: smoked 22-pack-year, quit 2020, previous referrals for CT failed  - Healthcare POA: Information provided.   GERD: Not an issue, takes occasional Pepcid OTC. OSA: Uses CPAP regularly. Morbid obesity: Recommend Zepbound, 1 shot weekly, start with a low-dose gradually go up, side effect discussed.  Encouraged to work on his diet. HTN: Ambulatory BPs have been elevated and they 150/90 level, BP is slightly elevated today, I think she is developing hypertension.  Start amlodipine 5 mg daily. Neuropathy: Getting worse, self increase gabapentin 300 mg to 2 tablets 3 times daily, it helped some, sent Rx for gabapentin 600 mg 3 times daily.  We agreed to refer him to neurology for reevaluation. Frequent falls: I am concerned about the issue, probably a combination of BMI and neuropathy, strongly encouraged not to put himself in situations that can put him at risk. Plantar fasciitis, pes planus: Refer to podiatry, may benefit from shoe inserts.  Stretching encouraged. Social: He works out of town most of the time  RTC 3 months    === Asthma: Not an issue at this time. Neuropathy, burning feet syndrome: Ongoing burning at the feet nocturnally, also occasionally burning at the hands.  Patient would like to increase gabapentin to 300 mg 3 times daily.  That is reasonable, prescription  sent. Genetic counseling: FH colon & breast cancer. Saw a geneticist, tests were done, results essentially normal. Shingrix: Due for a booster however has no medical insurance.  Will discuss at the next opportunity. DOE: At baseline, encouraged gradual increase in exercise RTC October 2024 for CPX

## 2022-12-28 NOTE — Patient Instructions (Addendum)
Vaccines I recommend: Covid booster Flu shot    Obesity: Watch your diet closely I sent a prescription for Zepbound.  Is a weekly shot, will help you lose weight.  Hopefully your insurance will approve it.  You have high blood pressure. Start amlodipine 5 mg 1 tablet every day.   Check the  blood pressure regularly Blood pressure goal:  between 110/65 and  135/85. If it is consistently higher or lower, let me know  For planta fasciitis and flatfeet: We are referring you to the podiatrist  Be sure you stretch daily.    GO TO THE LAB : Get the blood work     Next visit with me in 3 months Please schedule it at the front desk       "Health Care Power of attorney" ,  "Living will" (Advance care planning documents)  If you already have a living will or healthcare power of attorney, is recommended you bring the copy to be scanned in your chart.   The document will be available to all the doctors you see in the system.  Advance care planning is a process that supports adults in  understanding and sharing their preferences regarding future medical care.  The patient's preferences are recorded in documents called Advance Directives and the can be modified at any time while the patient is in full mental capacity.   If you don't have one, please consider create one.      More information at: [email or web address]

## 2022-12-29 ENCOUNTER — Encounter: Payer: Self-pay | Admitting: Internal Medicine

## 2022-12-29 LAB — CBC WITH DIFFERENTIAL/PLATELET
Absolute Lymphocytes: 2950 {cells}/uL (ref 850–3900)
Absolute Monocytes: 684 {cells}/uL (ref 200–950)
Basophils Absolute: 35 {cells}/uL (ref 0–200)
Basophils Relative: 0.3 %
Eosinophils Absolute: 283 {cells}/uL (ref 15–500)
Eosinophils Relative: 2.4 %
HCT: 42.7 % (ref 38.5–50.0)
Hemoglobin: 14.1 g/dL (ref 13.2–17.1)
MCH: 28.7 pg (ref 27.0–33.0)
MCHC: 33 g/dL (ref 32.0–36.0)
MCV: 87 fL (ref 80.0–100.0)
MPV: 10.7 fL (ref 7.5–12.5)
Monocytes Relative: 5.8 %
Neutro Abs: 7847 {cells}/uL — ABNORMAL HIGH (ref 1500–7800)
Neutrophils Relative %: 66.5 %
Platelets: 189 10*3/uL (ref 140–400)
RBC: 4.91 10*6/uL (ref 4.20–5.80)
RDW: 12.9 % (ref 11.0–15.0)
Total Lymphocyte: 25 %
WBC: 11.8 10*3/uL — ABNORMAL HIGH (ref 3.8–10.8)

## 2022-12-29 LAB — HEMOGLOBIN A1C
Hgb A1c MFr Bld: 5.6 %{Hb} (ref <5.7)
Mean Plasma Glucose: 114 mg/dL
eAG (mmol/L): 6.3 mmol/L

## 2022-12-29 LAB — COMPREHENSIVE METABOLIC PANEL
AG Ratio: 1.4 (calc) (ref 1.0–2.5)
ALT: 17 U/L (ref 9–46)
AST: 25 U/L (ref 10–35)
Albumin: 4.3 g/dL (ref 3.6–5.1)
Alkaline phosphatase (APISO): 78 U/L (ref 35–144)
BUN: 19 mg/dL (ref 7–25)
CO2: 29 mmol/L (ref 20–32)
Calcium: 9.6 mg/dL (ref 8.6–10.3)
Chloride: 103 mmol/L (ref 98–110)
Creat: 1.29 mg/dL (ref 0.70–1.30)
Globulin: 3 g/dL (ref 1.9–3.7)
Glucose, Bld: 86 mg/dL (ref 65–99)
Potassium: 4.3 mmol/L (ref 3.5–5.3)
Sodium: 139 mmol/L (ref 135–146)
Total Bilirubin: 0.6 mg/dL (ref 0.2–1.2)
Total Protein: 7.3 g/dL (ref 6.1–8.1)

## 2022-12-29 LAB — LIPID PANEL
Cholesterol: 134 mg/dL (ref <200)
HDL: 35 mg/dL — ABNORMAL LOW (ref >=40)
LDL Cholesterol (Calc): 79 mg/dL
Non-HDL Cholesterol (Calc): 99 mg/dL (ref <130)
Total CHOL/HDL Ratio: 3.8 (calc) (ref <5.0)
Triglycerides: 118 mg/dL (ref <150)

## 2022-12-29 LAB — PSA: PSA: 0.78 ng/mL (ref <=4.00)

## 2022-12-29 LAB — TSH: TSH: 1.81 m[IU]/L (ref 0.40–4.50)

## 2022-12-29 NOTE — Assessment & Plan Note (Signed)
Here for CPX Td:  2023 Shingrix No. 2 today -Vaccines are recommended: Flu shot,  COVID -WUJ:WJXBJY 2006 @ [place] (d/t diverticulitis?); Cscope 07-2017,  Rectal polyp, internal and external hemorrhoids grade 4.  C-scope 05/2021, next per GI -prostate ca screening: no sxs,check a PSA. -Labs: CMP FLP CBC A1c TSH PSA -Diet exercise: Discussed - Lung cancer screening: smoked 22-pack-year, quit 2020, previous referrals for CT failed.   - Healthcare POA: Information provided.

## 2022-12-29 NOTE — Assessment & Plan Note (Signed)
Here for CPX GERD: Not an issue, takes occasional Pepcid OTC. OSA: Uses CPAP regularly. Morbid obesity: Recommend Zepbound, 1 shot weekly, start with a low-dose gradually go up, side effect discussed.  Encouraged to work on his diet. HTN: Ambulatory BPs have been elevated and they 150/90 level, BP is slightly elevated today, I think she is developing hypertension.  Start amlodipine 5 mg daily. Neuropathy: Getting worse, self increase gabapentin 300 mg TID to 600 mg TID, helped some, sent Rx for gabapentin 600 mg TID. We agreed to refer him to neurology for reevaluation. Frequent falls: I am concerned about the issue, probably a combination of BMI and neuropathy, strongly encouraged not to put himself in situations that increase fall risk Plantar fasciitis, pes planus: Refer to podiatry, may benefit from shoe inserts.  Stretching encouraged. Social: He works out of town most of the time  RTC 3 months

## 2022-12-31 ENCOUNTER — Other Ambulatory Visit: Payer: Self-pay | Admitting: Internal Medicine

## 2022-12-31 ENCOUNTER — Telehealth: Payer: Self-pay

## 2022-12-31 NOTE — Telephone Encounter (Signed)
Noted, patient aware via blood work results.

## 2022-12-31 NOTE — Addendum Note (Signed)
Addended byConrad Richland D on: 12/31/2022 07:50 AM   Modules accepted: Orders

## 2022-12-31 NOTE — Telephone Encounter (Signed)
PA initiated via Covermymeds; KEY: [id number]. Awaiting determination.

## 2022-12-31 NOTE — Telephone Encounter (Signed)
PA denied. Plan exclusion.    Denied. This health benefit plan does not cover the following services, supplies, drugs or charges: Any treatment or regimen, medical or surgical, for the purpose of reducing or controlling the weight of the member, or for the treatment of obesity, except for surgical treatment of morbid obesity, or as specifically covered by this health benefit plan. 

## 2023-01-09 ENCOUNTER — Other Ambulatory Visit: Payer: Self-pay

## 2023-01-09 ENCOUNTER — Emergency Department (HOSPITAL_COMMUNITY): Payer: BC Managed Care – PPO

## 2023-01-09 ENCOUNTER — Encounter (HOSPITAL_COMMUNITY): Payer: Self-pay

## 2023-01-09 ENCOUNTER — Emergency Department (HOSPITAL_COMMUNITY)
Admission: EM | Admit: 2023-01-09 | Discharge: 2023-01-09 | Disposition: A | Discharge status: Home / Self Care | Payer: BC Managed Care – PPO | Attending: Emergency Medicine | Admitting: Emergency Medicine

## 2023-01-09 ENCOUNTER — Emergency Department (HOSPITAL_COMMUNITY): Payer: BC Managed Care – PPO | Admitting: Anesthesiology

## 2023-01-09 ENCOUNTER — Telehealth (HOSPITAL_COMMUNITY): Payer: Self-pay | Admitting: Emergency Medicine

## 2023-01-09 ENCOUNTER — Encounter (HOSPITAL_COMMUNITY)
Admission: EM | Disposition: A | Discharge status: Home / Self Care | Payer: Self-pay | Source: Home / Self Care | Attending: Emergency Medicine

## 2023-01-09 DIAGNOSIS — R109 Unspecified abdominal pain: Secondary | ICD-10-CM | POA: Diagnosis not present

## 2023-01-09 DIAGNOSIS — N132 Hydronephrosis with renal and ureteral calculous obstruction: Secondary | ICD-10-CM | POA: Diagnosis not present

## 2023-01-09 DIAGNOSIS — N200 Calculus of kidney: Secondary | ICD-10-CM

## 2023-01-09 DIAGNOSIS — K5792 Diverticulitis of intestine, part unspecified, without perforation or abscess without bleeding: Secondary | ICD-10-CM

## 2023-01-09 DIAGNOSIS — R1031 Right lower quadrant pain: Secondary | ICD-10-CM | POA: Diagnosis not present

## 2023-01-09 DIAGNOSIS — K5732 Diverticulitis of large intestine without perforation or abscess without bleeding: Secondary | ICD-10-CM | POA: Diagnosis not present

## 2023-01-09 DIAGNOSIS — Z7982 Long term (current) use of aspirin: Secondary | ICD-10-CM | POA: Diagnosis not present

## 2023-01-09 LAB — URINALYSIS, ROUTINE W REFLEX MICROSCOPIC
Bacteria, UA: NONE SEEN
Bilirubin Urine: NEGATIVE
Glucose, UA: NEGATIVE mg/dL
Ketones, ur: 5 mg/dL — AB
Leukocytes,Ua: NEGATIVE
Nitrite: NEGATIVE
Protein, ur: 30 mg/dL — AB
RBC / HPF: >50 RBC/hpf (ref 0–5)
Specific Gravity, Urine: 1.023 (ref 1.005–1.030)
pH: 5 (ref 5.0–8.0)

## 2023-01-09 LAB — I-STAT CHEM 8, ED
BUN: 19 mg/dL (ref 6–20)
Calcium, Ion: 1.18 mmol/L (ref 1.15–1.40)
Chloride: 104 mmol/L (ref 98–111)
Creatinine, Ser: 1.3 mg/dL — ABNORMAL HIGH (ref 0.61–1.24)
Glucose, Bld: 153 mg/dL — ABNORMAL HIGH (ref 70–99)
HCT: 44 % (ref 39.0–52.0)
Hemoglobin: 15 g/dL (ref 13.0–17.0)
Potassium: 3.9 mmol/L (ref 3.5–5.1)
Sodium: 140 mmol/L (ref 135–145)
TCO2: 23 mmol/L (ref 22–32)

## 2023-01-09 LAB — CBC WITH DIFFERENTIAL/PLATELET
Abs Immature Granulocytes: 0.07 10*3/uL (ref 0.00–0.07)
Basophils Absolute: 0.1 10*3/uL (ref 0.0–0.1)
Basophils Relative: 0 %
Eosinophils Absolute: 0 10*3/uL (ref 0.0–0.5)
Eosinophils Relative: 0 %
HCT: 43.4 % (ref 39.0–52.0)
Hemoglobin: 14.5 g/dL (ref 13.0–17.0)
Immature Granulocytes: 0 %
Lymphocytes Relative: 7 %
Lymphs Abs: 1.2 10*3/uL (ref 0.7–4.0)
MCH: 29.8 pg (ref 26.0–34.0)
MCHC: 33.4 g/dL (ref 30.0–36.0)
MCV: 89.3 fL (ref 80.0–100.0)
Monocytes Absolute: 0.4 10*3/uL (ref 0.1–1.0)
Monocytes Relative: 3 %
Neutro Abs: 15.2 10*3/uL — ABNORMAL HIGH (ref 1.7–7.7)
Neutrophils Relative %: 90 %
Platelets: 191 10*3/uL (ref 150–400)
RBC: 4.86 MIL/uL (ref 4.22–5.81)
RDW: 13.4 % (ref 11.5–15.5)
WBC: 16.9 10*3/uL — ABNORMAL HIGH (ref 4.0–10.5)
nRBC: 0 % (ref 0.0–0.2)

## 2023-01-09 SURGERY — CYSTOSCOPY WITH STENT PLACEMENT
Anesthesia: General | Laterality: Right

## 2023-01-09 MED ORDER — ONDANSETRON 8 MG PO TBDP: 8.0000 mg | ORAL_TABLET | Freq: Three times a day (TID) | ORAL | 0 refills | Status: DC | PRN

## 2023-01-09 MED ORDER — AMOXICILLIN-POT CLAVULANATE 875-125 MG PO TABS: 1.0000 | ORAL_TABLET | Freq: Two times a day (BID) | ORAL | 0 refills | Status: DC

## 2023-01-09 MED ORDER — HYDROMORPHONE HCL 1 MG/ML IJ SOLN
1.0000 mg | Freq: Once | INTRAMUSCULAR | Status: AC
  Administered 2023-01-09: 1 mg via INTRAVENOUS
  Filled 2023-01-09: qty 1

## 2023-01-09 MED ORDER — KETOROLAC TROMETHAMINE 30 MG/ML IJ SOLN
15.0000 mg | Freq: Once | INTRAMUSCULAR | Status: AC
  Administered 2023-01-09: 15 mg via INTRAVENOUS
  Filled 2023-01-09: qty 1

## 2023-01-09 MED ORDER — KETOROLAC TROMETHAMINE 10 MG PO TABS: 10.0000 mg | ORAL_TABLET | Freq: Four times a day (QID) | ORAL | 0 refills | Status: DC | PRN

## 2023-01-09 MED ORDER — METOCLOPRAMIDE HCL 5 MG/ML IJ SOLN
5.0000 mg | Freq: Once | INTRAMUSCULAR | Status: AC
  Administered 2023-01-09: 5 mg via INTRAVENOUS
  Filled 2023-01-09: qty 2

## 2023-01-09 MED ORDER — FENTANYL CITRATE (PF) 100 MCG/2ML IJ SOLN
INTRAMUSCULAR | Status: AC
  Filled 2023-01-09: qty 2

## 2023-01-09 MED ORDER — OXYCODONE-ACETAMINOPHEN 5-325 MG PO TABS: 1.0000 | ORAL_TABLET | Freq: Four times a day (QID) | ORAL | 0 refills | Status: DC | PRN

## 2023-01-09 MED ORDER — SODIUM CHLORIDE 0.9 % IV BOLUS
1000.0000 mL | Freq: Once | INTRAVENOUS | Status: AC
  Administered 2023-01-09: 1000 mL via INTRAVENOUS

## 2023-01-09 MED ORDER — MIDAZOLAM HCL 2 MG/2ML IJ SOLN
INTRAMUSCULAR | Status: AC
  Filled 2023-01-09: qty 2

## 2023-01-09 MED ORDER — ONDANSETRON HCL 4 MG/2ML IJ SOLN: 4.0000 mg | Freq: Once | INTRAMUSCULAR | Status: DC

## 2023-01-09 MED ORDER — TAMSULOSIN HCL 0.4 MG PO CAPS: 0.4000 mg | ORAL_CAPSULE | Freq: Every day | ORAL | 0 refills | Status: DC

## 2023-01-09 MED ORDER — DEXAMETHASONE SODIUM PHOSPHATE 10 MG/ML IJ SOLN
INTRAMUSCULAR | Status: AC
Start: 2023-01-09 — End: ?
  Filled 2023-01-09: qty 1

## 2023-01-09 MED ORDER — ONDANSETRON HCL 4 MG/2ML IJ SOLN
4.0000 mg | Freq: Once | INTRAMUSCULAR | Status: AC
  Administered 2023-01-09: 4 mg via INTRAVENOUS
  Filled 2023-01-09: qty 2

## 2023-01-09 MED ORDER — LORAZEPAM 2 MG/ML IJ SOLN
0.5000 mg | Freq: Once | INTRAMUSCULAR | Status: AC
  Administered 2023-01-09: 0.5 mg via INTRAVENOUS
  Filled 2023-01-09: qty 1

## 2023-01-09 MED ORDER — ONDANSETRON HCL 4 MG/2ML IJ SOLN
INTRAMUSCULAR | Status: AC
Start: 2023-01-09 — End: ?
  Filled 2023-01-09: qty 2

## 2023-01-09 MED ORDER — SODIUM CHLORIDE 0.9 % IV SOLN: INTRAVENOUS | Status: DC

## 2023-01-09 MED ORDER — SODIUM CHLORIDE 0.9 % IV SOLN
3.0000 g | Freq: Once | INTRAVENOUS | Status: AC
  Administered 2023-01-09: 3 g via INTRAVENOUS
  Filled 2023-01-09: qty 8

## 2023-01-09 MED ORDER — LIDOCAINE HCL (PF) 2 % IJ SOLN
INTRAMUSCULAR | Status: AC
  Filled 2023-01-09: qty 5

## 2023-01-09 MED ORDER — PROPOFOL 10 MG/ML IV BOLUS
INTRAVENOUS | Status: AC
  Filled 2023-01-09: qty 20

## 2023-01-09 MED ORDER — DIPHENHYDRAMINE HCL 50 MG/ML IJ SOLN
12.5000 mg | Freq: Once | INTRAMUSCULAR | Status: AC
  Administered 2023-01-09: 12.5 mg via INTRAVENOUS
  Filled 2023-01-09: qty 1

## 2023-01-09 NOTE — ED Notes (Signed)
Placed ice pack on right flank

## 2023-01-09 NOTE — ED Notes (Signed)
Applied ice pack to right flank area

## 2023-01-09 NOTE — ED Notes (Signed)
Removed ice pack, pt stated pain is improving

## 2023-01-09 NOTE — Consult Note (Signed)
I have been asked to see the patient by Dr. Lorre Nick, for evaluation and management of right proximal obstructing ureteral stone.  History of present illness: 57 year old male presented to the emergency department after 5 hours of poorly controlled right-sided flank pain and associated nausea and vomiting.  Denies any fevers or chills.  Denies any voiding symptoms including dysuria or gross hematuria.  He is not having any urgency.  His pain is in the right flank radiating around to the right groin.  In the emergency department the patient was noted to have an elevation in his white blood cell count to 16.9K.  His renal function essentially stable.  His urine analysis is not back.  He had a CT scan that demonstrated a 10 mm right proximal ureteral stone with associated hydronephrosis proximally.  He also had some nonobstructing stones in the left kidney.  Given the size of the stone in the difficulty with controlling the patient's pain I was consulted for further evaluation and management.  The patient sees Dr. Pete Glatter for his kidney stones.  He was last treated several years prior.  He has had shockwave lithotripsy before.  Review of systems: A 12 point comprehensive review of systems was obtained and is negative unless otherwise stated in the history of present illness.  Patient Active Problem List   Diagnosis Date Noted   Genetic testing 11/14/2021   Neuropathy 06/06/2020   Essential hypertension 05/16/2020   Gallbladder polyp 06/24/2017   Diverticulitis 06/24/2017   PCP NOTES >>>>>>>>>>>>>>>>>. 03/21/2016   Rhinitis 09/24/2013   Hemorrhoids 08/16/2011   Annual physical exam 09/19/2010   Urolithiasis     No current facility-administered medications on file prior to encounter.   Current Outpatient Medications on File Prior to Encounter  Medication Sig Dispense Refill   acetaminophen (TYLENOL) 325 MG tablet Take 650 mg by mouth every 6 (six) hours as needed for moderate pain  (pain score 4-6).     amLODipine (NORVASC) 5 MG tablet Take 1 tablet (5 mg total) by mouth daily. 90 tablet 0   aspirin EC 81 MG tablet Take 81 mg by mouth daily. Swallow whole.     co-enzyme Q-10 30 MG capsule Take 30 mg by mouth daily.     FIBER PO Take 3 tablets by mouth daily. metamucil     gabapentin (NEURONTIN) 600 MG tablet Take 1 tablet (600 mg total) by mouth 3 (three) times daily. 270 tablet 1   Multiple Vitamin (MULTIVITAMIN) capsule Take 1 capsule by mouth daily.     naproxen sodium (ALEVE) 220 MG tablet Take 660 mg by mouth as needed (pain).     omega-3 acid ethyl esters (LOVAZA) 1 g capsule Take 1 g by mouth daily.     moxifloxacin (VIGAMOX) 0.5 % ophthalmic solution Apply to eye. (Patient not taking: Reported on 01/09/2023)      Past Medical History:  Diagnosis Date   Allergy    Asthma mild   no inhaler   Gallbladder polyp 2018   6mm polyp   GERD (gastroesophageal reflux disease) occasional   watches diet and takes diet   Hemorrhoids    h/o Cscope    History of MRSA infection few yrs ago while in hospital in [place]   Meniscus tear 08/2010   to have surgery-- left knee   OSA on CPAP    Sleep apnea    Urolithiasis 2007-2008   ([place] left kidney  3 surgeries ; has passed stones since then  Past Surgical History:  Procedure Laterality Date   CYSTOSCOPY/RETROGRADE/URETEROSCOPY/STONE EXTRACTION WITH BASKET  2007-- x2   KNEE ARTHROSCOPY  12/05/2010   Procedure: ARTHROSCOPY KNEE;  Surgeon: Illene Labrador Aplington;  Location: [facility];  Service: Orthopedics;  Laterality: Left;  LEFT KNEE ARTHROSCOPY WITH PARTIAL MEDIAL  MENISECTOMY, Total lateral menisectomy, Shaving of Medial Femoral condyle   leg surgery  1977- age 35   Left , s/p traction, body cast    Social History   Tobacco Use   Smoking status: Former    Current packs/day: 0.00    Types: Cigarettes    Quit date: 08/2018    Years since quitting: 4.4   Smokeless tobacco: Never   Tobacco  comments:    Smoke on-off    1 ppd age 75 to 60 =16 pack-year    1 ppd [age] = 6 pack-year     Total: 22 pack-year    Quit 2020  Vaping Use   Vaping status: Never Used  Substance Use Topics   Alcohol use: Not Currently    Comment: None Since 2017 MB RN   Drug use: No    Family History  Problem Relation Age of Onset   Stroke Mother        Judie Petit d/t brain tumor    Cancer Mother        brain, poss. glioblastoma   Coronary artery disease Father        F, MI 63 y/o   Colon cancer Brother 63       dx 12-2020   Colon cancer Paternal Uncle        dx 30s-80s   Colon cancer Maternal Grandmother        dx 8-s   Diabetes Neg Hx    Prostate cancer Neg Hx    Esophageal cancer Neg Hx     PE: Vitals:   01/09/23 0702 01/09/23 0703 01/09/23 0806 01/09/23 1121  BP: (!) 179/83  (!) 147/84 129/67  Pulse: 96  90 (!) 109  Resp: 20  17 (!) 22  Temp:  (!) 97.5 F (36.4 C)  98.5 F (36.9 C)  TempSrc:  Oral  Oral  SpO2: 94%  96% 96%  Weight:      Height:       Patient appears to be in moderate distress patient is alert and oriented x3 Atraumatic normocephalic head No cervical or supraclavicular lymphadenopathy appreciated No increased work of breathing, no audible wheezes/rhonchi Regular sinus rhythm/rate Abdomen is soft, nontender, nondistended, right CVA tenderness Lower extremities are symmetric without appreciable edema Grossly neurologically intact No identifiable skin lesions  Recent Labs    01/09/23 0930 01/09/23 1038  WBC 16.9*  --   HGB 14.5 15.0  HCT 43.4 44.0   Recent Labs    01/09/23 1038  NA 140  K 3.9  CL 104  GLUCOSE 153*  BUN 19  CREATININE 1.30*   No results for input(s): "LABPT", "INR" in the last 72 hours. No results for input(s): "LABURIN" in the last 72 hours. Results for orders placed or performed in visit on 12/19/19  Novel Coronavirus, NAA ([facility])     Status: Abnormal   Collection Time: 12/19/19  9:25 AM   Specimen: Nasopharyngeal(NP) swabs  in vial transport medium   Nasopharynge  Result Value Ref Range Status   SARS-CoV-2, NAA Detected (A) Not Detected Final    Comment: Patients who have a positive COVID-19 test result may now have treatment options. Treatment options are available  for patients with mild to moderate symptoms and for hospitalized patients. Visit our website at [email or web address] for resources and information. This nucleic acid amplification test was developed and its performance characteristics determined by [employer]. Nucleic acid amplification tests include RT-PCR and TMA. This test has not been FDA cleared or approved. This test has been authorized by FDA under an Emergency Use Authorization (EUA). This test is only authorized for the duration of time the declaration that circumstances exist justifying the authorization of the emergency use of in vitro diagnostic tests for detection of SARS-CoV-2 virus and/or diagnosis of COVID-19 infection under section 564(b)(1) of the Act, 21 U.S.C. 782NFA-2(Z) (1), unless the authorization is terminated or revoked sooner. When diagnostic testing is negativ e, the possibility of a false negative result should be considered in the context of a patient's recent exposures and the presence of clinical signs and symptoms consistent with COVID-19. An individual without symptoms of COVID-19 and who is not shedding SARS-CoV-2 virus would expect to have a negative (not detected) result in this assay.   SARS-COV-2, NAA 2 DAY TAT     Status: None   Collection Time: 12/19/19  9:25 AM   Nasopharynge  Result Value Ref Range Status   SARS-CoV-2, NAA 2 DAY TAT Performed  Final    Imaging: I have independently reviewed the patient's CT scan and discussed the findings with the patient.  My impression is as above in the HPI.   Imp: The patient has a large obstructing right proximal ureteral stone without any clinical evidence of an infection.  His UA  still pending.  He does have some diverticulitis, which currently is considered uncomplicated.  His pain has been very difficult to control, but currently the patient's pain is better with high doses of IV narcotics.   Recommendations: I recommended that we proceed to the operating room for stent given the size of the stone.  However, given the patient's financial concerns and his high deductible he wanted to wait until after the new year to proceed with a definitive treatment.  He does understand that he is likely to end up back in the emergency department, despite this he is going to take that chance.  I did recommend that the patient be given some Toradol in the emergency department discharged home with it.  I will alert Dr. Pete Glatter, perhaps we can get the patient set up for shockwave lithotripsy, which we have discussed already and he would be a good candidate for.  We may be able to get this set up for January 3.  Crist Fat

## 2023-01-09 NOTE — ED Provider Notes (Addendum)
[place] EMERGENCY DEPARTMENT AT [facility] Provider Note   CSN: [id number] Arrival date & time: 01/09/23  4098     History  Chief Complaint  Patient presents with   Flank Pain    Mario Jimenez is a 57 y.o. male.  57 year old male with history of kidney stones present with acute onset of right-sided flank pain which began this morning.  Pain is similar to prior kidney stones pain is been colicky in nature.  No urinary changes.  No fever or chills.  Pain is caused nausea and vomiting.  No treatment use prior to arrival       Home Medications Prior to Admission medications   Medication Sig Start Date End Date Taking? Authorizing Provider  amLODipine (NORVASC) 5 MG tablet Take 1 tablet (5 mg total) by mouth daily. 12/28/22   Wanda Plump, MD  Ascorbic Acid (VITAMIN C) 1000 MG tablet Take 1,000 mg by mouth daily.    [provider]  aspirin EC 81 MG tablet Take 81 mg by mouth daily. Swallow whole.    [provider]  bisacodyl (DULCOLAX) 5 MG EC tablet Take 5 mg by mouth as needed for moderate constipation.    [provider]  calcium carbonate (OS-CAL - DOSED IN MG OF ELEMENTAL CALCIUM) 1250 (500 Ca) MG tablet Take 1 tablet by mouth.    [provider]  co-enzyme Q-10 30 MG capsule Take 30 mg by mouth 3 (three) times daily.    [provider]  dicyclomine (BENTYL) 10 MG capsule Take 1 capsule (10 mg total) by mouth 2 (two) times daily. Patient not taking: Reported on 12/28/2022 03/27/21   Lynann Bologna, MD  docusate sodium (STOOL SOFTENER) 100 MG capsule Take 100 mg by mouth as needed for mild constipation. Patient not taking: Reported on 12/28/2022    [provider]  FIBER PO Take 1 Dose by mouth daily.    [provider]  gabapentin (NEURONTIN) 600 MG tablet Take 1 tablet (600 mg total) by mouth 3 (three) times daily. 12/28/22   Wanda Plump, MD  hydrocortisone (ANUSOL-HC) 25 MG suppository Place 1  suppository (25 mg total) rectally 2 (two) times daily as needed for hemorrhoids. Patient not taking: Reported on 12/28/2022 02/24/21   Wanda Plump, MD  Multiple Vitamin (MULTIVITAMIN) capsule Take 1 capsule by mouth daily.    [provider]  naproxen sodium (ALEVE) 220 MG tablet Take 220 mg by mouth 2 (two) times daily as needed (pain).    [provider]  omega-3 acid ethyl esters (LOVAZA) 1 g capsule Take by mouth 2 (two) times daily.    [provider]  polyethylene glycol (MIRALAX / GLYCOLAX) 17 g packet Take 17 g by mouth daily.    [provider]  zinc gluconate 50 MG tablet Take 50 mg by mouth daily.    [provider]      Allergies    Contrast media [iodinated contrast media], Ioxaglate, and Sulfa antibiotics    Review of Systems   Review of Systems  All other systems reviewed and are negative.   Physical Exam Updated Vital Signs BP (!) 179/83 (BP Location: Left Arm)   Pulse 96   Temp (!) 97.5 F (36.4 C) (Oral)   Resp 20   Ht 1.803 m (5\' 11" )   Wt 136.1 kg   SpO2 94%   BMI 41.84 kg/m  Physical Exam Vitals and nursing note reviewed.  Constitutional:  General: He is not in acute distress.    Appearance: Normal appearance. He is well-developed. He is not toxic-appearing.  HENT:     Head: Normocephalic and atraumatic.  Eyes:     General: Lids are normal.     Conjunctiva/sclera: Conjunctivae normal.     Pupils: Pupils are equal, round, and reactive to light.  Neck:     Thyroid: No thyroid mass.     Trachea: No tracheal deviation.  Cardiovascular:     Rate and Rhythm: Normal rate and regular rhythm.     Heart sounds: Normal heart sounds. No murmur heard.    No gallop.  Pulmonary:     Effort: Pulmonary effort is normal. No respiratory distress.     Breath sounds: Normal breath sounds. No stridor. No decreased breath sounds, wheezing, rhonchi or rales.  Abdominal:     General: There is no distension.      Palpations: Abdomen is soft.     Tenderness: There is no abdominal tenderness. There is no rebound.  Musculoskeletal:        General: No tenderness. Normal range of motion.     Cervical back: Normal range of motion and neck supple.  Skin:    General: Skin is warm and dry.     Findings: No abrasion or rash.  Neurological:     Mental Status: He is alert and oriented to person, place, and time. Mental status is at baseline.     GCS: GCS eye subscore is 4. GCS verbal subscore is 5. GCS motor subscore is 6.     Cranial Nerves: No cranial nerve deficit.     Sensory: No sensory deficit.     Motor: Motor function is intact.  Psychiatric:        Attention and Perception: Attention normal.        Speech: Speech normal.        Behavior: Behavior normal.     ED Results / Procedures / Treatments   Labs (all labs ordered are listed, but only abnormal results are displayed) Labs Reviewed  URINALYSIS, ROUTINE W REFLEX MICROSCOPIC    EKG None  Radiology No results found.  Procedures Procedures    Medications Ordered in ED Medications  HYDROmorphone (DILAUDID) injection 1 mg (has no administration in time range)  metoCLOPramide (REGLAN) injection 5 mg (has no administration in time range)  diphenhydrAMINE (BENADRYL) injection 12.5 mg (has no administration in time range)  LORazepam (ATIVAN) injection 0.5 mg (has no administration in time range)    ED Course/ Medical Decision Making/ A&P                                 Medical Decision Making Amount and/or Complexity of Data Reviewed Labs: ordered. Radiology: ordered.  Risk Prescription drug management.   Patient given IV hydromorphone x 2 for pain.  Also given Reglan and Benadryl.  CT renal study shows a 10 mm right-sided kidney stone with moderate hydronephrosis.  Patient's pain is uncontrolled at this time.  Will consult urology for likely stent placement  12:40 PM Patient was seen by urology, Dr. Marlou Porch, plan was for  patient to go to the OR but he has since changed his mind.  Risk of permanent damage to his kidneys explained to him.  Patient has evidence of diverticulitis and was treated with IV Unasyn.  Patient given fluids and urinalysis without infection.  Pain controlled this time.  Able  to take oral liquids.  Plan will be to place patient on Toradol per recommendation of urology.  He will be contacted for outpatient lithotripsy.  Patient will also be placed on Augmentin for his diverticulitis.  Return precautions given       Final Clinical Impression(s) / ED Diagnoses Final diagnoses:  None    Rx / DC Orders ED Discharge Orders     None         Lorre Nick, MD 01/09/23 3016    Lorre Nick, MD 01/09/23 1241

## 2023-01-09 NOTE — ED Triage Notes (Signed)
Pt reports right side flank pain and abdominal pain that started last night and got worse this morning. Pt reports hx of kidney stones. No issues with urination.

## 2023-01-09 NOTE — Anesthesia Preprocedure Evaluation (Signed)
Anesthesia Evaluation  Patient identified by MRN, date of birth, ID band Patient awake    Reviewed: Allergy & Precautions, H&P , NPO status , Patient's Chart, lab work & pertinent test results, reviewed documented beta blocker date and time   Airway Mallampati: II  TM Distance: >3 FB Neck ROM: Full    Dental no notable dental hx.    Pulmonary asthma , sleep apnea , former smoker   breath sounds clear to auscultation       Cardiovascular hypertension,  Rhythm:Regular Rate:Normal  Denies cardiac symptoms   Neuro/Psych negative neurological ROS  negative psych ROS   GI/Hepatic Neg liver ROS,GERD  ,,  Endo/Other    Class 3 obesityobesity  Renal/GU negative Renal ROS  negative genitourinary   Musculoskeletal negative musculoskeletal ROS (+)    Abdominal  (+) + obese  Peds negative pediatric ROS (+)  Hematology negative hematology ROS (+)   Anesthesia Other Findings   Reproductive/Obstetrics negative OB ROS                             Anesthesia Physical Anesthesia Plan  ASA: 3 and emergent  Anesthesia Plan: General   Post-op Pain Management:    Induction: Intravenous  PONV Risk Score and Plan: 2 and Ondansetron, Midazolam and Treatment may vary due to age or medical condition  Airway Management Planned: LMA  Additional Equipment:   Intra-op Plan:   Post-operative Plan: Extubation in OR  Informed Consent: I have reviewed the patients History and Physical, chart, labs and discussed the procedure including the risks, benefits and alternatives for the proposed anesthesia with the patient or authorized representative who has indicated his/her understanding and acceptance.       Plan Discussed with: CRNA and Surgeon  Anesthesia Plan Comments:         Anesthesia Quick Evaluation

## 2023-01-21 ENCOUNTER — Ambulatory Visit: Payer: BC Managed Care – PPO | Admitting: Podiatry

## 2023-01-24 ENCOUNTER — Ambulatory Visit: Payer: BC Managed Care – PPO | Attending: Cardiovascular Disease | Admitting: Cardiovascular Disease

## 2023-01-24 ENCOUNTER — Encounter: Payer: Self-pay | Admitting: Cardiovascular Disease

## 2023-01-24 DIAGNOSIS — G4733 Obstructive sleep apnea (adult) (pediatric): Secondary | ICD-10-CM

## 2023-01-24 DIAGNOSIS — I10 Essential (primary) hypertension: Secondary | ICD-10-CM

## 2023-01-24 DIAGNOSIS — M25512 Pain in left shoulder: Secondary | ICD-10-CM

## 2023-01-24 MED ORDER — TAMSULOSIN HCL 0.4 MG PO CAPS: 0.4000 mg | ORAL_CAPSULE | Freq: Every day | ORAL | 3 refills | Status: AC

## 2023-01-24 MED ORDER — TAMSULOSIN HCL 0.4 MG PO CAPS: 0.4000 mg | ORAL_CAPSULE | Freq: Every day | ORAL | 3 refills | Status: DC

## 2023-01-24 NOTE — Progress Notes (Signed)
 Settings changed on CPAP device in clinic today per Dr. Burnard: Set Therapy mode to AutoSet Set Min Pressure to 8.0 cmH2O Set Max Pressure to 16.0 cmH2O Set EPR to Fulltime Set EPR level to 3 cmH2O Set Response to Soft Set Ramp enable to On Set Ramp time to 20 min Set Start pressure to 6.0 cmH2O

## 2023-01-24 NOTE — Progress Notes (Addendum)
 Cardiology Office Note    Date:  01/24/2023   ID:  ALVAN CULPEPPER, DOB [birth year], MRN [id number]  PCP:  Amon Aloysius BRAVO, MD  Cardiologist:  Debby Sor, MD   2 1/2 year F/U sleep evaluation, initially referred through the courtesy of Dr. Elnoria Amon  History of Present Illness:  Mario Jimenez is a 58 y.o. male who recently was evaluated Dr. Elnoria Amon for primary care.  Due to complaints of significant difficulty with sleep, with significant excessive daytime sleepiness and snoring, he was referred for a new sleep evaluation.  I saw him for initial evaluation on Jun 12, 2019, and last saw him on May 16, 2020.  He presents for follow-up evaluation.    Mario Jimenez has a prior longstanding history of tobacco use.  He started smoking at age 67 and quit at age 28.  He resumed smoking at age 82 and quit again at age 80.  He had recently moved from [facility] to [employer] and has a new job as a programmer, multimedia.  He previously had been unemployed for a year.  Upon new moving to his to live with a friend's month he admits to very poor sleep.  He does snore.  He has frequent awakenings.  His sleep is nonrestorative.  He wakes up gasping for breath and has nocturia at least 2-3 times per night.  He has significant daytime sleepiness.  Epworth Sleepiness Scale score was calculated in the office today and this endorsed at 14 consistent with significant daytime sleepiness as shown below:  Epworth Sleepiness Scale: Situation   Chance of Dozing/Sleeping (0 = never , 1 = slight chance , 2 = moderate chance , 3 = high chance )   sitting and reading 1   watching TV 2   sitting inactive in a public place 2   being a passenger in a motor vehicle for an hour or more 3   lying down in the afternoon 3   sitting and talking to someone 0   sitting quietly after lunch (no alcohol) 2   while stopped for a few minutes in traffic as the driver 1   Total Score  14   At times he is aware of frequent  leg movement with sleep but denies  painful restless legs, bruxism, hypnagogic hallucinations or cataplectic events.  He typically goes to bed at 9 PM and wakes up around 5 AM.  When I initially saw him, I had a long discussion with him and felt that his symptoms were most likely consistent with sleep apnea.  He had significant difficulty sleeping on his back, had a Mallampati scale of 4, and had significant fragmented's sleep, and frequent arousals.  I discussed with him cardiovascular ramifications of untreated sleep apnea as well as its effect on glucose metabolism, GERD, and inflammation.  He was referred for a sleep study which was done on July 26, 2019 was interpreted by Dr. Neysa.  He had severe obstructive sleep apnea during the diagnostic portion of a split-night protocol with an AHI of 47.6/h.  CPAP was titrated up to 15 cm of water.  He had mild oxygen desaturation to a near nadir of 88%.  He had loud snoring throughout the study.  CPAP set up date was September 28, 2019 with [facility] is his DME company.  A download was obtained from April 13, 2020 through May 12, 2020 which demonstrates 100% compliance with average use at 8 hours  and 20 minutes.  His CPAP auto unit is set at a minimum pressure of 8 with maximum pressure of 16.  His 95th percentile pressure is 11.2 with a maximum average pressure at 12.5.  AHI is excellent at 1.3.  He had used the F&P Simplus large mask at set up and in February 15, 2020 this was changed to a ResMed AirFit F 30 medium mask.  Since initiating therapy, he is sleeping much better and feels much more rested.  He still has nocturia approximately 2 times per night.  History is also notable for mild leg swelling, kidney stones, history of GERD with remote esophagitis, history of diverticulitis, as well as gallbladder polyps.  He had COVID in December 2021 at which time he had antibody infusion and also took steroids.    Since I last saw him, Mario Jimenez has  continued to use CPAP therapy.  He travels significantly result of work.  While at work he often may walk up to 6 miles per day.  He denies any chest pain.  He has continued to use CPAP therapy.  A download was obtained from December 24, 2022 through January 22, 2023.  Usage days is 100% which average use at 9 hours and 27 minutes.  He has a ResMed air sense 10 AutoSet unit which currently is set at a pressure range of 5 to 15 cm of water.  AHI is 2.5.  His 95th percentile pressure is 11.5 with maximum average pressure 13.2.  He has been using a nasal pillow mask.  He has had issues with plantar fasciitis.  He also has noted some sharp knifelike shoulder blade discomfort on the left and denies any exertional symptomatology.  He has not been successful with weight loss and admits to a recent 30 pound weight gain.  He presents for evaluation.   Past Medical History:  Diagnosis Date   Allergy    Asthma mild   no inhaler   Gallbladder polyp 2018   6mm polyp   GERD (gastroesophageal reflux disease) occasional   watches diet and takes diet   Hemorrhoids    h/o Cscope    History of MRSA infection few yrs ago while in hospital in [place]    Meniscus tear 08/2010   to have surgery-- left knee   OSA on CPAP    Sleep apnea    Urolithiasis 2007-2008   ([place] ) left kidney  3 surgeries ; has passed stones since then     Past Surgical History:  Procedure Laterality Date   CYSTOSCOPY/RETROGRADE/URETEROSCOPY/STONE EXTRACTION WITH BASKET  2007-- x2   KNEE ARTHROSCOPY  12/05/2010   Procedure: ARTHROSCOPY KNEE;  Surgeon: Lynwood P Aplington;  Location: [facility];  Service: Orthopedics;  Laterality: Left;  LEFT KNEE ARTHROSCOPY WITH PARTIAL MEDIAL  MENISECTOMY, Total lateral menisectomy, Shaving of Medial Femoral condyle   leg surgery  1977- age 67   Left , s/p traction, body cast    Current Medications: Outpatient Medications Prior to Visit  Medication Sig Dispense Refill   acetaminophen   (TYLENOL ) 325 MG tablet Take 650 mg by mouth every 6 (six) hours as needed for moderate pain (pain score 4-6).     amLODipine  (NORVASC ) 5 MG tablet Take 1 tablet (5 mg total) by mouth daily. 90 tablet 0   amoxicillin -clavulanate (AUGMENTIN ) 875-125 MG tablet Take 1 tablet by mouth every 12 (twelve) hours. 14 tablet 0   aspirin EC 81 MG tablet Take 81 mg by mouth daily. Swallow whole.  co-enzyme Q-10 30 MG capsule Take 30 mg by mouth daily.     FIBER PO Take 3 tablets by mouth daily. metamucil     gabapentin  (NEURONTIN ) 600 MG tablet Take 1 tablet (600 mg total) by mouth 3 (three) times daily. 270 tablet 1   ketorolac  (TORADOL ) 10 MG tablet Take 1 tablet (10 mg total) by mouth every 6 (six) hours as needed. 20 tablet 0   Multiple Vitamin (MULTIVITAMIN) capsule Take 1 capsule by mouth daily.     omega-3 acid ethyl esters (LOVAZA) 1 g capsule Take 1 g by mouth daily.     ondansetron  (ZOFRAN -ODT) 8 MG disintegrating tablet Take 1 tablet (8 mg total) by mouth every 8 (eight) hours as needed for nausea or vomiting. 20 tablet 0   oxyCODONE -acetaminophen  (PERCOCET/ROXICET) 5-325 MG tablet Take 1 tablet by mouth every 6 (six) hours as needed for severe pain (pain score 7-10). 15 tablet 0   moxifloxacin (VIGAMOX) 0.5 % ophthalmic solution Apply to eye. (Patient not taking: Reported on 01/09/2023)     tamsulosin  (FLOMAX ) 0.4 MG CAPS capsule Take 1 capsule (0.4 mg total) by mouth daily. 30 capsule 0   No facility-administered medications prior to visit.     Allergies:   Contrast media [iodinated contrast media], Ioxaglate, and Sulfa antibiotics   Social History   Socioeconomic History   Marital status: Single    Spouse name: Not on file   Number of children: 0   Years of education: Not on file   Highest education level: Not on file  Occupational History   Occupation: work holiday representative  Tobacco Use   Smoking status: Former    Current packs/day: 0.00    Types: Cigarettes    Quit date: 08/2018     Years since quitting: 4.4   Smokeless tobacco: Never   Tobacco comments:    Smoke on-off    1 ppd age 34 to 49 =16 pack-year    1 ppd [age] = 6 pack-year     Total: 22 pack-year    Quit 2020  Vaping Use   Vaping status: Never Used  Substance and Sexual Activity   Alcohol use: Not Currently    Comment: None Since 2017 MB RN   Drug use: No   Sexual activity: Not on file  Other Topics Concern   Not on file  Social History Narrative   From [place], used to live in [place] x a while, get back to [facility] 2009   Lives by himself    Right handed    Caffeine- 32 oz per day    Social Drivers of Corporate Investment Banker Strain: Not on file  Food Insecurity: Not on file  Transportation Needs: Not on file  Physical Activity: Not on file  Stress: Not on file  Social Connections: Unknown (05/27/2021)   Received from [facility]   Social Network    Social Network: Not on file    Social history is notable in that he is single and never married.  He does not have children.  After not being employed for the past year, he commenced working again on May 04, 2019.  Prior tobacco use.  Family History:  The patient's family history includes Cancer in his mother; Colon cancer in his maternal grandmother and paternal uncle; Colon cancer (age of onset: 26) in his brother; Coronary artery disease in his father; Stroke in his mother.  His mother died at age 40 with a brain tumor.  Father is living at age 40.  He has had history of an MI.  He has a brother age 19 and a sister age 3.  ROS General: Negative; No fevers, chills, or night sweats;  HEENT: Negative; No changes in vision or hearing, sinus congestion, difficulty swallowing Pulmonary: Negative; No cough, wheezing, shortness of breath, hemoptysis Cardiovascular: Negative; No chest pain, presyncope, syncope, palpitations Mild leg swelling GI: History of diverticulitis and diverticulosis; IBS with diarrhea; history of GERD with  erosive esophagitis in 2006; history of gallbladder polyps GU: Positive for kidney stones Musculoskeletal: Tar fasciitis, intermittent sharp knifelike sensation in left shoulder blade Hematologic/Oncology: Negative; no easy bruising, bleeding Endocrine: Negative; no heat/cold intolerance; no diabetes Neuro: Negative; no changes in balance, headaches Skin: Negative; No rashes or skin lesions Psychiatric: Negative; No behavioral problems, depression Sleep: See HPI;  other comprehensive 14 point system review is negative.   PHYSICAL EXAM:   VS:  BP 134/84   Pulse 93   Ht 6' (1.829 m)   Wt 299 lb 6.4 oz (135.8 kg)   SpO2 98%   BMI 40.61 kg/m     Repeat blood pressure by me was 126/80  Wt Readings from Last 3 Encounters:  01/24/23 299 lb 6.4 oz (135.8 kg)  01/09/23 300 lb (136.1 kg)  12/28/22 (!) 303 lb 4 oz (137.6 kg)   General: Alert, oriented, no distress.  Skin: normal turgor, no rashes, warm and dry HEENT: Normocephalic, atraumatic. Pupils equal round and reactive to light; sclera anicteric; extraocular muscles intact;  Nose without nasal septal hypertrophy Mouth/Parynx: Small oral aperture; Mallinpatti scale 4 Neck: No JVD, no carotid bruits; normal carotid upstroke Lungs: clear to ausculatation and percussion; no wheezing or rales Chest wall: without tenderness to palpitation Heart: PMI not displaced, RRR, s1 s2 normal, 1/6 systolic murmur, no diastolic murmur, no rubs, gallops, thrills, or heaves Abdomen: Mild central adiposity; soft, nontender; no hepatosplenomehaly, BS+; abdominal aorta nontender and not dilated by palpation. Back: no CVA tenderness Pulses 2+ Musculoskeletal: full range of motion, normal strength, no joint deformities Extremities: no clubbing cyanosis or edema, Homan's sign negative  Neurologic: grossly nonfocal; Cranial nerves grossly wnl Psychologic: Normal mood and affect     Studies/Labs Reviewed:   EKG Interpretation Date/Time:  Thursday  January 24 2023 09:01:36 EST Ventricular Rate:  93 PR Interval:  144 QRS Duration:  98 QT Interval:  362 QTC Calculation: 450 R Axis:   44  Text Interpretation: Normal sinus rhythm Normal ECG When compared with ECG of 30-Jun-2021 16:58, No significant change was found Confirmed by Burnard Ned ([id number]) on 01/24/2023 9:51:26 AM    May 16, 2020 ECG (independently read by me): Sinus rhtyhm at 63; . Mild sinus arrythmia  May 2021 ECG (independently read by me): NSR at 89; NO significant STT changes  Recent Labs:    Latest Ref Rng & Units 01/09/2023   10:38 AM 12/28/2022    3:03 PM 09/11/2021   10:28 AM  BMP  Glucose 70 - 99 mg/dL 846  86  96   BUN 6 - 20 mg/dL 19  19  16    Creatinine 0.61 - 1.24 mg/dL 8.69  8.70  8.96   BUN/Creat Ratio 6 - 22 (calc)  SEE NOTE:    Sodium 135 - 145 mmol/L 140  139  137   Potassium 3.5 - 5.1 mmol/L 3.9  4.3  4.8   Chloride 98 - 111 mmol/L 104  103  104   CO2 20 - 32 mmol/L  29  27   Calcium 8.6 - 10.3 mg/dL  9.6  9.0         Latest Ref Rng & Units 12/28/2022    3:03 PM 09/11/2021   10:28 AM 07/12/2021    4:19 PM  Hepatic Function  Total Protein 6.1 - 8.1 g/dL 7.3  7.6  7.5   Albumin 3.5 - 5.2 g/dL  4.1  4.2   AST 10 - 35 U/L 25  21  23    ALT 9 - 46 U/L 17  18  15    Alk Phosphatase 39 - 117 U/L  78  75   Total Bilirubin 0.2 - 1.2 mg/dL 0.6  0.5  0.4        Latest Ref Rng & Units 01/09/2023   10:38 AM 01/09/2023    9:30 AM 12/28/2022    3:03 PM  CBC  WBC 4.0 - 10.5 K/uL  16.9  11.8   Hemoglobin 13.0 - 17.0 g/dL 84.9  85.4  85.8   Hematocrit 39.0 - 52.0 % 44.0  43.4  42.7   Platelets 150 - 400 K/uL  191  189    Lab Results  Component Value Date   MCV 89.3 01/09/2023   MCV 87.0 12/28/2022   MCV 87.8 09/11/2021   Lab Results  Component Value Date   TSH 1.81 12/28/2022   Lab Results  Component Value Date   HGBA1C 5.6 12/28/2022     BNP No results found for: BNP  ProBNP No results found for: PROBNP   Lipid Panel      Component Value Date/Time   CHOL 134 12/28/2022 1503   TRIG 118 12/28/2022 1503   HDL 35 (L) 12/28/2022 1503   CHOLHDL 3.8 12/28/2022 1503   VLDL 24.2 10/23/2021 1018   LDLCALC 79 12/28/2022 1503   LDLDIRECT 81.2 09/21/2010 0813     RADIOLOGY: CT Renal Stone Study Result Date: 01/09/2023 CLINICAL DATA:  Abdominal/flank pain. Stone suspected. Right-sided flank pain. Personal history of kidney stones. EXAM: CT ABDOMEN AND PELVIS WITHOUT CONTRAST TECHNIQUE: Multidetector CT imaging of the abdomen and pelvis was performed following the standard protocol without IV contrast. RADIATION DOSE REDUCTION: This exam was performed according to the departmental dose-optimization program which includes automated exposure control, adjustment of the mA and/or kV according to patient size and/or use of iterative reconstruction technique. COMPARISON:  CT virtual colonoscopy 07/31/2021 FINDINGS: Lower chest: Mild dependent atelectasis present bilaterally. The heart size is normal. No significant pleural or pericardial effusion is present. Hepatobiliary: No focal liver abnormality is seen. No gallstones, gallbladder wall thickening, or biliary dilatation. Pancreas: Unremarkable. No pancreatic ductal dilatation or surrounding inflammatory changes. Spleen: Normal in size without focal abnormality. Adrenals/Urinary Tract: Adrenal glands are normal bilaterally. A 10 mm obstructing stone is present at the right UPJ. Moderate right-sided hydronephrosis is present. Additional nonobstructing 5 mm stone is present in the midportion of the right kidney. Three nonobstructing stones are present in the left kidney. The largest is at the lower pole measuring 11 mm. Parenchymal lesions are present. Ureters are otherwise within normal limits bilaterally. The urinary bladder is normal. Stomach/Bowel: The stomach and duodenum are within normal limits. Small bowel is unremarkable. Terminal ileum is normal. The appendix is visualized and  normal. The ascending and transverse colon are normal. Diverticular changes are present in the distal descending and sigmoid colon. Inflammatory changes are present about the distal sigmoid colon. No free fluid or free air is present. The rectum is within normal limits. Vascular/Lymphatic:  No significant vascular findings are present. No enlarged abdominal or pelvic lymph nodes. Reproductive: Prostate is unremarkable. Other: No abdominal wall hernia or abnormality. No abdominopelvic ascites. Musculoskeletal: Mild degenerative changes are present in the lower lumbar spine. A left L5 pars defect is present. Vertebral body heights and alignment are otherwise normal. The bony pelvis is within normal limits. The hips are located and normal bilaterally. IMPRESSION: 1. 10 mm obstructing stone at the right UPJ with moderate right-sided hydronephrosis. 2. Additional nonobstructing stones in both kidneys. 3. Acute uncomplicated diverticulitis of the distal sigmoid colon. 4. Left L5 pars defect. Electronically Signed   By: Lonni Necessary M.D.   On: 01/09/2023 08:14     Additional studies/ records that were reviewed today include:  I reviewed the records of Dr. Elnoria Mech.  I also reviewed GI records of Dr. Charlanne.  New download was obtained from December 24, 2022 through January 22, 2023.  ASSESSMENT:    1. OSA (obstructive sleep apnea)   2. Essential hypertension   3. Left shoulder pain, unspecified chronicity   4. Morbid obesity (HCC)     PLAN:  Mario Jimenez is a 58 year-old moderately obese male who has a history of prior tobacco use for approximately 24 years and fortunately quit.  When I initially saw him, he had noticed progressively more abnormal sleep pattern with over the past month he has noticed progressively more abnormal sleep pattern with some insomnia, snoring, awakening gasping for breath, nocturia 2-3 times per night, the need to take frequent daytime naps with excessive daytime  sleepiness.   During my patient evaluation his Epworth sleepiness scale score endorsed at 14 consistent with daytime sleepiness.  During my initial evaluation I had a long discussion with him and felt his symptoms were highly suspect for sleep apnea.  I reviewed with him cardiovascular ramifications if sleep apnea is left untreated specifically with reference to blood pressure control, nocturnal arrhythmias, potential increased incidence of atrial fibrillation with increased incidence of recurrent AF following successful cardioversion and I also discussed the effects of nocturnal hypoxemia on potential coronary as well as cerebrovascular ischemia.   I discussed its effects on GERD, glucose metabolism as well as inflammation. His sleep study revealed severe sleep apnea with an AHI of 47.6.  He was unable to achieve REM sleep on the diagnostic portion of the study.  He has been on CPAP therapy since September 28, 2019.  Presently, he continues to be compliant with CPAP therapy as it stays at 100% and average use at 9 hours and 27 minutes.  His 95th percentile pressure range at 11.5 with maximum average pressure 13.2, I have suggested slight alterations in his current prescription.  I will change his ramp start time from 45 minutes down to 20 minutes and increase his ramp start pressure from 4 up to 6 cm of water.  Also will change his AutoSet pressure range from 5-15 to 8-16.  He has been using nasal pillows.  We will need to change his DME from [facility] who is no longer in the business to a company based on his insurance.  Often times he buys supplies online.  I have recommended a ResMed N30i mask which I believe he will do well.  His blood pressure today is stable on amlodipine  5 mg.  He takes Lovaza for triglyceride elevation.  He is followed by Dr. Mech for primary care.  Most recent laboratory from December 28, 2022 was reviewed which showed total cholesterol  134, HDL 35, LDL 79, and triglycerides 881.   Hemoglobin A1c was 5.6.  I discussed the importance of weight loss particularly with his morbid obesity and BMI of 40.61.  I will transition him to the Cardiologic care of Dr. Kate.    His download obtained in the office today from April 13, 2020 through May 12, 2020 confirms excellent compliance.  AHI is excellent at 1.3 with his 95th percentile pressure at 11.2 with maximum average pressure 12.5.  As result I will continue him on his current settings with a minimum pressure of 8 and maximum pressure of 16.  He now has a ResMed AirFit F 30 medium size mask which is significantly better than his previous mask.  He notes improved energy.  His blood pressure today is controlled.  An Epworth Sleepiness Scale score was recalculated in the office today which endorsed at 10, improved from previously.  I discussed the importance of weight loss and increased exercise.  He will be seeing his primary physician, Dr.Paz later today.  I will see him in 1 year for reevaluation.    Medication Adjustments/Labs and Tests Ordered: Current medicines are reviewed at length with the patient today.  Concerns regarding medicines are outlined above.  Medication changes, Labs and Tests ordered today are listed in the Patient Instructions below. Patient Instructions  Medication Instructions:  *If you need a refill on your cardiac medications before your next appointment, please call your pharmacy*   Lab Work: If you have labs (blood work) drawn today and your tests are completely normal, you will receive your results only by: MyChart Message (if you have MyChart) OR A paper copy in the mail If you have any lab test that is abnormal or we need to change your treatment, we will call you to review the results.  Follow-Up: At [facility], you and your health needs are our priority.  As part of our continuing mission to provide you with exceptional heart care, we have created designated Provider Care Teams.   These Care Teams include your primary Cardiologist (physician) and Advanced Practice Providers (APPs -  Physician Assistants and Nurse Practitioners) who all work together to provide you with the care you need, when you need it.  We recommend signing up for the patient portal called MyChart.  Sign up information is provided on this After Visit Summary.  MyChart is used to connect with patients for Virtual Visits (Telemedicine).  Patients are able to view lab/test results, encounter notes, upcoming appointments, etc.  Non-urgent messages can be sent to your provider as well.   To learn more about what you can do with MyChart, go to [email or web address].    Your next appointment:   1 year(s)  Provider:   Lonni Kate, MD     Other Instructions If you have any questions or concerns regarding your c-pap, bi-pap or sleep accessories, please contact Brandie Rorie at [phone].   Thank you for choosing [facility]!         Signed, Debby Sor, MD, [facility], ABSM Diplomate, American Board of Sleep Medicine  01/24/2023 5:13 PM    [facility] [address], [address], [place], [place]  [address] Phone: [phone]

## 2023-01-24 NOTE — Patient Instructions (Signed)
 Medication Instructions:  *If you need a refill on your cardiac medications before your next appointment, please call your pharmacy*   Lab Work: If you have labs (blood work) drawn today and your tests are completely normal, you will receive your results only by: MyChart Message (if you have MyChart) OR A paper copy in the mail If you have any lab test that is abnormal or we need to change your treatment, we will call you to review the results.  Follow-Up: At [facility], you and your health needs are our priority.  As part of our continuing mission to provide you with exceptional heart care, we have created designated Provider Care Teams.  These Care Teams include your primary Cardiologist (physician) and Advanced Practice Providers (APPs -  Physician Assistants and Nurse Practitioners) who all work together to provide you with the care you need, when you need it.  We recommend signing up for the patient portal called MyChart.  Sign up information is provided on this After Visit Summary.  MyChart is used to connect with patients for Virtual Visits (Telemedicine).  Patients are able to view lab/test results, encounter notes, upcoming appointments, etc.  Non-urgent messages can be sent to your provider as well.   To learn more about what you can do with MyChart, go to [email or web address].    Your next appointment:   1 year(s)  Provider:   Lonni Nanas, MD     Other Instructions If you have any questions or concerns regarding your c-pap, bi-pap or sleep accessories, please contact Brandie Rorie at [phone].   Thank you for choosing [facility]!

## 2023-01-25 ENCOUNTER — Encounter: Payer: Self-pay | Admitting: Podiatry

## 2023-01-25 ENCOUNTER — Ambulatory Visit (INDEPENDENT_AMBULATORY_CARE_PROVIDER_SITE_OTHER): Payer: BC Managed Care – PPO | Admitting: Podiatry

## 2023-01-25 ENCOUNTER — Ambulatory Visit (INDEPENDENT_AMBULATORY_CARE_PROVIDER_SITE_OTHER): Payer: BC Managed Care – PPO

## 2023-01-25 DIAGNOSIS — M778 Other enthesopathies, not elsewhere classified: Secondary | ICD-10-CM

## 2023-01-25 DIAGNOSIS — M722 Plantar fascial fibromatosis: Secondary | ICD-10-CM

## 2023-01-25 DIAGNOSIS — Q666 Other congenital valgus deformities of feet: Secondary | ICD-10-CM | POA: Diagnosis not present

## 2023-01-25 MED ORDER — CICLOPIROX 8 % EX SOLN: Freq: Every day | CUTANEOUS | 2 refills | Status: DC

## 2023-01-25 MED ORDER — MELOXICAM 15 MG PO TABS: 15.0000 mg | ORAL_TABLET | Freq: Every day | ORAL | 0 refills | Status: AC | PRN

## 2023-01-25 NOTE — Progress Notes (Signed)
 Subjective:   Patient ID: Mario Jimenez, male   DOB: 58 y.o.   MRN: [id number]   HPI Chief Complaint  Patient presents with   Foot Pain    Rm#13 Bilateral foot pain X 3 months patient states the pain is bad.   58 year old male presents the office with above concerns.  He recently had a fibroma of the arch of the right foot.  He gets warts, on the right side and the left side.  He had orthotics made many years ago and since has been using power steps.  He does have neuropathy and is on gabapentin .  This does not correlate recent injuries to his feet.   Review of Systems  All other systems reviewed and are negative.  Past Medical History:  Diagnosis Date   Allergy    Asthma mild   no inhaler   Gallbladder polyp 2018   6mm polyp   GERD (gastroesophageal reflux disease) occasional   watches diet and takes diet   Hemorrhoids    h/o Cscope    History of MRSA infection few yrs ago while in hospital in [place]    Meniscus tear 08/2010   to have surgery-- left knee   OSA on CPAP    Sleep apnea    Urolithiasis 2007-2008   ([place] ) left kidney  3 surgeries ; has passed stones since then     Past Surgical History:  Procedure Laterality Date   CYSTOSCOPY/RETROGRADE/URETEROSCOPY/STONE EXTRACTION WITH BASKET  2007-- x2   KNEE ARTHROSCOPY  12/05/2010   Procedure: ARTHROSCOPY KNEE;  Surgeon: Lynwood P Aplington;  Location: [facility];  Service: Orthopedics;  Laterality: Left;  LEFT KNEE ARTHROSCOPY WITH PARTIAL MEDIAL  MENISECTOMY, Total lateral menisectomy, Shaving of Medial Femoral condyle   leg surgery  1977- age 62   Left , s/p traction, body cast     Current Outpatient Medications:    acetaminophen  (TYLENOL ) 325 MG tablet, Take 650 mg by mouth every 6 (six) hours as needed for moderate pain (pain score 4-6)., Disp: , Rfl:    amLODipine  (NORVASC ) 5 MG tablet, Take 1 tablet (5 mg total) by mouth daily., Disp: 90 tablet, Rfl: 0   amoxicillin -clavulanate (AUGMENTIN ) 875-125  MG tablet, Take 1 tablet by mouth every 12 (twelve) hours., Disp: 14 tablet, Rfl: 0   aspirin EC 81 MG tablet, Take 81 mg by mouth daily. Swallow whole., Disp: , Rfl:    co-enzyme Q-10 30 MG capsule, Take 30 mg by mouth daily., Disp: , Rfl:    FIBER PO, Take 3 tablets by mouth daily. metamucil, Disp: , Rfl:    gabapentin  (NEURONTIN ) 600 MG tablet, Take 1 tablet (600 mg total) by mouth 3 (three) times daily., Disp: 270 tablet, Rfl: 1   ketorolac  (TORADOL ) 10 MG tablet, Take 1 tablet (10 mg total) by mouth every 6 (six) hours as needed., Disp: 20 tablet, Rfl: 0   Multiple Vitamin (MULTIVITAMIN) capsule, Take 1 capsule by mouth daily., Disp: , Rfl:    omega-3 acid ethyl esters (LOVAZA) 1 g capsule, Take 1 g by mouth daily., Disp: , Rfl:    ondansetron  (ZOFRAN -ODT) 8 MG disintegrating tablet, Take 1 tablet (8 mg total) by mouth every 8 (eight) hours as needed for nausea or vomiting., Disp: 20 tablet, Rfl: 0   oxyCODONE -acetaminophen  (PERCOCET/ROXICET) 5-325 MG tablet, Take 1 tablet by mouth every 6 (six) hours as needed for severe pain (pain score 7-10)., Disp: 15 tablet, Rfl: 0   tamsulosin  (FLOMAX ) 0.4 MG CAPS capsule,  Take 1 capsule (0.4 mg total) by mouth daily., Disp: 90 capsule, Rfl: 3  Allergies  Allergen Reactions   Contrast Media [Iodinated Contrast Media] Hives   Ioxaglate Hives    Contrast   Sulfa Antibiotics Hives           Objective:  Physical Exam  General: AAO x3, NAD  Dermatological: Skin is warm, dry and supple bilateral. There are no open sores, no preulcerative lesions, no rash or signs of infection present.  Vascular: Dorsalis Pedis artery and Posterior Tibial artery pedal pulses are 2/4 bilateral with immedate capillary fill time.  There is no pain with calf compression, swelling, warmth, erythema.   Neruologic: Grossly intact via light touch bilateral.   Musculoskeletal: Decreased medial arch upon weightbearing bilaterally.  He does get tenderness palpation of the  plantar aspect calcaneus on insertion of the plantar fascia but also along the arch of the foot.  He gets tenderness on the medial aspect of the foot where there is a bony prominence along the first metatarsocuneiform joint area.  Unable to palpate a fibroma today.  There is no area pinpoint tenderness.  There is no edema, erythema.  MMT 5/5.  Gait: Unassisted, Nonantalgic.       Assessment:   58 year old male with pes planovalgus, plantar fasciitis     Plan:  -Treatment options discussed including all alternatives, risks, and complications -Etiology of symptoms were discussed -X-rays were obtained and reviewed with the patient.  3 views of each foot were obtained.  No evidence of acute fracture.  Hardware is present. -Prescribed mobic . Discussed side effects of the medication and directed to stop if any are to occur and call the office.  -We discussed resting, icing a regular basis. -Discussed shoes, good arch support.  I do think a benefit from new custom orthotics which she was measured for today.    Return for orthotic pick up.  Donnice JONELLE Fees DPM

## 2023-01-25 NOTE — Patient Instructions (Addendum)

## 2023-03-07 ENCOUNTER — Telehealth: Payer: Self-pay

## 2023-03-07 NOTE — Telephone Encounter (Signed)
 Left vm to schedule orthotic pick up

## 2023-03-08 ENCOUNTER — Telehealth: Payer: Self-pay | Admitting: Podiatry

## 2023-03-08 NOTE — Telephone Encounter (Signed)
Returning [place] call from [employer]

## 2023-03-11 ENCOUNTER — Telehealth: Payer: Self-pay

## 2023-03-11 NOTE — Telephone Encounter (Signed)
 Left pt voicemail to schedule orthotic pick up.

## 2023-03-15 DIAGNOSIS — N202 Calculus of kidney with calculus of ureter: Secondary | ICD-10-CM | POA: Diagnosis not present

## 2023-03-15 DIAGNOSIS — R8271 Bacteriuria: Secondary | ICD-10-CM | POA: Diagnosis not present

## 2023-03-18 ENCOUNTER — Other Ambulatory Visit: Payer: Self-pay | Admitting: Urology

## 2023-03-19 NOTE — Progress Notes (Signed)
 Left voicemail for patient informing him that we would try to call back. Informed him via voicemail to arrive at 0800 at [facility] main entrance for short stay center, take laxative of choice Thursday evening, eat light meal that evening, hydrate well, to stop aspirin, mobic, toradol and multivitamin.

## 2023-03-20 ENCOUNTER — Encounter (HOSPITAL_COMMUNITY): Payer: Self-pay | Admitting: Urology

## 2023-03-20 NOTE — Progress Notes (Signed)
 Spoke w/ via phone for pre-op interview---[facility] needs dos---- KUB              COVID test -----patient states asymptomatic no test needed Arrive at -------0800 NPO after MN NO Solid Food.  Clear liquids from MN until--- Med rec completed. Pt aware to hold ASA/NSAIDs and supplements per [facility] protocol.  Medications to take morning of surgery -----Amlodipine, zofran, percocet as needed by 4 AM Diabetic/Weight loss medication ----- No Alcohol or recreational drugs for 24 hours/Tobacco products for 6 hours ---- Patient instructed to bring blue lithotripsy folder, photo id and insurance card day of surgery. Patient aware to have Driver (ride ) / caregiver for 24 hours after surgery ----- Patient Special Instructions -----no NSAIDs, aspirin or aspirin containing products, vitamins, supplements from today until after the procedure. Pre-Op special Instructions ----- take laxative of choice day before procedure Patient verbalized understanding of instructions that were given at this phone interview. Patient denies shortness of breath, chest pain, fever, cough at this phone interview.

## 2023-03-21 NOTE — H&P (Signed)
 03/15/2023: Today is a new patient to clinic. Previously followed by Dr. Pete Glatter for past history of nephrolithiasis. Patient was consulted on by Dr. Marlou Porch on 01/09/2023 in the emergency department when patient presented with acute right-sided pain and discomfort. CT imaging at that time showed an obstructing 10 mm right UPJ calculus, a mid pole right 5mm stone and an 11mm in left lower pole. This is not his first stone event, he has required shockwave lithotripsy in the past. Skin to stone distance anteriorly is 18.5 centimeters and laterally 16.2 cm. It is easily seen on scout imaging. We had originally try to get him scheduled for shockwave lithotripsy per Dr. Jasmine Awe recommendation in early January but were unable to reach him to schedule.   Other past medical history significant for GERD, OSA on CPAP, obesity, hypertension, neuropathy, diverticulitis, frequent falls and plantar fasciitis. His PCP is Dr. Porfirio Oar.   For the past week he has been having increased flank and lower abdominal pain/discomfort. He has had a couple of episodes of nausea with 1 episode of vomiting earlier in the week. He has taken Zofran ODT with good results. He is taking tamsulosin twice daily as well for management of underlying LUTS. He has stable frequency and nocturia. He has had some increased urgency and intermittency of stream. He denies any dysuria or gross hematuria. Current symptoms not associated with fevers or chills but he does endorse constipation. He has been taking over-the-counter stool softeners. Currently without pain.     ALLERGIES: Iodinated Contrast Media - Oral and IV Dye - Hives Loxaglate - Hives Sulfa - Hives    MEDICATIONS: Ketorolac Tromethamine  Tamsulosin Hcl 0.4 mg capsule  Acetaminophen 325 mg tablet  Adult Low Dose Aspirin Ec 81 mg tablet, delayed release  Amlodipine Besylate 5 mg tablet  Augmentin 875-125 Mg  Ciclopirox 8 % solution, non-oral  Co-Enzyme Q10 100 mg-20 mg-15 mg  capsule  Fiber  Gabapentin 600 mg tablet  Meloxicam 15 mg tablet  Multivitamin  Omega 3  Ondansetron Hcl 8 mg tablet     GU PSH: No GU PSH      PSH Notes: Arthroscopy left knee (11/2010), Colonoscopy (05/2021), NCV with EMG (electromyography) 10/2020, Hx left kidney surgery in [place]   NON-GU PSH: No Non-GU PSH    GU PMH: None     PMH Notes: Neuropathy, Diverticulitis   NON-GU PMH: Arthritis Asthma Diverticulosis GERD Hypertension Obesity Osteoarthritis Sleep Apnea    FAMILY HISTORY: No Family History    SOCIAL HISTORY: Marital Status: Single Preferred Language: English; Ethnicity: Not Hispanic Or Latino; Race: White Current Smoking Status: Patient does not smoke anymore. Has not smoked since 02/15/2018. Smoked less than 1/2 pack per day.   Tobacco Use Assessment Completed: Used Tobacco in last 30 days? Does not drink anymore.  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.    REVIEW OF SYSTEMS:    GU Review Male:   Patient reports frequent urination, get up at night to urinate, and stream starts and stops. Patient denies hard to postpone urination, burning/ pain with urination, leakage of urine, trouble starting your stream, have to strain to urinate , erection problems, and penile pain.  Gastrointestinal (Upper):   Patient reports nausea and vomiting. Patient denies indigestion/ heartburn.  Gastrointestinal (Lower):   Patient reports constipation. Patient denies diarrhea.  Constitutional:   Patient denies fever, night sweats, weight loss, and fatigue.  Skin:   Patient denies skin rash/ lesion and itching.  Eyes:   Patient  reports blurred vision. Patient denies double vision.  Ears/ Nose/ Throat:   Patient reports sinus problems. Patient denies sore throat.  Hematologic/Lymphatic:   Patient reports swollen glands. Patient denies easy bruising.  Cardiovascular:   Patient reports leg swelling. Patient denies chest pains.  Respiratory:   Patient reports  shortness of breath. Patient denies cough.  Endocrine:   Patient denies excessive thirst.  Musculoskeletal:   Patient reports back pain and joint pain.   Neurological:   Patient reports headaches. Patient denies dizziness.  Psychologic:   Patient denies anxiety and depression.   VITAL SIGNS:      03/15/2023 01:02 PM  Weight 295 lb / 133.81 kg  Height 73 in / 185.42 cm  BP 132/85 mmHg  Pulse 90 /min  BMI 38.9 kg/m   MULTI-SYSTEM PHYSICAL EXAMINATION:    Constitutional: Well-nourished. No physical deformities. Normally developed. Good grooming.  Neck: Neck symmetrical, not swollen. Normal tracheal position.  Respiratory: No labored breathing, no use of accessory muscles.   Cardiovascular: Normal temperature, normal extremity pulses, no swelling, no varicosities.  Skin: No paleness, no jaundice, no cyanosis. No lesion, no ulcer, no rash.  Neurologic / Psychiatric: Oriented to time, oriented to place, oriented to person. No depression, no anxiety, no agitation.  Gastrointestinal: Obese abdomen. No mass, no tenderness, no rigidity. No CVA or flank tenderness at present time  Musculoskeletal: Normal gait and station of head and neck.     Complexity of Data:  Source Of History:  Patient, Medical Record Summary  Lab Test Review:   BMP, CBC with Diff  Records Review:   Previous Doctor Records, Previous Hospital Records, Previous Patient Records  Urine Test Review:   Urinalysis  X-Ray Review: C.T. Abdomen/Pelvis: Reviewed Films. Reviewed Report.     03/15/23  Urinalysis  Urine Appearance Clear   Urine Color Yellow   Urine Glucose Neg mg/dL  Urine Bilirubin Neg mg/dL  Urine Ketones Trace mg/dL  Urine Specific Gravity 1.020   Urine Blood 2+ ery/uL  Urine pH <=5.0   Urine Protein Neg mg/dL  Urine Urobilinogen 0.2 mg/dL  Urine Nitrites Neg   Urine Leukocyte Esterase Neg leu/uL  Urine WBC/hpf NS (Not Seen)   Urine RBC/hpf 3 - 10/hpf   Urine Epithelial Cells NS (Not Seen)   Urine  Bacteria Few (10-25/hpf)   Urine Mucous Not Present   Urine Yeast NS (Not Seen)   Urine Trichomonas Not Present   Urine Cystals NS (Not Seen)   Urine Casts NS (Not Seen)   Urine Sperm Not Present   Notes:                     CLINICAL DATA: Abdominal/flank pain. Stone suspected. Right-sided  flank pain. Personal history of kidney stones.   EXAM:  CT ABDOMEN AND PELVIS WITHOUT CONTRAST   TECHNIQUE:  Multidetector CT imaging of the abdomen and pelvis was performed  following the standard protocol without IV contrast.   RADIATION DOSE REDUCTION: This exam was performed according to the  departmental dose-optimization program which includes automated  exposure control, adjustment of the mA and/or kV according to  patient size and/or use of iterative reconstruction technique.   COMPARISON: CT virtual colonoscopy 07/31/2021   FINDINGS:  Lower chest: Mild dependent atelectasis present bilaterally. The  heart size is normal. No significant pleural or pericardial effusion  is present.   Hepatobiliary: No focal liver abnormality is seen. No gallstones,  gallbladder wall thickening, or biliary dilatation.   Pancreas: Unremarkable.  No pancreatic ductal dilatation or  surrounding inflammatory changes.   Spleen: Normal in size without focal abnormality.   Adrenals/Urinary Tract: Adrenal glands are normal bilaterally. A 10  mm obstructing stone is present at the right UPJ. Moderate  right-sided hydronephrosis is present. Additional nonobstructing 5  mm stone is present in the midportion of the right kidney. Three  nonobstructing stones are present in the left kidney. The largest is  at the lower pole measuring 11 mm. Parenchymal lesions are present.  Ureters are otherwise within normal limits bilaterally. The urinary  bladder is normal.   Stomach/Bowel: The stomach and duodenum are within normal limits.  Small bowel is unremarkable. Terminal ileum is normal. The appendix  is  visualized and normal. The ascending and transverse colon are  normal. Diverticular changes are present in the distal descending  and sigmoid colon. Inflammatory changes are present about the distal  sigmoid colon. No free fluid or free air is present. The rectum is  within normal limits.   Vascular/Lymphatic: No significant vascular findings are present. No  enlarged abdominal or pelvic lymph nodes.   Reproductive: Prostate is unremarkable.   Other: No abdominal wall hernia or abnormality. No abdominopelvic  ascites.   Musculoskeletal: Mild degenerative changes are present in the lower  lumbar spine. A left L5 pars defect is present. Vertebral body  heights and alignment are otherwise normal. The bony pelvis is  within normal limits. The hips are located and normal bilaterally.   IMPRESSION:  1. 10 mm obstructing stone at the right UPJ with moderate  right-sided hydronephrosis.  2. Additional nonobstructing stones in both kidneys.  3. Acute uncomplicated diverticulitis of the distal sigmoid colon.  4. Left L5 pars defect.    Electronically Signed  By: Marin Roberts M.D.  On: 01/09/2023 08:14     PROCEDURES:         KUB - [id number]  A single view of the abdomen is obtained. An approximately 9 x 11 mm opacity consistent with the previously identified right proximal ureteral calculi is seen between the transverse process of L2 and L3. Other additional nonobstructing calculi seen within the obscured right renal shadow. He also continues with a large nonobstructing left lower pole stone. A left ureteral calculi was not seen. Overlying bowel gas and stool pattern obscures direct visualization of pelvic inlet but bladder grossly appears free of obstruction.      . Patient confirmed No Neulasta OnPro Device.           Urinalysis w/Scope Dipstick Dipstick Cont'd Micro  Color: Yellow Bilirubin: Neg mg/dL WBC/hpf: NS (Not Seen)  Appearance: Clear Ketones: Trace mg/dL RBC/hpf: 3 -  40/JWJ  Specific Gravity: 1.020 Blood: 2+ ery/uL Bacteria: Few (10-25/hpf)  pH: <=5.0 Protein: Neg mg/dL Cystals: NS (Not Seen)  Glucose: Neg mg/dL Urobilinogen: 0.2 mg/dL Casts: NS (Not Seen)    Nitrites: Neg Trichomonas: Not Present    Leukocyte Esterase: Neg leu/uL Mucous: Not Present      Epithelial Cells: NS (Not Seen)      Yeast: NS (Not Seen)      Sperm: Not Present    ASSESSMENT:      ICD-10 Details  1 GU:   Renal and ureteral calculus - N20.2 Acute, Threat to Bodily Function   PLAN:            Medications New Meds: Oxycodone-Acetaminophen 5 mg-325 mg tablet 1-2 tablet PO Q 6 H PRN   #20  0 Refill(s)  Pharmacy Name:  [id number]  Address:  [address] [place], [place] [id number]  Phone:  [phone]  Fax:  [phone]            Orders Labs Urine Culture  X-Rays: KUB          Schedule Return Visit/Planned Activity: Next Available Appointment - Schedule Surgery, Follow up MD          Document Letter(s):  Created for Patient: Clinical Summary         Notes:   He continues with a right proximal ureteral calculi easily seen on today's KUB study. He has been more symptomatic over the past week in relation to this. Dr. Marlou Porch and previously recommended shockwave lithotripsy, the patient has done well with that in the past. He would like to pursue that as initial treatment for this stone. At some point in the future he would also like to undergo treatment for large left lower pole stone as well.   For shockwave lithotripsy I described the risks which include arrhythmia, kidney contusion, kidney hemorrhage, need for transfusion, long-term risk of diabetes or hypertension, back discomfort, flank ecchymosis, flank abrasion, inability to break up stone, inability to pass stone fragments, Steinstrasse, infection associated with obstructing stones, need for different surgical procedure and possible need for repeat shockwave lithotripsy.   He may  continue tamsulosin as previously prescribed. He has Zofran on hand as well to use as needed. I did recommend trying to stay well-hydrated and nourished. Over-the-counter stool softeners recommended to limit constipation. I will send in a refill of his pain medication. He may take over-the-counter Tylenol as well. Scheduling sheet filled out today and submitted. He will be contacted to schedule next available shockwave lithotripsy.

## 2023-03-22 ENCOUNTER — Encounter (HOSPITAL_COMMUNITY): Payer: Self-pay | Admitting: Urology

## 2023-03-22 ENCOUNTER — Encounter (HOSPITAL_COMMUNITY)
Admission: RE | Disposition: A | Discharge status: Home / Self Care | Payer: Self-pay | Source: Ambulatory Visit | Attending: Urology

## 2023-03-22 ENCOUNTER — Ambulatory Visit (HOSPITAL_COMMUNITY)
Admission: RE | Admit: 2023-03-22 | Discharge: 2023-03-22 | Disposition: A | Discharge status: Home / Self Care | Source: Ambulatory Visit | Attending: Urology | Admitting: Urology

## 2023-03-22 ENCOUNTER — Ambulatory Visit (HOSPITAL_COMMUNITY)

## 2023-03-22 ENCOUNTER — Other Ambulatory Visit: Payer: Self-pay

## 2023-03-22 DIAGNOSIS — Z87891 Personal history of nicotine dependence: Secondary | ICD-10-CM | POA: Insufficient documentation

## 2023-03-22 DIAGNOSIS — N201 Calculus of ureter: Secondary | ICD-10-CM | POA: Diagnosis not present

## 2023-03-22 DIAGNOSIS — N202 Calculus of kidney with calculus of ureter: Secondary | ICD-10-CM | POA: Diagnosis not present

## 2023-03-22 HISTORY — PX: EXTRACORPOREAL SHOCK WAVE LITHOTRIPSY: SHX1557

## 2023-03-22 SURGERY — LITHOTRIPSY, ESWL
Anesthesia: LOCAL | Laterality: Right

## 2023-03-22 MED ORDER — SODIUM CHLORIDE 0.9 % IV SOLN
INTRAVENOUS | Status: DC
Start: 2023-03-22 — End: 2023-03-22
  Administered 2023-03-22: 1000 mL via INTRAVENOUS

## 2023-03-22 MED ORDER — CIPROFLOXACIN HCL 500 MG PO TABS
500.0000 mg | ORAL_TABLET | ORAL | Status: AC
  Administered 2023-03-22: 500 mg via ORAL
  Filled 2023-03-22: qty 1

## 2023-03-22 MED ORDER — DIPHENHYDRAMINE HCL 25 MG PO CAPS
25.0000 mg | ORAL_CAPSULE | ORAL | Status: AC
  Administered 2023-03-22: 25 mg via ORAL
  Filled 2023-03-22: qty 1

## 2023-03-22 MED ORDER — DIAZEPAM 5 MG PO TABS
10.0000 mg | ORAL_TABLET | ORAL | Status: AC
  Administered 2023-03-22: 10 mg via ORAL
  Filled 2023-03-22: qty 2

## 2023-03-22 MED ORDER — SODIUM CHLORIDE 0.9% FLUSH: 3.0000 mL | Freq: Two times a day (BID) | INTRAVENOUS | Status: DC

## 2023-03-22 NOTE — Op Note (Signed)
 See [facility] scanned note.

## 2023-03-22 NOTE — Interval H&P Note (Signed)
 History and Physical Interval Note: No change in stone location.   03/22/2023 10:01 AM  Mario Jimenez  has presented today for surgery, with the diagnosis of RIGHT URETERAL STONE.  The various methods of treatment have been discussed with the patient and family. After consideration of risks, benefits and other options for treatment, the patient has consented to  Procedure(s): RIGHT EXTRACORPOREAL SHOCK WAVE LITHOTRIPSY (ESWL) (Right) as a surgical intervention.  The patient's history has been reviewed, patient examined, no change in status, stable for surgery.  I have reviewed the patient's chart and labs.  Questions were answered to the patient's satisfaction.     Bjorn Pippin

## 2023-03-25 ENCOUNTER — Encounter (HOSPITAL_COMMUNITY): Payer: Self-pay | Admitting: Urology

## 2023-03-29 ENCOUNTER — Ambulatory Visit: Payer: BC Managed Care – PPO | Admitting: Internal Medicine

## 2023-04-05 DIAGNOSIS — N202 Calculus of kidney with calculus of ureter: Secondary | ICD-10-CM | POA: Diagnosis not present

## 2023-04-12 ENCOUNTER — Ambulatory Visit: Payer: BC Managed Care – PPO

## 2023-04-12 ENCOUNTER — Ambulatory Visit (INDEPENDENT_AMBULATORY_CARE_PROVIDER_SITE_OTHER): Admitting: Internal Medicine

## 2023-04-12 VITALS — BP 126/76 | HR 86 | Temp 98.2°F | Resp 18 | Ht 71.75 in | Wt 310.1 lb

## 2023-04-12 DIAGNOSIS — I10 Essential (primary) hypertension: Secondary | ICD-10-CM

## 2023-04-12 DIAGNOSIS — G629 Polyneuropathy, unspecified: Secondary | ICD-10-CM | POA: Diagnosis not present

## 2023-04-12 NOTE — Progress Notes (Signed)
 Subjective:    Patient ID: Mario Jimenez, male    DOB: 12/17/1965, 58 y.o.   MRN: [id number]  DOS:  04/12/2023 Type of visit - description: Follow-up  Started amlodipine at last office visit. Ambulatory BPs range from 135/90 to 150/90.  Neuropathy: Started gabapentin, but seems to be helping.  History of urolithiasis, chart reviewed.  BP Readings from Last 3 Encounters:  04/12/23 126/76  03/22/23 137/83  01/24/23 134/84     Review of Systems See above   Past Medical History:  Diagnosis Date   Allergy    Asthma mild   no inhaler   Gallbladder polyp 2018   6mm polyp   GERD (gastroesophageal reflux disease) occasional   watches diet and takes diet   Hemorrhoids    h/o Cscope    History of MRSA infection few yrs ago while in hospital in [place]   Meniscus tear 08/2010   to have surgery-- left knee   OSA on CPAP    Sleep apnea    Urolithiasis 2007-2008   [place] left kidney  3 surgeries ; has passed stones since then     Past Surgical History:  Procedure Laterality Date   CYSTOSCOPY/RETROGRADE/URETEROSCOPY/STONE EXTRACTION WITH BASKET  2007-- x2   EXTRACORPOREAL SHOCK WAVE LITHOTRIPSY Right 03/22/2023   Procedure: RIGHT EXTRACORPOREAL SHOCK WAVE LITHOTRIPSY (ESWL);  Surgeon: Bjorn Pippin, MD;  Location: [facility];  Service: Urology;  Laterality: Right;   KNEE ARTHROSCOPY  12/05/2010   Procedure: ARTHROSCOPY KNEE;  Surgeon: Illene Labrador [place];  [facility];  Service: Orthopedics;  Laterality: Left;  LEFT KNEE ARTHROSCOPY WITH PARTIAL MEDIAL  MENISECTOMY, Total lateral menisectomy, Shaving of Medial Femoral condyle   leg surgery  1977- age 69   Left , s/p traction, body cast    Current Outpatient Medications  Medication Instructions   acetaminophen (TYLENOL) 650 mg, Every 6 hours PRN   amLODipine (NORVASC) 5 mg, Oral, Daily   aspirin EC 81 mg, Daily   co-enzyme Q-10 30 mg, Daily   famotidine (PEPCID) 10 mg, 2 times daily   FIBER PO 3 tablets,  Daily   gabapentin (NEURONTIN) 600 mg, Oral, 3 times daily   ketorolac (TORADOL) 10 mg, Oral, Every 6 hours PRN   meloxicam (MOBIC) 15 mg, Oral, Daily PRN   Multiple Vitamin (MULTIVITAMIN) capsule 1 capsule, Daily   omega-3 acid ethyl esters (LOVAZA) 1 g, Daily   ondansetron (ZOFRAN-ODT) 8 mg, Oral, Every 8 hours PRN   oxyCODONE-acetaminophen (PERCOCET/ROXICET) 5-325 MG tablet 1 tablet, Oral, Every 6 hours PRN   tamsulosin (FLOMAX) 0.4 mg, Oral, Daily       Objective:   Physical Exam BP 126/76   Pulse 86   Temp 98.2 F (36.8 C) (Oral)   Resp 18   Ht 5' 11.75" (1.822 m)   Wt (!) 310 lb 2 oz (140.7 kg)   SpO2 95%   BMI 42.35 kg/m  General:   Well developed, NAD, BMI noted. HEENT:  Normocephalic . Face symmetric, atraumatic Lungs:  CTA B Normal respiratory effort, no intercostal retractions, no accessory muscle use. Heart: RRR,  no murmur.  Lower extremities: no pretibial edema bilaterally  Skin: Not pale. Not jaundice Neurologic:  alert & oriented X3.  Speech normal, gait appropriate for age and unassisted Psych--  Cognition and judgment appear intact.  Cooperative with normal attention span and concentration.  Behavior appropriate. No anxious or depressed appearing.      Assessment    ASSESSMENT HTN Asthma GERD  Chronic sinusitis, Allergies Urolithiasis GI: --Recurrent  diverticulitis-- saw GI 09/11/2021, they rex conservative treatment for his sigmoid stricture d/t recurrent diverticulitis.  GB polyp, saw surgery/2018, surgery if polyp >  10 mm.  [place] 06/2021: Polyps stable, no further routine [place] needed Smoker: quit 08/2018 Chronic sinusitis: Seen by ENT 2018, Dr Christell Constant, rx medical treatment, no polyps OSA dx 07/2019 FH colon cancer, breast cancer Neuropathy, Burning feet syndrome, perioral numbness:  NCS 10-2020 , saw neuro, Rx gaba  PLAN: HTN: Started amlodipine at LOV, readings have been good per chart review however at home still getting readings in the  150/90.  Recommend no change for now, watch salt intake, bring his BP cuff at the next visit to compare it with ours. Neuropathy: At the last visit we increase gabapentin to 600 mg 3 times a day.  That has helped. Morbid obesity: Not making much progress, unable to exercise much, still bothered by planta fasciitis. Planta fasciitis, pes planus: To get orthotic insert today. kidney stones: R extracorporeal shockwave 03/22/2023, seen by urology 04/05/2023, blood work was done, he was told he was doing well.   RTC 4 months

## 2023-04-12 NOTE — Progress Notes (Signed)
 Patient presents today to pick up custom molded foot orthotics, diagnosed with PF by Dr. Ardelle Anton.   Orthotics were dispensed and fit was satisfactory. Reviewed instructions for break-in and wear. Written instructions given to patient.  Patient will follow up as needed.   Also notice right ankle has a great deal of over pronation  I discussed this with patient and showed him AFO's that could help to slow progression  He will think on it and if wants will come talk to Dr Ardelle Anton about it   Addison Bailey CPed, CFo, CFm

## 2023-04-12 NOTE — Patient Instructions (Signed)
  Check the  blood pressure regularly Blood pressure goal:  between 110/65 and  135/85. If it is consistently higher or lower, let me know      Please go to the front desk: Arrange for a office visit in 4 months

## 2023-04-14 NOTE — Assessment & Plan Note (Signed)
 HTN: Started amlodipine at LOV, readings have been good per chart review however at home still getting readings in the 150/90.  Recommend no change for now, watch salt intake, bring his BP cuff at the next visit to compare it with ours. Neuropathy: At the last visit we increase gabapentin to 600 mg 3 times a day.  That has helped. Morbid obesity: Not making much progress, unable to exercise much, still bothered by planta fasciitis. Planta fasciitis, pes planus: To get orthotic insert today. kidney stones: R extracorporeal shockwave 03/22/2023, seen by urology 04/05/2023, blood work was done, he was told he was doing well.   RTC 4 months

## 2023-06-10 ENCOUNTER — Encounter: Payer: Self-pay | Admitting: Cardiovascular Disease

## 2023-06-10 DIAGNOSIS — G4733 Obstructive sleep apnea (adult) (pediatric): Secondary | ICD-10-CM

## 2023-06-10 DIAGNOSIS — I10 Essential (primary) hypertension: Secondary | ICD-10-CM

## 2023-07-23 ENCOUNTER — Other Ambulatory Visit: Payer: Self-pay | Admitting: Internal Medicine

## 2023-08-16 ENCOUNTER — Ambulatory Visit: Admitting: Internal Medicine

## 2023-08-26 ENCOUNTER — Ambulatory Visit: Admitting: Internal Medicine

## 2023-10-18 ENCOUNTER — Ambulatory Visit (INDEPENDENT_AMBULATORY_CARE_PROVIDER_SITE_OTHER): Admitting: Internal Medicine

## 2023-10-18 ENCOUNTER — Encounter: Payer: Self-pay | Admitting: Internal Medicine

## 2023-10-18 VITALS — BP 136/76 | HR 72 | Temp 97.9°F | Resp 16 | Ht 71.75 in | Wt 258.1 lb

## 2023-10-18 DIAGNOSIS — R739 Hyperglycemia, unspecified: Secondary | ICD-10-CM | POA: Diagnosis not present

## 2023-10-18 DIAGNOSIS — G629 Polyneuropathy, unspecified: Secondary | ICD-10-CM

## 2023-10-18 DIAGNOSIS — G479 Sleep disorder, unspecified: Secondary | ICD-10-CM

## 2023-10-18 DIAGNOSIS — G4733 Obstructive sleep apnea (adult) (pediatric): Secondary | ICD-10-CM

## 2023-10-18 DIAGNOSIS — I10 Essential (primary) hypertension: Secondary | ICD-10-CM

## 2023-10-18 NOTE — Patient Instructions (Addendum)
 Go to the front desk for the checkout Please make an appointment for blood work next week, early in the morning Also make an appointment for a physical exam in about 4 months  You are doing great with your diet!!   Check the  blood pressure regularly Blood pressure goal:  between 110/65 and  135/85. If it is consistently higher or lower, let me know   HEALTHY SLEEP Sleep hygiene: Basic rules for a good night's sleep  Sleep only as much as you need to feel rested and then get out of bed  Keep a regular sleep schedule  Avoid forcing sleep  Exercise regularly for at least 20 minutes, preferably 4 to 5 hours before bedtime  Avoid caffeinated beverages after lunch  Avoid alcohol near bedtime: no night cap  Avoid smoking, especially in the evening  Do not go to bed hungry  Adjust bedroom environment  Avoid prolonged use of light-emitting screens before bedtime   Deal with your worries before bedtime   Melatonin 4 to 8 mg at bedtime every night   Please read more detailed instructions below   HYPERTENSION: Your blood pressure is well-managed with your current medication. -Continue taking amlodipine  5 mg daily. -We will order a CMP and CBC to monitor your condition.  HYPERGLYCEMIA: We noted elevated blood sugar levels in your chart. -We will order an A1c test to assess your current blood sugar control.  MORBID OBESITY DUE TO EXCESS CALORIES: You have lost over 50 pounds on a keto diet and are working towards losing more weight. -Continue with your keto diet and weight loss efforts.  OBSTRUCTIVE SLEEP APNEA: Your sleep apnea is well-managed with your CPAP machine. -Continue using your CPAP machine as directed.  SLEEP DISTURBANCE: You are experiencing sleep disturbances, likely due to job-related stress. Please read the deeps regards healthy sleep habits. You can take melatonin as well 4 to 8 mg at bedtime every night  POLYNEUROPATHY: You have ongoing neuropathy in your feet and  hands and concerns about your current medication. -Consider consulting with a neurologist for further evaluation of your neuropathy and concerns about gabapentin .

## 2023-10-18 NOTE — Progress Notes (Signed)
 Subjective:    Patient ID: Mario Jimenez, male    DOB: 1965/02/28, 58 y.o.   MRN: [id number]  DOS:  10/18/2023 Follow-up Discussed the use of AI scribe software for clinical note transcription with the patient, who gave verbal consent to proceed.  History of Present Illness  Peripheral neuropathy and neuropathic pain - Ongoing neuropathy in feet and hands with persistent nerve pain - Neuropathy symptoms have not improved despite losing over fifty pounds on a keto diet - Concerned about a potential association between gabapentin  and dementia, as mentioned by his sister - Interested in testing testosterone levels to explore potential benefits for neuropathy  Sleep disturbance - Consistently wakes up at 3 AM despite effective CPAP use, as indicated by high MyAir scores - Attributes sleep disturbances to job-related stress - Avoids Advil  or Aleve PM due to history of kidney stones - Not interested in sleeping pills  Hypertension and cardiovascular risk reduction - Takes amlodipine  for blood pressure control - Takes aspirin regularly  Weight management - Lost over fifty pounds on a keto diet - Aims to lose an additional thirty-five pounds   Wt Readings from Last 3 Encounters:  10/18/23 258 lb 2 oz (117.1 kg)  04/12/23 (!) 310 lb 2 oz (140.7 kg)  03/22/23 297 lb 6.4 oz (134.9 kg)     Review of Systems See above   Past Medical History:  Diagnosis Date   Allergy    Asthma mild   no inhaler   Gallbladder polyp 2018   6mm polyp   GERD (gastroesophageal reflux disease) occasional   watches diet and takes diet   Hemorrhoids    h/o Cscope    History of MRSA infection few yrs ago while in hospital in [place]    Meniscus tear 08/2010   to have surgery-- left knee   OSA on CPAP    Sleep apnea    Urolithiasis 2007-2008   ([place] ) left kidney  3 surgeries ; has passed stones since then     Past Surgical History:  Procedure Laterality Date    CYSTOSCOPY/RETROGRADE/URETEROSCOPY/STONE EXTRACTION WITH BASKET  2007-- x2   EXTRACORPOREAL SHOCK WAVE LITHOTRIPSY Right 03/22/2023   Procedure: RIGHT EXTRACORPOREAL SHOCK WAVE LITHOTRIPSY (ESWL);  Surgeon: Watt Rush, MD;  Location: [facility];  Service: Urology;  Laterality: Right;   KNEE ARTHROSCOPY  12/05/2010   Procedure: ARTHROSCOPY KNEE;  Surgeon: Lynwood SQUIBB Aplington;  Location: [facility];  Service: Orthopedics;  Laterality: Left;  LEFT KNEE ARTHROSCOPY WITH PARTIAL MEDIAL  MENISECTOMY, Total lateral menisectomy, Shaving of Medial Femoral condyle   leg surgery  1977- age 58   Left , s/p traction, body cast    Current Outpatient Medications  Medication Instructions   acetaminophen  (TYLENOL ) 650 mg, Every 6 hours PRN   amLODipine  (NORVASC ) 5 mg, Oral, Daily   aspirin EC 81 mg, Daily   co-enzyme Q-10 30 mg, Daily   famotidine  (PEPCID ) 10 mg, 2 times daily   FIBER PO 3 tablets, Daily   gabapentin  (NEURONTIN ) 600 MG tablet TAKE 1 TABLET(600 MG) BY MOUTH THREE TIMES DAILY   ketorolac  (TORADOL ) 10 mg, Oral, Every 6 hours PRN   meloxicam  (MOBIC ) 15 mg, Oral, Daily PRN   Multiple Vitamin (MULTIVITAMIN) capsule 1 capsule, Daily   omega-3 acid ethyl esters (LOVAZA) 1 g, Daily   ondansetron  (ZOFRAN -ODT) 8 mg, Oral, Every 8 hours PRN   oxyCODONE -acetaminophen  (PERCOCET/ROXICET) 5-325 MG tablet 1 tablet, Oral, Every 6 hours PRN   tamsulosin  (FLOMAX ) 0.4 mg, Oral,  Daily       Objective:   Physical Exam BP 136/76   Pulse 72   Temp 97.9 F (36.6 C) (Oral)   Resp 16   Ht 5' 11.75 (1.822 m)   Wt 258 lb 2 oz (117.1 kg)   SpO2 97%   BMI 35.25 kg/m  General:   Well developed, NAD, BMI noted. HEENT:  Normocephalic . Face symmetric, atraumatic Lungs:  CTA B Normal respiratory effort, no intercostal retractions, no accessory muscle use. Heart: RRR,  no murmur.  Lower extremities: no pretibial edema bilaterally  Skin: Not pale. Not jaundice Neurologic:  alert & oriented X3.   Speech normal, gait appropriate for age and unassisted Psych--  Cognition and judgment appear intact.  Cooperative with normal attention span and concentration.  Behavior appropriate. No anxious or depressed appearing.      Assessment     ASSESSMENT HTN Asthma GERD Chronic sinusitis, Allergies Morbid obesity: BMI 41 (2024 OSA dx 07/2019 Neuropathy, Burning feet syndrome, perioral numbness: Onset 2021, NCS 10-2020 , saw neuro, Rx gaba Urolithiasis GI: --Recurrent  diverticulitis-- saw GI 09/11/2021, they rex conservative treatment for his sigmoid stricture d/t recurrent diverticulitis.  GB polyp, saw surgery/2018, surgery if polyp >  10 mm.  US  06/2021: Polyps stable, no further routine US  needed Smoker: quit 08/2018 Chronic sinusitis: Seen by ENT 2018, Dr Georgina, rx medical treatment, no polyps FH colon cancer, breast cancer Assessment & Plan Hypertension Hypertension is well-managed on amlodipine  5 mg, check CMP and CBC. Hyperglycemia Hyperglycemia noted on chart review.  Check A1c. Morbid obesity due to excess calories Significant weight loss of over 50 pounds achieved through a keto diet. Continues to work towards further weight loss goals. Praised! Obstructive sleep apnea Obstructive sleep apnea managed with CPAP. Reports good compliance and high scores on CPAP usage. Sleep disturbance Reports waking up at 3 AM consistently. High stress levels due to work . Does not wish to take sleeping pills. Plan: Sleep tips provided, recommend melatonin 4 to 8 mg every night Neuropathy, burning feet syndrome: Chronic neuropathy affecting feet and hands. Concerns about gabapentin  potentially causing dementia, though this is not supported by current evidence to my knowledge. Discussed the possibility of consulting neurology   for further evaluation  and concerns about gabapentin  He also requested testosterone level, he is certain that that can be playing a role on neuropathy.  I think at  this unlikely but will check H/o urolithiasis Keeps oxycodone  and Toradol  at home for pain management if needed. Preventive care: Recommend a flu and a COVID-vaccine.  Declined RTC labs next week RTC CPX 4 months

## 2023-10-19 DIAGNOSIS — G4733 Obstructive sleep apnea (adult) (pediatric): Secondary | ICD-10-CM | POA: Insufficient documentation

## 2023-10-19 NOTE — Assessment & Plan Note (Signed)
 Hypertension Hypertension is well-managed on amlodipine  5 mg, check CMP and CBC. Hyperglycemia Hyperglycemia noted on chart review.  Check A1c. Morbid obesity due to excess calories Significant weight loss of over 50 pounds achieved through a keto diet. Continues to work towards further weight loss goals. Praised! Obstructive sleep apnea Obstructive sleep apnea managed with CPAP. Reports good compliance and high scores on CPAP usage. Sleep disturbance Reports waking up at 3 AM consistently. High stress levels due to work . Does not wish to take sleeping pills. Plan: Sleep tips provided, recommend melatonin 4 to 8 mg every night Neuropathy, burning feet syndrome: Chronic neuropathy affecting feet and hands. Concerns about gabapentin  potentially causing dementia, though this is not supported by current evidence to my knowledge. Discussed the possibility of consulting neurology   for further evaluation  and concerns about gabapentin  He also requested testosterone level, he is certain that that can be playing a role on neuropathy.  I think at this unlikely but will check H/o urolithiasis Keeps oxycodone  and Toradol  at home for pain management if needed. Preventive care: Recommend a flu and a COVID-vaccine.  Declined RTC labs next week RTC CPX 4 months

## 2023-10-28 ENCOUNTER — Other Ambulatory Visit

## 2023-11-11 ENCOUNTER — Other Ambulatory Visit

## 2023-11-20 NOTE — Addendum Note (Signed)
 Addended by: DORLENE CHIQUITA RAMAN on: 11/20/2023 11:59 AM   Modules accepted: Orders

## 2023-11-20 NOTE — Addendum Note (Signed)
 Addended by: ESTELLE GILLIS D on: 11/20/2023 01:36 PM   Modules accepted: Orders

## 2023-11-20 NOTE — Addendum Note (Signed)
 Addended by: ESTELLE GILLIS D on: 11/20/2023 01:32 PM   Modules accepted: Orders

## 2023-11-21 DIAGNOSIS — R739 Hyperglycemia, unspecified: Secondary | ICD-10-CM | POA: Diagnosis not present

## 2023-11-21 DIAGNOSIS — G629 Polyneuropathy, unspecified: Secondary | ICD-10-CM | POA: Diagnosis not present

## 2023-11-21 DIAGNOSIS — I10 Essential (primary) hypertension: Secondary | ICD-10-CM | POA: Diagnosis not present

## 2023-11-22 LAB — COMPREHENSIVE METABOLIC PANEL WITH GFR
ALT: 24 IU/L (ref 0–44)
AST: 30 IU/L (ref 0–40)
Albumin: 4.4 g/dL (ref 3.8–4.9)
Alkaline Phosphatase: 76 IU/L (ref 47–123)
BUN/Creatinine Ratio: 19 (ref 9–20)
BUN: 20 mg/dL (ref 6–24)
Bilirubin Total: 0.4 mg/dL (ref 0.0–1.2)
CO2: 25 mmol/L (ref 20–29)
Calcium: 9.2 mg/dL (ref 8.7–10.2)
Chloride: 102 mmol/L (ref 96–106)
Creatinine, Ser: 1.04 mg/dL (ref 0.76–1.27)
Globulin, Total: 2.8 g/dL (ref 1.5–4.5)
Glucose: 93 mg/dL (ref 70–99)
Potassium: 4.4 mmol/L (ref 3.5–5.2)
Sodium: 140 mmol/L (ref 134–144)
Total Protein: 7.2 g/dL (ref 6.0–8.5)
eGFR: 84 mL/min/1.73 (ref >59)

## 2023-11-22 LAB — CBC WITH DIFFERENTIAL/PLATELET
Basophils Absolute: 0 x10E3/uL (ref 0.0–0.2)
Basos: 0 %
EOS (ABSOLUTE): 0.2 x10E3/uL (ref 0.0–0.4)
Eos: 2 %
Hematocrit: 50.6 % (ref 37.5–51.0)
Hemoglobin: 16.5 g/dL (ref 13.0–17.7)
Immature Grans (Abs): 0 x10E3/uL (ref 0.0–0.1)
Immature Granulocytes: 0 %
Lymphocytes Absolute: 1.9 x10E3/uL (ref 0.7–3.1)
Lymphs: 23 %
MCH: 30 pg (ref 26.6–33.0)
MCHC: 32.6 g/dL (ref 31.5–35.7)
MCV: 92 fL (ref 79–97)
Monocytes Absolute: 0.5 x10E3/uL (ref 0.1–0.9)
Monocytes: 6 %
Neutrophils Absolute: 5.6 x10E3/uL (ref 1.4–7.0)
Neutrophils: 69 %
Platelets: 173 x10E3/uL (ref 150–450)
RBC: 5.5 x10E6/uL (ref 4.14–5.80)
RDW: 13 % (ref 11.6–15.4)
WBC: 8.2 x10E3/uL (ref 3.4–10.8)

## 2023-11-22 LAB — TESTOSTERONE: Testosterone: 226 ng/dL — ABNORMAL LOW (ref 264–916)

## 2023-11-22 LAB — HEMOGLOBIN A1C
Est. average glucose Bld gHb Est-mCnc: 100 mg/dL
Hgb A1c MFr Bld: 5.1 % (ref 4.8–5.6)

## 2023-11-27 NOTE — Telephone Encounter (Signed)
 Patient called today to ask for a Rx for a travel cpap. Per patient request he would like the Rx mailed to him. I will put the Rx in the mail today.

## 2023-11-29 ENCOUNTER — Ambulatory Visit: Payer: Self-pay

## 2023-11-29 DIAGNOSIS — R7989 Other specified abnormal findings of blood chemistry: Secondary | ICD-10-CM

## 2023-11-29 NOTE — Telephone Encounter (Signed)
 Most of his labs are normal.  Testosterone is low.  Recommend coming back in 2 weeks for confirmatory free testosterone test.  Morning labs again.  Please order and schedule.

## 2023-12-18 ENCOUNTER — Other Ambulatory Visit

## 2023-12-18 ENCOUNTER — Ambulatory Visit: Payer: Self-pay | Admitting: Family Medicine

## 2023-12-18 DIAGNOSIS — R7989 Other specified abnormal findings of blood chemistry: Secondary | ICD-10-CM | POA: Diagnosis not present

## 2023-12-18 LAB — TESTOSTERONE: Testosterone: 147.83 ng/dL — ABNORMAL LOW (ref 300.00–890.00)

## 2023-12-20 ENCOUNTER — Other Ambulatory Visit

## 2023-12-20 DIAGNOSIS — R7989 Other specified abnormal findings of blood chemistry: Secondary | ICD-10-CM

## 2023-12-25 ENCOUNTER — Ambulatory Visit: Payer: Self-pay | Admitting: Family Medicine

## 2023-12-25 DIAGNOSIS — R7989 Other specified abnormal findings of blood chemistry: Secondary | ICD-10-CM

## 2023-12-25 LAB — TESTOSTERONE, FREE: TESTOSTERONE FREE: 38.7 pg/mL — ABNORMAL LOW (ref 46.0–224.0)

## 2023-12-27 ENCOUNTER — Other Ambulatory Visit: Payer: Self-pay

## 2023-12-27 MED ORDER — GABAPENTIN 600 MG PO TABS: 600.0000 mg | ORAL_TABLET | Freq: Three times a day (TID) | ORAL | 1 refills | Status: AC

## 2024-01-10 ENCOUNTER — Telehealth: Payer: Self-pay

## 2024-01-10 NOTE — Telephone Encounter (Signed)
 Mario Jimenez

## 2024-01-17 ENCOUNTER — Ambulatory Visit: Admitting: Cardiology

## 2024-02-03 ENCOUNTER — Encounter: Payer: Self-pay | Admitting: Urology

## 2024-02-03 ENCOUNTER — Ambulatory Visit: Admitting: Urology

## 2024-02-03 ENCOUNTER — Ambulatory Visit (HOSPITAL_BASED_OUTPATIENT_CLINIC_OR_DEPARTMENT_OTHER)
Admission: RE | Admit: 2024-02-03 | Discharge: 2024-02-03 | Disposition: A | Discharge status: Home / Self Care | Source: Ambulatory Visit | Attending: Urology | Admitting: Urology

## 2024-02-03 VITALS — BP 144/78 | HR 80 | Ht 71.75 in | Wt 252.0 lb

## 2024-02-03 DIAGNOSIS — E291 Testicular hypofunction: Secondary | ICD-10-CM | POA: Diagnosis not present

## 2024-02-03 DIAGNOSIS — N200 Calculus of kidney: Secondary | ICD-10-CM | POA: Diagnosis not present

## 2024-02-03 LAB — CBC
Hematocrit: 51 % (ref 37.5–51.0)
Hemoglobin: 15.9 g/dL (ref 13.0–17.7)
MCH: 28.7 pg (ref 26.6–33.0)
MCHC: 31.2 g/dL — ABNORMAL LOW (ref 31.5–35.7)
MCV: 92 fL (ref 79–97)
Platelets: 188 x10E3/uL (ref 150–450)
RBC: 5.54 x10E6/uL (ref 4.14–5.80)
RDW: 12.8 % (ref 11.6–15.4)
WBC: 10.9 x10E3/uL — ABNORMAL HIGH (ref 3.4–10.8)

## 2024-02-03 LAB — PSA: Prostate Specific Ag, Serum: 1.3 ng/mL (ref 0.0–4.0)

## 2024-02-03 LAB — PROLACTIN: Prolactin: 10 ng/mL (ref 3.6–25.2)

## 2024-02-03 LAB — LUTEINIZING HORMONE: LH: 9.3 m[IU]/mL — ABNORMAL HIGH (ref 1.7–8.6)

## 2024-02-03 NOTE — Progress Notes (Signed)
 "  Assessment: 1. Hypogonadism in male   2. Nephrolithiasis     Plan: I personally reviewed the patient's chart including provider notes, lab results. Labs today:  CBC, LH, prolactin, PSA Treatment options for low testosterone  discussed including topical therapy, oral therapy, short acting injections, long-acting injections, and subcutaneous pellets.  Potential risk and side effects of testosterone  replacement therapy discussed. Will contact him with results and recommendations. KUB today for follow-up of nephrolithiasis.   Chief Complaint:  Chief Complaint  Patient presents with   Hypogonadism    History of Present Illness:  Mario Jimenez is a 59 y.o. male who is seen in consultation from Mabel Pry, DO for evaluation of low testosterone . He reports a decrease in his energy level, fatigue, decreased libido, erectile dysfunction, and loss of muscle mass.  No prior treatment for low testosterone . ADAM score = 8/10.  AM Testosterone  levels: 11/25 226 12/25 147.8  PSA results: 12/24 0.78  He has history of nephrolithiasis and is s/p USM with laser lithotripsy and ESL in 2007.  He had onset of intermittent left flank pain in April 2016.  CT showed bilateral non-obstructing stones with 3 stones in the left kidney with the largest 2 measuring about 8 mm and 3 stones in the right kidney measuring 4 mm.  He was seen by Dr. Tonda in [place] and elected to proceed with PCNL. Review of the operative note indicates that the procedure was aborted due to loss of access to the collecting system.  He underwent left ESL on 12/31/14.  CT scan from 06/05/2017 showed bilateral nonobstructing renal stones with the largest on the right measuring 3 mm and the largest in the left measuring 8 x 5 mm; no hydronephrosis.  A 4 mm stone was noted in the distal right ureter.  He reported passing the stone. At his visit in March 2022, he reported passing approximately 6 stones since his last  visit.  His last episode was in June 2021.  CT imaging from [facility] at that time showed a 10 mm nonobstructing stone in the lower pole of the left kidney, a 4 mm stone in the lower pole of the right kidney and a 4 mm proximal right ureteral stone without obstruction.  He reported passing the ureteral stone.   He was found to have a proximal right ureteral calculus in December 2024.  He underwent right ESL by Dr. Watt at that time.  No follow-up imaging since then. He has not had any recent stone symptoms.  Past Medical History:  Past Medical History:  Diagnosis Date   Allergy    Asthma mild   no inhaler   Gallbladder polyp 2018   6mm polyp   GERD (gastroesophageal reflux disease) occasional   watches diet and takes diet   Hemorrhoids    h/o Cscope    History of MRSA infection few yrs ago while in hospital in [place]    Meniscus tear 08/2010   to have surgery-- left knee   OSA on CPAP    Sleep apnea    Urolithiasis 2007-2008   ([place] ) left kidney  3 surgeries ; has passed stones since then     Past Surgical History:  Past Surgical History:  Procedure Laterality Date   CYSTOSCOPY/RETROGRADE/URETEROSCOPY/STONE EXTRACTION WITH BASKET  2007-- x2   EXTRACORPOREAL SHOCK WAVE LITHOTRIPSY Right 03/22/2023   Procedure: RIGHT EXTRACORPOREAL SHOCK WAVE LITHOTRIPSY (ESWL);  Surgeon: Watt Rush, MD;  Location: [facility];  Service: Urology;  Laterality: Right;   KNEE  ARTHROSCOPY  12/05/2010   Procedure: ARTHROSCOPY KNEE;  Surgeon: Lynwood SQUIBB Aplington;  Location: [facility];  Service: Orthopedics;  Laterality: Left;  LEFT KNEE ARTHROSCOPY WITH PARTIAL MEDIAL  MENISECTOMY, Total lateral menisectomy, Shaving of Medial Femoral condyle   leg surgery  1977- age 89   Left , s/p traction, body cast    Allergies:  Allergies[1]  Family History:  Family History  Problem Relation Age of Onset   Stroke Mother        M d/t brain tumor    Cancer Mother        brain, poss. glioblastoma   Coronary  artery disease Father        F, MI 1 y/o   Colon cancer Brother 40       dx 12-2020   Colon cancer Paternal Uncle        dx 38s-80s   Colon cancer Maternal Grandmother        dx 8-s   Diabetes Neg Hx    Prostate cancer Neg Hx    Esophageal cancer Neg Hx     Social History:  Social History[2]  Review of symptoms:  Constitutional:  Negative for unexplained weight loss, night sweats, fever, chills ENT:  Negative for nose bleeds, sinus pain, painful swallowing CV:  Negative for chest pain, shortness of breath, exercise intolerance, palpitations, loss of consciousness Resp:  Negative for cough, wheezing, shortness of breath GI:  Negative for nausea, vomiting, diarrhea, bloody stools GU:  Positives noted in HPI; otherwise negative for gross hematuria, dysuria, urinary incontinence Neuro:  Negative for seizures, poor balance, limb weakness, slurred speech Psych:  Negative for lack of energy, depression, anxiety Endocrine:  Negative for polydipsia, polyuria, symptoms of hypoglycemia (dizziness, hunger, sweating) Hematologic:  Negative for anemia, purpura, petechia, prolonged or excessive bleeding, use of anticoagulants  Allergic:  Negative for difficulty breathing or choking as a result of exposure to anything; no shellfish allergy; no allergic response (rash/itch) to materials, foods  Physical exam: BP (!) 144/78   Pulse 80   Ht 5' 11.75 (1.822 m)   Wt 252 lb (114.3 kg)   BMI 34.42 kg/m  GENERAL APPEARANCE:  Well appearing, well developed, well nourished, NAD HEENT: Atraumatic, Normocephalic, oropharynx clear. NECK: Supple without lymphadenopathy or thyromegaly. LUNGS: Clear to auscultation bilaterally. HEART: Regular Rate and Rhythm without murmurs, gallops, or rubs. ABDOMEN: Soft, non-tender, No Masses. EXTREMITIES: Moves all extremities well.  Without clubbing, cyanosis, or edema. NEUROLOGIC:  Alert and oriented x 3, normal gait, CN II-XII grossly intact.  MENTAL STATUS:   Appropriate. BACK:  Non-tender to palpation.  No CVAT SKIN:  Warm, dry and intact.   GU: declined by patient today  Results: None     [1]  Allergies Allergen Reactions   Contrast Media [Iodinated Contrast Media] Hives   Ioxaglate Hives    Contrast   Sulfa Antibiotics Hives  [2]  Social History Tobacco Use   Smoking status: Former    Current packs/day: 0.00    Types: Cigarettes    Quit date: 08/2018    Years since quitting: 5.4   Smokeless tobacco: Never   Tobacco comments:    Smoke on-off    1 ppd age 16 to 64 =16 pack-year    1 ppd [age] = 6 pack-year     Total: 22 pack-year    Quit 2020  Vaping Use   Vaping status: Never Used  Substance Use Topics   Alcohol use: Not Currently  Comment: None Since 2017 MB RN   Drug use: No   "

## 2024-02-06 ENCOUNTER — Ambulatory Visit: Payer: Self-pay | Admitting: Urology

## 2024-02-20 ENCOUNTER — Other Ambulatory Visit: Payer: Self-pay | Admitting: Urology

## 2024-02-21 ENCOUNTER — Encounter: Admitting: Internal Medicine

## 2024-02-21 ENCOUNTER — Other Ambulatory Visit: Payer: Self-pay | Admitting: Urology

## 2024-02-21 DIAGNOSIS — E291 Testicular hypofunction: Secondary | ICD-10-CM

## 2024-02-21 MED ORDER — TLANDO 112.5 MG PO CAPS: 2.0000 | ORAL_CAPSULE | Freq: Two times a day (BID) | ORAL | 5 refills | Status: AC

## 2024-05-08 ENCOUNTER — Encounter: Admitting: Internal Medicine
